# Patient Record
Sex: Male | Born: 1960 | Race: Black or African American | Hispanic: No | Smoking: Current every day smoker
Health system: Southern US, Community
[De-identification: ages and names within clinical notes are randomized; demographics above are authoritative.]

## PROBLEM LIST (undated history)

## (undated) DIAGNOSIS — K219 Gastro-esophageal reflux disease without esophagitis: Secondary | ICD-10-CM

## (undated) DIAGNOSIS — M25559 Pain in unspecified hip: Secondary | ICD-10-CM

## (undated) DIAGNOSIS — M1990 Unspecified osteoarthritis, unspecified site: Secondary | ICD-10-CM

## (undated) DIAGNOSIS — F319 Bipolar disorder, unspecified: Secondary | ICD-10-CM

## (undated) DIAGNOSIS — F209 Schizophrenia, unspecified: Secondary | ICD-10-CM

## (undated) DIAGNOSIS — F329 Major depressive disorder, single episode, unspecified: Secondary | ICD-10-CM

## (undated) DIAGNOSIS — M549 Dorsalgia, unspecified: Secondary | ICD-10-CM

## (undated) DIAGNOSIS — G8929 Other chronic pain: Secondary | ICD-10-CM

## (undated) DIAGNOSIS — J189 Pneumonia, unspecified organism: Secondary | ICD-10-CM

## (undated) DIAGNOSIS — I739 Peripheral vascular disease, unspecified: Secondary | ICD-10-CM

## (undated) DIAGNOSIS — Z720 Tobacco use: Secondary | ICD-10-CM

## (undated) DIAGNOSIS — F32A Depression, unspecified: Secondary | ICD-10-CM

## (undated) DIAGNOSIS — I7090 Unspecified atherosclerosis: Secondary | ICD-10-CM

## (undated) DIAGNOSIS — R51 Headache: Secondary | ICD-10-CM

## (undated) HISTORY — DX: Schizophrenia, unspecified: F20.9

## (undated) HISTORY — PX: EYE SURGERY: SHX253

## (undated) HISTORY — PX: MANDIBLE FRACTURE SURGERY: SHX706

## (undated) HISTORY — PX: JOINT REPLACEMENT: SHX530

## (undated) HISTORY — PX: FRACTURE SURGERY: SHX138

## (undated) HISTORY — PX: ESOPHAGEAL DILATION: SHX303

## (undated) HISTORY — DX: Bipolar disorder, unspecified: F31.9

## (undated) SURGERY — EGD (ESOPHAGOGASTRODUODENOSCOPY)
Anesthesia: Moderate Sedation

---

## 1998-09-25 ENCOUNTER — Encounter: Payer: Self-pay | Admitting: Emergency Medicine

## 1998-09-25 ENCOUNTER — Emergency Department (HOSPITAL_COMMUNITY): Admission: EM | Admit: 1998-09-25 | Discharge: 1998-09-25 | Payer: Self-pay | Admitting: Emergency Medicine

## 2000-09-23 ENCOUNTER — Emergency Department (HOSPITAL_COMMUNITY): Admission: EM | Admit: 2000-09-23 | Discharge: 2000-09-23 | Payer: Self-pay | Admitting: Emergency Medicine

## 2000-12-16 ENCOUNTER — Emergency Department (HOSPITAL_COMMUNITY): Admission: EM | Admit: 2000-12-16 | Discharge: 2000-12-16 | Payer: Self-pay | Admitting: Emergency Medicine

## 2002-02-01 ENCOUNTER — Inpatient Hospital Stay (HOSPITAL_COMMUNITY): Admission: EM | Admit: 2002-02-01 | Discharge: 2002-02-02 | Payer: Self-pay | Admitting: Emergency Medicine

## 2002-02-01 ENCOUNTER — Encounter: Payer: Self-pay | Admitting: Emergency Medicine

## 2002-09-23 ENCOUNTER — Encounter: Payer: Self-pay | Admitting: Emergency Medicine

## 2002-09-23 ENCOUNTER — Emergency Department (HOSPITAL_COMMUNITY): Admission: EM | Admit: 2002-09-23 | Discharge: 2002-09-23 | Payer: Self-pay | Admitting: Emergency Medicine

## 2003-01-01 ENCOUNTER — Emergency Department (HOSPITAL_COMMUNITY): Admission: EM | Admit: 2003-01-01 | Discharge: 2003-01-01 | Payer: Self-pay | Admitting: Emergency Medicine

## 2003-02-25 ENCOUNTER — Emergency Department (HOSPITAL_COMMUNITY): Admission: EM | Admit: 2003-02-25 | Discharge: 2003-02-25 | Payer: Self-pay | Admitting: Emergency Medicine

## 2004-07-27 ENCOUNTER — Emergency Department (HOSPITAL_COMMUNITY): Admission: EM | Admit: 2004-07-27 | Discharge: 2004-07-27 | Payer: Self-pay | Admitting: Emergency Medicine

## 2004-08-10 ENCOUNTER — Encounter: Admission: RE | Admit: 2004-08-10 | Discharge: 2004-11-08 | Payer: Self-pay | Admitting: Orthopedic Surgery

## 2008-01-27 ENCOUNTER — Emergency Department (HOSPITAL_COMMUNITY): Admission: EM | Admit: 2008-01-27 | Discharge: 2008-01-27 | Payer: Self-pay | Admitting: Emergency Medicine

## 2008-02-03 ENCOUNTER — Emergency Department (HOSPITAL_COMMUNITY): Admission: EM | Admit: 2008-02-03 | Discharge: 2008-02-03 | Payer: Self-pay | Admitting: Emergency Medicine

## 2008-04-18 ENCOUNTER — Emergency Department (HOSPITAL_COMMUNITY): Admission: EM | Admit: 2008-04-18 | Discharge: 2008-04-18 | Payer: Self-pay | Admitting: Emergency Medicine

## 2008-05-02 ENCOUNTER — Emergency Department (HOSPITAL_COMMUNITY): Admission: EM | Admit: 2008-05-02 | Discharge: 2008-05-02 | Payer: Self-pay | Admitting: Family Medicine

## 2008-06-03 ENCOUNTER — Ambulatory Visit: Payer: Self-pay | Admitting: Internal Medicine

## 2008-06-05 ENCOUNTER — Ambulatory Visit: Payer: Self-pay | Admitting: *Deleted

## 2008-07-09 ENCOUNTER — Ambulatory Visit: Payer: Self-pay | Admitting: Family Medicine

## 2008-08-12 ENCOUNTER — Ambulatory Visit: Payer: Self-pay | Admitting: Internal Medicine

## 2008-08-26 ENCOUNTER — Inpatient Hospital Stay (HOSPITAL_COMMUNITY): Admission: RE | Admit: 2008-08-26 | Discharge: 2008-08-30 | Payer: Self-pay | Admitting: Orthopaedic Surgery

## 2009-03-18 ENCOUNTER — Ambulatory Visit: Payer: Self-pay | Admitting: Internal Medicine

## 2009-03-20 ENCOUNTER — Ambulatory Visit (HOSPITAL_COMMUNITY): Admission: RE | Admit: 2009-03-20 | Discharge: 2009-03-20 | Payer: Self-pay | Admitting: Internal Medicine

## 2009-04-02 ENCOUNTER — Ambulatory Visit: Payer: Self-pay | Admitting: Internal Medicine

## 2009-04-02 LAB — CONVERTED CEMR LAB
CO2: 27 meq/L (ref 19–32)
Cholesterol: 182 mg/dL (ref 0–200)
Creatinine, Ser: 1.48 mg/dL (ref 0.40–1.50)
Eosinophils Absolute: 0.1 10*3/uL (ref 0.0–0.7)
Eosinophils Relative: 2 % (ref 0–5)
Glucose, Bld: 85 mg/dL (ref 70–99)
HCT: 45.1 % (ref 39.0–52.0)
LDL Cholesterol: 121 mg/dL — ABNORMAL HIGH (ref 0–99)
MCHC: 33.3 g/dL (ref 30.0–36.0)
Monocytes Absolute: 0.6 10*3/uL (ref 0.1–1.0)
Neutro Abs: 4.2 10*3/uL (ref 1.7–7.7)
Platelets: 237 10*3/uL (ref 150–400)
Potassium: 4 meq/L (ref 3.5–5.3)
RBC: 4.72 M/uL (ref 4.22–5.81)
RDW: 14.1 % (ref 11.5–15.5)
Sodium: 142 meq/L (ref 135–145)

## 2009-04-09 ENCOUNTER — Ambulatory Visit: Payer: Self-pay | Admitting: Internal Medicine

## 2009-05-26 ENCOUNTER — Ambulatory Visit: Payer: Self-pay | Admitting: Internal Medicine

## 2010-04-17 LAB — BASIC METABOLIC PANEL
CO2: 26 mEq/L (ref 19–32)
CO2: 27 mEq/L (ref 19–32)
CO2: 31 mEq/L (ref 19–32)
Calcium: 8.7 mg/dL (ref 8.4–10.5)
Chloride: 103 mEq/L (ref 96–112)
Chloride: 104 mEq/L (ref 96–112)
Creatinine, Ser: 1.33 mg/dL (ref 0.4–1.5)
Creatinine, Ser: 1.47 mg/dL (ref 0.4–1.5)
Creatinine, Ser: 1.55 mg/dL — ABNORMAL HIGH (ref 0.4–1.5)
GFR calc Af Amer: >60 mL/min (ref >60)
GFR calc Af Amer: >60 mL/min (ref >60)
GFR calc non Af Amer: 45 mL/min — ABNORMAL LOW (ref >60)
GFR calc non Af Amer: 48 mL/min — ABNORMAL LOW (ref >60)
Glucose, Bld: 140 mg/dL — ABNORMAL HIGH (ref 70–99)
Potassium: 3.8 mEq/L (ref 3.5–5.1)
Potassium: 4.1 mEq/L (ref 3.5–5.1)
Sodium: 142 mEq/L (ref 135–145)

## 2010-04-17 LAB — URINALYSIS, ROUTINE W REFLEX MICROSCOPIC
Glucose, UA: NEGATIVE mg/dL
Nitrite: NEGATIVE
Specific Gravity, Urine: 1.019 (ref 1.005–1.030)
pH: 6 (ref 5.0–8.0)

## 2010-04-17 LAB — CBC
HCT: 33.1 % — ABNORMAL LOW (ref 39.0–52.0)
HCT: 37.7 % — ABNORMAL LOW (ref 39.0–52.0)
MCHC: 34.3 g/dL (ref 30.0–36.0)
MCHC: 34.3 g/dL (ref 30.0–36.0)
MCHC: 34.4 g/dL (ref 30.0–36.0)
MCV: 98.9 fL (ref 78.0–100.0)
Platelets: 264 10*3/uL (ref 150–400)
RBC: 3.81 MIL/uL — ABNORMAL LOW (ref 4.22–5.81)
RBC: 4.12 MIL/uL — ABNORMAL LOW (ref 4.22–5.81)
RDW: 15.1 % (ref 11.5–15.5)
WBC: 11.6 10*3/uL — ABNORMAL HIGH (ref 4.0–10.5)
WBC: 7.2 10*3/uL (ref 4.0–10.5)
WBC: 8.4 10*3/uL (ref 4.0–10.5)

## 2010-04-17 LAB — APTT: aPTT: 29 seconds (ref 24–37)

## 2010-04-17 LAB — PROTIME-INR
INR: 3.4 — ABNORMAL HIGH (ref 0.00–1.49)
Prothrombin Time: 18.8 seconds — ABNORMAL HIGH (ref 11.6–15.2)
Prothrombin Time: 34.3 seconds — ABNORMAL HIGH (ref 11.6–15.2)

## 2010-04-17 LAB — GLUCOSE, CAPILLARY: Glucose-Capillary: 102 mg/dL — ABNORMAL HIGH (ref 70–99)

## 2010-05-25 NOTE — Discharge Summary (Signed)
NAMERAPHEAL, MASSO             ACCOUNT NO.:  [id number]   MEDICAL RECORD NO.:  [id number]          PATIENT TYPE:  INP   LOCATION:  5033                         FACILITY:  [facility]   PHYSICIAN:  Vanita Panda. Magnus Ivan, M.D.DATE OF BIRTH:  1960/01/12   DATE OF ADMISSION:  08/26/2008  DATE OF DISCHARGE:  08/30/2008                               DISCHARGE SUMMARY   ADMITTING DIAGNOSIS:  Avascular necrosis, right hip.   DISCHARGE DIAGNOSIS:  Avascular necrosis, right hip.   PROCEDURE:  Right total hip arthroplasty on August 26, 2008.   HOSPITAL COURSE:  Mr. Vancott is a 50 year old gentleman with severe  avascular necrosis involving his right hip with collapse of the femoral  head.  He had debilitating pain in his hip with difficulty with  ambulating and activities of daily living.  It was recommended that he  undergo a total hip arthroplasty.  The risks and benefits of the surgery  were explained to him in detailed.  He was taken to the operating room  on the day admission where he underwent a right total hip replacement  without complications.   His postoperative course.  He was admitted to orthopedic floor bed and  had a complicated postoperative convalescence in an acute care bed.  By  day of discharge, he was tolerating oral pain medicines as well as oral  diet.  He was ambulating well and then cleared from a standpoint.  It  was felt, he could be discharged safely to home.   DISPOSITION:  To home.   DISCHARGE INSTRUCTIONS:  While he is at home, he will continue to weight  bear as tolerated on both of his hips with anterior to anterior hip  precautions.  Home health will come and work with therapy as well as lab  draws for his Coumadin dosing.   FOLLOWUP:  Will be reestablished in my office in 2 weeks.   DISCHARGE MEDICATIONS:  1. Percocet.  2. Robaxin.  3. Integralin.      Vanita Panda. Magnus Ivan, M.D.  Electronically Signed     CYB/MEDQ  D:  08/30/2008  T:   08/30/2008  Job:  [id number]

## 2010-05-25 NOTE — Op Note (Signed)
James, Stevens             ACCOUNT NO.:  [id number]   MEDICAL RECORD NO.:  [id number]          PATIENT TYPE:  INP   LOCATION:  5033                         FACILITY:  [facility]   PHYSICIAN:  Vanita Panda. Magnus Ivan, M.D.DATE OF BIRTH:  1960/06/17   DATE OF PROCEDURE:  08/26/2008  DATE OF DISCHARGE:                               OPERATIVE REPORT   PREOPERATIVE DIAGNOSIS:  Right hip avascular necrosis.   POSTOPERATIVE DIAGNOSIS:  Right hip avascular necrosis.   PROCEDURE:  Right total hip arthroplasty.   IMPLANTS:  DePuy Pinnacle acetabular component size 52 with 3 screw  holes and 1 screw placed, size 36 +1.5 hip ball, Corail DePuy  hydroxyapatite-coated femoral stem with a high offset size 12.   SURGEON:  Vanita Panda. Magnus Ivan, MD   ASSISTANT:  Wende Neighbors, PA-C   ANESTHESIA:  General.   ANTIBIOTICS:  Ancef 1 g IV.   BLOOD LOSS:  300 mL.   COMPLICATIONS:  None.   INDICATIONS:  Briefly, James Stevens is a 50 year old gentleman with  bipolar disorder and who was likely an alcoholic at one point.  He  developed debilitating hip pain for the last 3 years involving his right  hip.  X-rays were obtained and were consistent with severe avascular  necrosis.  He was seen by another surgeon in town who had wanted to send  him to [facility] for definitive  treatment.  He came to me asking if he could just have this treatment  done in town.  I talked to him in length about a hip replacement surgery  and what this would involve giving his severe avascular necrosis that is  at end stage.  I explained the risks and benefits of surgery as well as  pluses and minuses of the bearing surfaces.  We selected to proceed with  total hip arthroplasty.   PROCEDURE:  After informed consent was obtained and appropriate right  hip was marked, he was brought to the operating room, placed supine on  the operating table.  General anesthesia was then  obtained.  A Foley  catheter was placed, and he was turned into a lateral decubitus position  with the right operative hip up with appropriate padding under the down  nonoperative leg as well as his arms and axillary roll was placed.  Appropriate positioning was placed for his head and neck.  His right  operative extremity was then prepped and draped with DuraPrep and  sterile drapes including sterile leg bag.  A time-out was called to  identify the correct patient and correct right hip.  I then made a small  incision directly over the greater trochanter, carried this proximally  and distally.  I was able to dissect down to the iliotibial band.  I did  divide this in a longitudinal fashion, and a Charnley retractor was  placed.  Thus, when I pursued an anterolateral approach to the hip.  I  took down approximately two-thirds of gluteus medius and minimus tendons  off of the greater trochanter and reflected these anteriorly.  I was  able to easily dissect  down to the hip capsule.  I divided the hip  capsule in a T-type format and placed tacking sutures in the capsule.  I  then was able to easily dislocate the hip and the leg was placed in the  anterior leg bag.  I then made a saw cut approximately a fingerbreadth  proximal to the greater trochanter and removed the head.  The head was  very much diseased with loss of cartilage and flattening and soft bone.  I then cleaned the acetabular debris and placed Hohmann retractors  anterior and posterior.  I removed the labrum as well.  I then started  with a size 45 reamer and reamed medially after reams of 45 and 47 and  as I said we had got into the teardrop area.  I then directed my reamers  sequentially next with the appropriate version placed.  I reamed up to a  size 52 and I trialed a 52 acetabular component.  I then placed the real  Pinnacle Septra cup and placed 1 screw hole.  I did feel this cup bottom  out and was felt to be in a good  position with appropriate version.  I  got good bite with the single screw that was placed in a posterior-  superior direction.  Next, the size 52 metal liner was placed for metal-  on-metal component.  Attention was then turned to the femoral component.  A retractor was placed underneath the greater trochanter and the leg was  flexed and anteriorly rotated off the table into the leg bag.  I used  the cookie cutter to enter the femoral canal as well as the canal  finding guide and applied lateralizing reamer one time.  I then chose  the Corail Broaching System and I was able to broach from size 8 up to a  size 12.  The size 12 was felt to be appropriate and I selected my  version as well.  I then trialed a size 36 +1.5 hip ball and reduced  this into the acetabulum.  It had no shuck and excellent fit and range  of motion.  I could not dislocate them anteriorly as well and this did  not felt too tight.  His leg lengths were felt about to be equal.  I  then trialed once hip ball size up and one below this to get an idea of  leg lengths and I still felt this was appropriate length of the 1.5 and  36 head.  I then removed all trial components until I fully irrigated  the tissues.  I placed the real Corail size 12 HA-coated stem with a  collar.  I then trialed the 1.5 size 36 hip ball again and this was felt  to have a secure fit, so I placed the real hip ball and reduced this  into the acetabulum.  Again, range of motion was felt to be full.  I  could not dislocate them posteriorly or anteriorly, especially  anteriorly without much external rotation.  Again, the leg lengths were  felt to be equal.  We then thoroughly irrigated the wounds.  I closed  the hip capsule with interrupted #1 Ethibond suture.  I reapproximated  the gluteus medius and minimus to the vastus lateralis and oversewed  this with #1 Ethibond suture as well.  The iliotibial band was closed  with interrupted 4-0 nylon with  interrupted #1 Vicryl suture, followed  by this with 0 Vicryl in the deep  tissue, 2-0 Vicryl in the subcutaneous  tissue and running 4-0 Vicryl subcuticular stitch.  Steri-Strips were  placed as well, and a well-padded sterile dressing was applied.  The  patient was then rolled in a supine position, awakened, and extubated.  He was taken to the recovery room in stable condition.      Vanita Panda. Magnus Ivan, M.D.  Electronically Signed     CYB/MEDQ  D:  08/26/2008  T:  08/27/2008  Job:  [id number]

## 2010-05-28 NOTE — Op Note (Signed)
NAME:  James Stevens, James Stevens                       ACCOUNT NO.:  [id number]   MEDICAL RECORD NO.:  [id number]                   PATIENT TYPE:  INP   LOCATION:  1825                                 FACILITY:  [facility]   PHYSICIAN:  Hinton Dyer, D.D.S.            DATE OF BIRTH:  [birth year]   DATE OF PROCEDURE:  DATE OF DISCHARGE:                                 OPERATIVE REPORT   PREOPERATIVE DIAGNOSES:  Fracture of right condylar neck of mandible.   POSTOPERATIVE DIAGNOSES:  Fracture of right condylar neck of mandible.   PROCEDURE:  Closed reduction of fracture of right condylar neck of mandible  with Erich arch bars and intermaxillary fixation.   ANESTHESIA:  General.   SURGEON:  Royston Sinner   ESTIMATED BLOOD LOSS:  Less than 10 mL.   CONDITION IN SURGERY:  Good.   BRIEF HISTORY:  The patient allegedly fell this afternoon and had trouble  opening his mouth after that. He was seen in the [facility] emergency room  and x-rays revealed the fracture with displacement of the right condylar  neck of the mandible. He was worked up and then brought to the operating  room and placed in the supine position, where he remained throughout the  whole procedure. He was then intubated by right nasal endotracheal tube and  then prepped and draped in the usual fashion for an intraoral procedure.  After prepping the face and mouth with Betadine scrub and paint, and the  throat pack was placed around the endotracheal tube, the maxillary arch was  measured from tooth #3 to tooth #14. It was noted that he had marked  periodontal disease, although the teeth were all intact, so care was taken  to use 24 gauge stainless steel wires, around teeth #3, 4, 5, 6, 7, 8, 9,  10, 11, 12, 13 and 14 and around the [place] arch bar. They were turned in a  clockwise fashion and rosetted down into the mucobuccal fold. This  stabilized the maxillary arch bar and attention was then turned to the  mandible, where  tooth #19 through 30 were measured and then [facility] arch bar  cut to fit the arch, 24 gauge stainless steel wire was passed around teeth  #19, 20, 21, 22, 23, 24, 25, 26, 27, 28, 29, and 30 and then around the  [place] arch bar. These wires were then rosetted down in a clockwise direction  and down into the mucobuccal fold. The Erich arch bar was stabilized and  then the throat pack was removed. The mouth was irrigated out and suctioned,  and the patient was placed in what appeared to be his most appropriate  occlusion. It was found that he was in a class III malocclusion but the  teeth were interdigitated appropriately. Four maxillary fixation wires were  used to hold and stabilize the maximal width of the mandible and the  mandible was manipulated so as  to reduce the fracture as best as possible.  The wires were then cut, turned clockwise, and then he was extubated on the  table and returned to recovery room in good condition. He will be seen by me  later tomorrow morning and be discharged home as appropriate.                                                Hinton Dyer, D.D.S.   Lynden Ang  D:  02/01/2002  T:  02/02/2002  Job:  [id number]

## 2010-06-28 ENCOUNTER — Inpatient Hospital Stay (HOSPITAL_COMMUNITY)
Admission: EM | Admit: 2010-06-28 | Discharge: 2010-06-29 | DRG: 153 | Disposition: A | Discharge status: Home / Self Care | Payer: Medicaid Other | Attending: Internal Medicine | Admitting: Internal Medicine

## 2010-06-28 ENCOUNTER — Encounter (HOSPITAL_COMMUNITY): Payer: Self-pay | Admitting: Radiology

## 2010-06-28 ENCOUNTER — Emergency Department (HOSPITAL_COMMUNITY): Payer: Medicaid Other

## 2010-06-28 DIAGNOSIS — M1990 Unspecified osteoarthritis, unspecified site: Secondary | ICD-10-CM | POA: Diagnosis present

## 2010-06-28 DIAGNOSIS — F209 Schizophrenia, unspecified: Secondary | ICD-10-CM | POA: Diagnosis present

## 2010-06-28 DIAGNOSIS — Z96649 Presence of unspecified artificial hip joint: Secondary | ICD-10-CM

## 2010-06-28 DIAGNOSIS — F17200 Nicotine dependence, unspecified, uncomplicated: Secondary | ICD-10-CM | POA: Diagnosis present

## 2010-06-28 DIAGNOSIS — J039 Acute tonsillitis, unspecified: Principal | ICD-10-CM | POA: Diagnosis present

## 2010-06-28 DIAGNOSIS — E860 Dehydration: Secondary | ICD-10-CM | POA: Diagnosis present

## 2010-06-28 DIAGNOSIS — Z23 Encounter for immunization: Secondary | ICD-10-CM

## 2010-06-28 DIAGNOSIS — F1410 Cocaine abuse, uncomplicated: Secondary | ICD-10-CM | POA: Diagnosis present

## 2010-06-28 DIAGNOSIS — J36 Peritonsillar abscess: Secondary | ICD-10-CM | POA: Diagnosis present

## 2010-06-28 LAB — DIFFERENTIAL
Eosinophils Relative: 1 % (ref 0–5)
Lymphocytes Relative: 17 % (ref 12–46)
Lymphs Abs: 2.2 10*3/uL (ref 0.7–4.0)
Monocytes Relative: 10 % (ref 3–12)
Neutrophils Relative %: 72 % (ref 43–77)

## 2010-06-28 LAB — POCT I-STAT, CHEM 8
BUN: 20 mg/dL (ref 6–23)
Calcium, Ion: 1.12 mmol/L (ref 1.12–1.32)
Chloride: 103 mEq/L (ref 96–112)
Glucose, Bld: 85 mg/dL (ref 70–99)
HCT: 53 % — ABNORMAL HIGH (ref 39.0–52.0)
Potassium: 4.3 mEq/L (ref 3.5–5.1)

## 2010-06-28 LAB — CBC
HCT: 47.3 % (ref 39.0–52.0)
Hemoglobin: 17 g/dL (ref 13.0–17.0)
MCV: 95.9 fL (ref 78.0–100.0)
WBC: 13 10*3/uL — ABNORMAL HIGH (ref 4.0–10.5)

## 2010-06-28 MED ORDER — IOHEXOL 300 MG/ML  SOLN
80.0000 mL | Freq: Once | INTRAMUSCULAR | Status: AC | PRN
  Administered 2010-06-28: 80 mL via INTRAVENOUS

## 2010-06-29 LAB — CBC
HCT: 41.1 % (ref 39.0–52.0)
MCH: 33.3 pg (ref 26.0–34.0)
MCV: 94.9 fL (ref 78.0–100.0)
RBC: 4.33 MIL/uL (ref 4.22–5.81)
WBC: 7.6 10*3/uL (ref 4.0–10.5)

## 2010-06-29 LAB — BASIC METABOLIC PANEL
BUN: 16 mg/dL (ref 6–23)
CO2: 25 mEq/L (ref 19–32)
Chloride: 105 mEq/L (ref 96–112)
Creatinine, Ser: 1.04 mg/dL (ref 0.50–1.35)
Glucose, Bld: 96 mg/dL (ref 70–99)

## 2010-06-29 NOTE — H&P (Signed)
[name], James Stevens             ACCOUNT NO.:  [id number]  MEDICAL RECORD NO.:  [id number]  LOCATION:  [facility]                         FACILITY:  [facility]  PHYSICIAN:  Jeoffrey Massed, MD    DATE OF BIRTH:  07/08/1960  DATE OF ADMISSION:  06/28/2010 DATE OF DISCHARGE:                             HISTORY & PHYSICAL   PRIMARY CARE PHYSICIAN:  Fleet Contras, MD  CHIEF COMPLAINT:  Fever and sore throat for 4-5 days.  HISTORY OF PRESENT ILLNESS:  The patient is a very pleasant 50 year old male with a history of schizophrenia, history of tobacco abuse, history of on and off cocaine use as well comes in the above-noted complaints. Per the patient, he started having some sore throat last Friday.  Over the course of the weekend, it gradually worsened to the extent that he was not able to swallow regular food and was drinking mostly liquids. He also noted that he was having subjective fever with chills.  Since his difficulty swallowing progressed, he presented to the ED today for further evaluation.  In the ED, further workup was done which included a CT of the neck, which shows a left-sided tonsillitis with a peritonsillar abscess.  The ED physician and the PA did talk to ENT on call, Dr. Jearld Fenton who requested a medical admission and he suggested trial of antibiotics before considering drainage of the abscess as the abscess was deep.  Dr. Jearld Fenton will evaluate formally in the morning.  Currently during my evaluation, the patient claims that he already feels much better after getting IV antibiotics in the ED.  He claims that he is able to open his mouth much more than what he was able to do with this morning.  He is able to control his secretions.  There is no stridor. He is not short of breath.  He has been afebrile here in the emergency room as well.  The hospitalist service is now admitting this patient for IV antibiotics.  ALLERGIES:  Questionable allergy to IBUPROFEN.  PAST MEDICAL  HISTORY: 1. Avascular necrosis of his right hip status post hip repair, a hip     replacement. 2. Degenerative joint disease. 3. Schizophrenia, maintained on Seroquel. 4. Tobacco abuse. 5. On and off cocaine abuse.  PAST SURGICAL HISTORY:  Status post right hip arthroplasty in 2010, done by Dr. Doneen Poisson.  FAMILY HISTORY:  Noncontributory to current condition.  MEDICATIONS AT HOME:  Seroquel at an unknown dose.  SOCIAL HISTORY:  Lives by himself, smokes around half a pack of cigarettes a day.  Smokes crack cocaine once in a while.  Socially, drinks alcohol.  REVIEW OF SYSTEMS:  A detailed review of 12 systems was done and these are negative except for the ones mentioned in the HPI.  PHYSICAL EXAMINATION:  GENERAL:  The patient lying in bed, does not appear to be in any distress.  Speech clear.  No stridor.  No drooling. He is awake and alert. HEENT: Atraumatic, normocephalic.  Pupils equally reactive to light and accommodation.  Oral cavity, the patient does have some mild-to-moderate to marked trismus but claims that he is able to open his mouth much more.  Evaluation of the oral cavity shows  a numerous dental caries. There is marked erythema in the bilateral tonsillar area.  Unable to evaluate much more than that because of the patient's trismus.  No stridor heard. NECK:  Supple. CHEST:  Bilaterally clear to auscultation. CARDIOVASCULAR:  Heart sounds are regular.  No murmurs heard. ABDOMEN:  Soft, nontender, nondistended. EXTREMITIES:  No edema. NEUROLOGIC:  The patient is awake and alert and has no focal neurological deficits.  LABORATORY DATA: 1. CBC shows a WBC of 13, hemoglobin of 17, hematocrit of 47.3,     platelet count of 202.  Differential shows atypical lymphocytes. 2. Chemistry shows a sodium of 138, potassium of 4.3, creatinine of     1.40, BUN of 20, glucose of 85, chloride of 103. 3. [place] strep screen is negative. 4. Mono screen is negative  as well.  RADIOLOGICAL STUDIES:  CT of the neck shows enlarged left tonsillar pillar containing crescentic fluid collection with surrounding hyperenhancement and left level II lymphadenopathy.  Fevers, infectious tonsillitis with intratonsillar abscess.  Hyper enhancement of the left submandibular gland, fever-associated silo adenitis.  Subtle hypodensity surrounding the dystrophic calcification to the right tonsil may reflect early suppuration as well.  ASSESSMENT: 1. Left-sided tonsillitis with a possible peritonsillar abscess. 2. Ongoing tobacco abuse. 3. History of cocaine abuse. 4. History of schizophrenia, currently stable.  PLAN: 1. The patient be admitted to regular floor. 2. We will start him on IV Unasyn and clindamycin for now and can be     further narrowed as the patient's clinical situation improves. 3. Currently, the patient does not have any drooling.  The patient     does not have any stridor on exam.  He appears easily able to protect      his airway.  He will be admitted to the medical service for IV      antibiotics with ENT consulting in the morning.      Per my conversation with the emergency department     physician assistant who actually talked to Dr. Jearld Fenton, ENT on-call     recommendations are for a trial of IV antibiotics as the     abscess is deep and if it does not respond to IV antibiotics, then     a drainage will be pursued. 4. The patient will be monitored closely and his clinical course will     be followed to see if he responds to IV antibiotics or not. 5. Await ENT evaluation for further recommendations. 6. Substance abuse and tobacco cessation consultation will be     obtained.  I have also counselled him extensively and the patient     claims he will see if he can quit. 7. Code status.  Full code. 8. DVT prophylaxis with Lovenox.  Total time spent equals 60 minutes.     Jeoffrey Massed, MD     SG/MEDQ  D:  06/28/2010  T:  06/28/2010   Job:  [id number]  cc:   Fleet Contras, M.D.  Electronically Signed by Jeoffrey Massed  on 06/29/2010 06:10:51 AM

## 2010-06-30 LAB — DRUGS OF ABUSE SCREEN W/O ALC, ROUTINE URINE
Benzodiazepines.: NEGATIVE
Cocaine Metabolites: POSITIVE — AB
Methadone: NEGATIVE
Opiate Screen, Urine: POSITIVE — AB
Phencyclidine (PCP): NEGATIVE

## 2010-07-02 NOTE — Discharge Summary (Signed)
NAMEMARGARET, STAGGS             ACCOUNT NO.:  [id number]  MEDICAL RECORD NO.:  [id number]  LOCATION:  5506                         FACILITY:  [facility]  PHYSICIAN:  Thad Ranger, MD       DATE OF BIRTH:  1960/04/27  DATE OF ADMISSION:  06/28/2010 DATE OF DISCHARGE:  06/29/2010                        DISCHARGE SUMMARY - REFERRING   PRIMARY CARE PHYSICIAN:  Fleet Contras, MD  DISCHARGE DIAGNOSES: 1. Left-sided tonsillitis with possible peritonsillar abscess. 2. Dehydration. 3. History of schizophrenia. 4. History of substance abuse. 5. History of nicotine abuse. 6. History of degenerative joint disease. 7. History of avascular necrosis of the right hip, status post hip     repair.  CONSULTATIONS:  Suzanna Obey, MD, ENT  DISCHARGE MEDICATIONS: 1. Augmentin 875 mg 1 tablet p.o. b.i.d. for 14 days. 2. Vicodin 5/325 mg 1-2 tablets every 6 hours as needed for pain. 3. Seroquel 300 mg 0.5 tablet at noon and 1.5 tablet at bedtime twice    daily.  BRIEF HISTORY OF PRESENT ILLNESS:  At the time of admission, Mr. PACHOLSKI is a 50 year old African American with history of schizophrenia, tobacco abuse, history of off and on cocaine use, presented with fever and sore throat for 4-5 days.  The patient stated that he was started having some sort throat for 4 days and over the course of weekend, it gradually worsened to the extent that he was not able to swallow regular food and was drinking mostly liquid.  He also noted to have some subjective fevers with chills as his difficulty swallowing progress he presented to the emergency room.  In the ED, further workup was done which included CT of the neck which showed left- sided tonsillitis with peritonsillar abscess.  The patient was admitted by the Hospitalist Service.  RADIOLOGICAL DATA: 1. CT of the neck and one enlarged left tonsillar pillar containing     the crescentic fluid collection with surrounding hyperenhancement     in the left  level 2.  Lymphadenopathy, favor infectious colitis     with left intratonsillar abscess.  No parapharyngeal or     retropharyngeal space involvement.  If clinical findings do not     support infection, necrotic tumor should be considered. 2. Hyperenhancement of the left mandibular gland 3. Subtle hypodensity surrounding and dystrophic calcification in the     right tonsil may reflect only suppuration as well.  PERTINENT LAB DIAGNOSTIC DATA:  Rapid strep screen was negative.  CBC at the time of admission showed white count 13.0, hemoglobin 17, hematocrit 47.3, platelets 202.  CBC at the time of discharge showed white count 7.6, hemoglobin 14.4, hematocrit 41.1, and platelets 216.  BRIEF HOSPITALIZATION COURSE:  Mr. Fini is a 50 year old who presented with left-sided tonsillitis with peritonsillar abscess. 1. Left-sided tonsillitis with possible peritonsillar abscess.  The     patient was admitted to the medical floor.  He was started on IV     Unasyn and clindamycin.  ENT consultation was obtained.  The     patient did not have any stridor on exam or any drooling and was     protecting his airway.  The patient was seen by Dr. Jearld Fenton from ENT  Service today and per recommendation felt that the patient may have     spontaneously ruptured and he was taking p.o. well.  From her ENT     recommendations, the patient was cleared to be discharged home with     antibiotic course.  The patient will follow up with Dr. Jearld Fenton in 7     days in his office.  The patient has been taking liquid diet very     well and will be started on mechanical soft diet.  He was counseled     strongly to avoid raw fruit,vegetables and salads for the next few days.  The     patient will be discharged home today after the current antibiotic     pack is finished.  PHYSICAL EXAMINATION:  VITAL SIGNS:  At the time of dictation, temperature 97.9, pulse 62, respirations 22, and blood pressure 106/70. GENERAL:  He is  alert, awake and oriented x3, not in any acute distress. HEENT:  Anicteric.  Conjunctivae normal.  PERRLA.  Pupils reactive to light and accommodation.  EOMI. NECK:  Supple. CHEST:  Clear to auscultation bilaterally. ABDOMEN:  Soft, nontender, nondistended, normal bowel sounds. EXTREMITIES:  No edema.  DISCHARGE FOLLOWUP:  Dr. Suzanna Obey, ENT within 7 days.  Discharge time 35 minutes.     Thad Ranger, MD     RR/MEDQ  D:  06/29/2010  T:  06/29/2010  Job:  [id number]  cc:   Fleet Contras, M.D. Suzanna Obey, M.D.  Electronically Signed by Andres Labrum Ashawnti Tangen  on 07/02/2010 05:10:02 PM

## 2010-07-05 LAB — COCAINE, URINE, CONFIRMATION: Benzoylecgonine GC/MS Conf: 6718 NG/ML — ABNORMAL HIGH

## 2010-07-05 LAB — OPIATE, QUANTITATIVE, URINE
Codeine Urine: NEGATIVE NG/ML
Oxycodone, ur: NEGATIVE NG/ML
Oxymorphone: NEGATIVE NG/ML

## 2010-11-26 ENCOUNTER — Ambulatory Visit: Payer: Medicaid Other | Admitting: Physical Therapy

## 2011-06-28 ENCOUNTER — Other Ambulatory Visit: Payer: Self-pay | Admitting: Gastroenterology

## 2011-06-28 DIAGNOSIS — R1310 Dysphagia, unspecified: Secondary | ICD-10-CM

## 2011-07-06 ENCOUNTER — Inpatient Hospital Stay: Admission: RE | Admit: 2011-07-06 | Payer: Medicaid Other | Source: Ambulatory Visit

## 2011-07-15 ENCOUNTER — Ambulatory Visit
Admission: RE | Admit: 2011-07-15 | Discharge: 2011-07-15 | Disposition: A | Discharge status: Home / Self Care | Payer: Medicaid Other | Source: Ambulatory Visit | Attending: Gastroenterology | Admitting: Gastroenterology

## 2011-07-15 DIAGNOSIS — R1310 Dysphagia, unspecified: Secondary | ICD-10-CM

## 2011-08-23 ENCOUNTER — Emergency Department (HOSPITAL_COMMUNITY)
Admission: EM | Admit: 2011-08-23 | Discharge: 2011-08-23 | Disposition: A | Discharge status: Home / Self Care | Payer: Medicaid Other | Attending: Emergency Medicine | Admitting: Emergency Medicine

## 2011-08-23 ENCOUNTER — Encounter (HOSPITAL_COMMUNITY): Payer: Self-pay | Admitting: *Deleted

## 2011-08-23 DIAGNOSIS — IMO0002 Reserved for concepts with insufficient information to code with codable children: Secondary | ICD-10-CM

## 2011-08-23 DIAGNOSIS — Z96649 Presence of unspecified artificial hip joint: Secondary | ICD-10-CM | POA: Insufficient documentation

## 2011-08-23 DIAGNOSIS — S61209A Unspecified open wound of unspecified finger without damage to nail, initial encounter: Secondary | ICD-10-CM | POA: Insufficient documentation

## 2011-08-23 DIAGNOSIS — F17200 Nicotine dependence, unspecified, uncomplicated: Secondary | ICD-10-CM | POA: Insufficient documentation

## 2011-08-23 DIAGNOSIS — W260XXA Contact with knife, initial encounter: Secondary | ICD-10-CM | POA: Insufficient documentation

## 2011-08-23 DIAGNOSIS — W261XXA Contact with sword or dagger, initial encounter: Secondary | ICD-10-CM | POA: Insufficient documentation

## 2011-08-23 NOTE — ED Notes (Signed)
Accidentally cut left thumb when sharping a knife.  No bleeding

## 2011-08-23 NOTE — ED Provider Notes (Signed)
History    This chart was scribed for Rolan Bucco, MD, MD by Smitty Pluck. The patient was seen in room [facility] and the patient's care was started at 7:00PM.   CSN: [id number]  Arrival date & time 08/23/11  1633   First MD Initiated Contact with Patient 08/23/11 1859      Chief Complaint  Patient presents with  . Laceration    left thumb    (Consider location/radiation/quality/duration/timing/severity/associated sxs/prior treatment) Patient is a 51 y.o. male presenting with skin laceration. The history is provided by the patient.  Laceration    James Stevens is a 51 y.o. male who presents to the Emergency Department complaining of moderate left thumb laceration today at 5PM. Pt reports using knife and accidentally cutting his thumb. Bleeding is controlled. Denies any radiation. Denies any other pain.   History reviewed. No pertinent past medical history.  Past Surgical History  Procedure Date  . Joint replacement     rt hip replacement    No family history on file.  History  Substance Use Topics  . Smoking status: Current Everyday Smoker  . Smokeless tobacco: Not on file  . Alcohol Use: Yes     occ      Review of Systems  Constitutional: Negative for fever.  HENT: Negative for neck pain.   Respiratory: Negative.   Cardiovascular: Negative.   Gastrointestinal: Negative for nausea and vomiting.  Musculoskeletal: Negative for back pain and joint swelling.  Skin: Negative for wound.  Neurological: Negative for headaches.    Allergies  Ibuprofen and Tramadol  Home Medications   Current Outpatient Rx  Name Route Sig Dispense Refill  . HYDROCODONE-ACETAMINOPHEN 10-325 MG PO TABS Oral Take 1 tablet by mouth every 8 (eight) hours as needed. For pain      BP 109/79  Pulse 79  Temp 97.3 F (36.3 C) (Oral)  Resp 16  Ht 5\' 10"  (1.778 m)  Wt 147 lb (66.679 kg)  BMI 21.09 kg/m2  SpO2 98%  Physical Exam  Nursing note and vitals reviewed. Constitutional:  He is oriented to person, place, and time. He appears well-developed and well-nourished.  HENT:  Head: Normocephalic and atraumatic.  Neck: Normal range of motion. Neck supple.       No pain to neck or back  Musculoskeletal: He exhibits no edema and no tenderness.       1 cm superficial laceration to volar surface of thumb Overlying distal phalanx Able to flex and extend normally Normal sensation and cap refill is normal   Neurological: He is alert and oriented to person, place, and time.    ED Course  LACERATION REPAIR Date/Time: 08/23/2011 7:42 PM Performed by: Atalya Dano Authorized by: Rolan Bucco Consent: Verbal consent obtained. Consent given by: patient Body area: upper extremity Location details: left thumb Foreign bodies: no foreign bodies Tendon involvement: none Nerve involvement: none Vascular damage: no Irrigation solution: saline Amount of cleaning: standard Skin closure: glue Approximation: close Approximation difficulty: simple   (including critical care time) DIAGNOSTIC STUDIES: Oxygen Saturation is 98% on room air, normal by my interpretation.    COORDINATION OF CARE: 7:02PM EDP discusses pt ED treatment with pt     Labs Reviewed - No data to display No results found.   1. Laceration       MDM  Pt with superficial laceration to thumb.  TDAP UTD, given wound care instructions      I personally performed the services described in this documentation, which  was scribed in my presence.  The recorded information has been reviewed and considered.    Rolan Bucco, MD 08/23/11 [phone]

## 2011-08-23 NOTE — ED Notes (Signed)
The patient is AOx4 and comfortable with his discharge instructions. 

## 2011-09-06 ENCOUNTER — Encounter (HOSPITAL_COMMUNITY): Payer: Self-pay | Admitting: *Deleted

## 2011-09-06 ENCOUNTER — Ambulatory Visit (HOSPITAL_COMMUNITY): Payer: Medicaid Other

## 2011-09-06 ENCOUNTER — Encounter (HOSPITAL_COMMUNITY)
Admission: RE | Disposition: A | Discharge status: Home / Self Care | Payer: Self-pay | Source: Ambulatory Visit | Attending: Gastroenterology

## 2011-09-06 ENCOUNTER — Ambulatory Visit (HOSPITAL_COMMUNITY)
Admission: RE | Admit: 2011-09-06 | Discharge: 2011-09-06 | Disposition: A | Discharge status: Home / Self Care | Payer: Medicaid Other | Source: Ambulatory Visit | Attending: Gastroenterology | Admitting: Gastroenterology

## 2011-09-06 DIAGNOSIS — F17200 Nicotine dependence, unspecified, uncomplicated: Secondary | ICD-10-CM | POA: Insufficient documentation

## 2011-09-06 DIAGNOSIS — R1310 Dysphagia, unspecified: Secondary | ICD-10-CM | POA: Insufficient documentation

## 2011-09-06 DIAGNOSIS — Z96649 Presence of unspecified artificial hip joint: Secondary | ICD-10-CM | POA: Insufficient documentation

## 2011-09-06 HISTORY — DX: Other chronic pain: G89.29

## 2011-09-06 HISTORY — PX: SAVORY DILATION: SHX5439

## 2011-09-06 HISTORY — PX: ESOPHAGOGASTRODUODENOSCOPY: SHX5428

## 2011-09-06 HISTORY — DX: Dorsalgia, unspecified: M54.9

## 2011-09-06 SURGERY — EGD (ESOPHAGOGASTRODUODENOSCOPY)
Anesthesia: Moderate Sedation

## 2011-09-06 MED ORDER — BUTAMBEN-TETRACAINE-BENZOCAINE 2-2-14 % EX AERO
INHALATION_SPRAY | CUTANEOUS | Status: DC | PRN
  Administered 2011-09-06: 2 via TOPICAL

## 2011-09-06 MED ORDER — FENTANYL CITRATE 0.05 MG/ML IJ SOLN
INTRAMUSCULAR | Status: DC | PRN
  Administered 2011-09-06 (×2): 25 ug via INTRAVENOUS

## 2011-09-06 MED ORDER — MIDAZOLAM HCL 10 MG/2ML IJ SOLN
INTRAMUSCULAR | Status: DC | PRN
  Administered 2011-09-06: 2 mg via INTRAVENOUS
  Administered 2011-09-06: 1 mg via INTRAVENOUS
  Administered 2011-09-06: 2 mg via INTRAVENOUS

## 2011-09-06 MED ORDER — MIDAZOLAM HCL 10 MG/2ML IJ SOLN
INTRAMUSCULAR | Status: AC
  Filled 2011-09-06: qty 4

## 2011-09-06 MED ORDER — FENTANYL CITRATE 0.05 MG/ML IJ SOLN
INTRAMUSCULAR | Status: AC
  Filled 2011-09-06: qty 4

## 2011-09-06 MED ORDER — DIPHENHYDRAMINE HCL 50 MG/ML IJ SOLN
INTRAMUSCULAR | Status: AC
  Filled 2011-09-06: qty 1

## 2011-09-06 MED ORDER — SODIUM CHLORIDE 0.9 % IV SOLN
INTRAVENOUS | Status: DC
  Administered 2011-09-06: 09:00:00 via INTRAVENOUS

## 2011-09-06 NOTE — H&P (Signed)
  Subjective:   Patient is a 51 y.o. male presents with dysphagia and trouble swallowing for dilatation. Procedure including risks and benefits discussed in office.  There are no active problems to display for this patient.  Past Medical History  Diagnosis Date  . Chronic back pain     and right hip    Past Surgical History  Procedure Date  . Joint replacement     rt hip replacement  . Mandible fracture surgery     Prescriptions prior to admission  Medication Sig Dispense Refill  . HYDROcodone-acetaminophen (NORCO) 10-325 MG per tablet Take 1 tablet by mouth every 8 (eight) hours as needed. For pain       Allergies  Allergen Reactions  . Ibuprofen   . Tramadol     History  Substance Use Topics  . Smoking status: Current Everyday Smoker -- 0.5 packs/day  . Smokeless tobacco: Not on file  . Alcohol Use: 1.8 oz/week    3 Cans of beer per week     3x a week     History reviewed. No pertinent family history.   Objective:   Patient Vitals for the past 8 hrs:  BP Temp Temp src Resp SpO2 Height Weight  09/06/11 0841 124/72 mmHg 97.8 F (36.6 C) Oral 18  98 % 5\' 10"  (1.778 m) 67.132 kg (148 lb)         See MD Preop evaluation      Assesment: Esophageal stricture  Plan:   For dilatation discussed in the office.

## 2011-09-06 NOTE — Op Note (Signed)
[facility] [address] [place], [address]   ENDOSCOPY PROCEDURE REPORT  PATIENT: James Stevens, James Stevens.  MR#: [id number] BIRTHDATE: 1960-11-20 , 51  yrs. old GENDER: Male ENDOSCOPIST:Markese Bloxham Randa Evens, MD REFERRED BY: Fleet Contras PROCEDURE DATE:  09/06/2011 PROCEDURE:   EGD, diagnostic and Savary dilation of esophagus ASA CLASS:    Class II INDICATIONS: dysphagia. MEDICATION: Fentanyl 50 mcg IV, Versed 5 mg IV, and Cetacaine spray x 2 2 TOPICAL ANESTHETIC:   Cetacaine Spray  DESCRIPTION OF PROCEDURE:   After the risks and benefits of the procedure were explained, informed consent was obtained.  The Pentax Gastroscope [id number]  endoscope was introduced through the mouth  and advanced to the second portion of the duodenum .  The instrument was slowly withdrawn as the mucosa was fully examined.      ESOPHAGUS: The mucosa of the esophagus appeared normal.  Minimal stricture.   this was dilated using fluoroscopic guidance with 14 mm dilator. Small amount heme with withdrawal of the styloid or.  STOMACH: The mucosa of the stomach appeared normal.  DUODENUM: The duodenal mucosa showed no abnormalities. Retroflexed views revealed no abnormalities.    The scope was then withdrawn from the patient and the procedure completed.  COMPLICATIONS: There were no complications.   ENDOSCOPIC IMPRESSION: 1.   The mucosa of the esophagus appeared normal 2.   Minimal stricture,dilated to 14 mm using fluoroscopic guidance  RECOMMENDATIONS: routine postdilatation orders with clear liquids for 6 hours and resume soft diet tomorrow.  If the patient has any further problems he will call the office.  He will continue current medications.    _______________________________ Rosalie DoctorCarman Ching, MD 09/06/2011 9:56 AM   standard discharge clear liquids et Karie Soda for 6 hours soft food tomorrow. CC: Dr Tyson Dense

## 2011-09-07 ENCOUNTER — Encounter (HOSPITAL_COMMUNITY): Payer: Self-pay | Admitting: Gastroenterology

## 2011-09-07 ENCOUNTER — Encounter (HOSPITAL_COMMUNITY): Payer: Self-pay

## 2011-09-08 ENCOUNTER — Emergency Department (HOSPITAL_COMMUNITY): Payer: Medicaid Other

## 2011-09-08 ENCOUNTER — Encounter (HOSPITAL_COMMUNITY): Payer: Self-pay

## 2011-09-08 ENCOUNTER — Emergency Department (HOSPITAL_COMMUNITY)
Admission: EM | Admit: 2011-09-08 | Discharge: 2011-09-08 | Disposition: A | Discharge status: Home / Self Care | Payer: Medicaid Other | Attending: Emergency Medicine | Admitting: Emergency Medicine

## 2011-09-08 DIAGNOSIS — S0100XA Unspecified open wound of scalp, initial encounter: Secondary | ICD-10-CM | POA: Insufficient documentation

## 2011-09-08 DIAGNOSIS — T07XXXA Unspecified multiple injuries, initial encounter: Secondary | ICD-10-CM

## 2011-09-08 DIAGNOSIS — G8929 Other chronic pain: Secondary | ICD-10-CM | POA: Insufficient documentation

## 2011-09-08 DIAGNOSIS — F17200 Nicotine dependence, unspecified, uncomplicated: Secondary | ICD-10-CM | POA: Insufficient documentation

## 2011-09-08 MED ORDER — OXYCODONE-ACETAMINOPHEN 5-325 MG PO TABS
1.0000 | ORAL_TABLET | Freq: Once | ORAL | Status: AC
  Administered 2011-09-08: 1 via ORAL
  Filled 2011-09-08: qty 1

## 2011-09-08 MED ORDER — LIDOCAINE HCL 2 % IJ SOLN
5.0000 mL | Freq: Once | INTRAMUSCULAR | Status: AC
  Administered 2011-09-08: 5 mL via INTRADERMAL
  Filled 2011-09-08: qty 1

## 2011-09-08 MED ORDER — PERCOCET 5-325 MG PO TABS: 1.0000 | ORAL_TABLET | Freq: Four times a day (QID) | ORAL | Status: DC | PRN

## 2011-09-08 NOTE — ED Notes (Signed)
[employer] at bedside at this time. 

## 2011-09-08 NOTE — ED Notes (Signed)
Lisette, PA in to suture.
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
## 2011-09-08 NOTE — ED Notes (Signed)
Lisette, PA back in to finish suturing

## 2011-09-08 NOTE — ED Notes (Signed)
Pt taken to xray 

## 2011-09-09 NOTE — ED Provider Notes (Signed)
Medical screening examination/treatment/procedure(s) were performed by non-physician practitioner and as supervising physician I was immediately available for consultation/collaboration.  Jones Skene, M.D.     Jones Skene, MD 09/09/11 1610

## 2011-09-13 ENCOUNTER — Encounter (HOSPITAL_COMMUNITY): Payer: Self-pay | Admitting: *Deleted

## 2011-09-13 ENCOUNTER — Emergency Department (HOSPITAL_COMMUNITY)
Admission: EM | Admit: 2011-09-13 | Discharge: 2011-09-13 | Discharge status: Against Medical Advice | Payer: Medicaid Other | Attending: Emergency Medicine | Admitting: Emergency Medicine

## 2011-09-13 DIAGNOSIS — M79609 Pain in unspecified limb: Secondary | ICD-10-CM | POA: Insufficient documentation

## 2011-09-13 DIAGNOSIS — M7989 Other specified soft tissue disorders: Secondary | ICD-10-CM | POA: Insufficient documentation

## 2011-09-13 NOTE — ED Notes (Signed)
Pt stated he couldn't wait any more and would come back another time, risks explained to patient. Pt ambulated to exit. No distress noted.
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
## 2011-09-19 ENCOUNTER — Emergency Department (HOSPITAL_COMMUNITY)
Admission: EM | Admit: 2011-09-19 | Discharge: 2011-09-19 | Disposition: A | Discharge status: Home / Self Care | Payer: Medicaid Other | Attending: Emergency Medicine | Admitting: Emergency Medicine

## 2011-09-19 ENCOUNTER — Emergency Department (INDEPENDENT_AMBULATORY_CARE_PROVIDER_SITE_OTHER)
Admission: EM | Admit: 2011-09-19 | Discharge: 2011-09-19 | Disposition: A | Discharge status: Home / Self Care | Payer: Medicaid Other | Source: Home / Self Care | Attending: Emergency Medicine | Admitting: Emergency Medicine

## 2011-09-19 ENCOUNTER — Encounter (HOSPITAL_COMMUNITY): Payer: Self-pay | Admitting: *Deleted

## 2011-09-19 DIAGNOSIS — F17200 Nicotine dependence, unspecified, uncomplicated: Secondary | ICD-10-CM | POA: Insufficient documentation

## 2011-09-19 DIAGNOSIS — S61409A Unspecified open wound of unspecified hand, initial encounter: Secondary | ICD-10-CM

## 2011-09-19 DIAGNOSIS — S61412A Laceration without foreign body of left hand, initial encounter: Secondary | ICD-10-CM

## 2011-09-19 DIAGNOSIS — Y849 Medical procedure, unspecified as the cause of abnormal reaction of the patient, or of later complication, without mention of misadventure at the time of the procedure: Secondary | ICD-10-CM | POA: Insufficient documentation

## 2011-09-19 DIAGNOSIS — T8133XA Disruption of traumatic injury wound repair, initial encounter: Secondary | ICD-10-CM | POA: Insufficient documentation

## 2011-09-19 DIAGNOSIS — G8929 Other chronic pain: Secondary | ICD-10-CM | POA: Insufficient documentation

## 2011-09-19 DIAGNOSIS — IMO0002 Reserved for concepts with insufficient information to code with codable children: Secondary | ICD-10-CM

## 2011-09-19 DIAGNOSIS — Z4802 Encounter for removal of sutures: Secondary | ICD-10-CM

## 2011-09-19 MED ORDER — DOXYCYCLINE HYCLATE 100 MG PO CAPS: 100.0000 mg | ORAL_CAPSULE | Freq: Two times a day (BID) | ORAL | Status: AC

## 2011-09-19 MED ORDER — BACITRACIN 500 UNIT/GM EX OINT
1.0000 "application " | TOPICAL_OINTMENT | Freq: Two times a day (BID) | CUTANEOUS | Status: DC
  Administered 2011-09-19: 1 via TOPICAL

## 2011-09-19 NOTE — ED Notes (Signed)
Sutures removed from left fingers, and hand soaked in clorahexadine
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
## 2011-09-19 NOTE — ED Provider Notes (Signed)
History     CSN: [id number]  Arrival date & time 09/19/11  1217   First MD Initiated Contact with Patient 09/19/11 1309      Chief Complaint  Patient presents with  . Suture / Staple Removal  . Wound Check    (Consider location/radiation/quality/duration/timing/severity/associated sxs/prior treatment) HPI  Past Medical History  Diagnosis Date  . Chronic back pain     and right hip    Past Surgical History  Procedure Date  . Joint replacement     rt hip replacement  . Mandible fracture surgery   . Esophagogastroduodenoscopy 09/06/2011    Procedure: ESOPHAGOGASTRODUODENOSCOPY (EGD);  Surgeon: Vertell Novak., MD;  Location: [facility];  Service: Endoscopy;  Laterality: N/A;  . Savory dilation 09/06/2011    Procedure: SAVORY DILATION;  Surgeon: Vertell Novak., MD;  Location: [facility];  Service: Endoscopy;  Laterality: N/A;  . Esophageal dilation     History reviewed. No pertinent family history.  History  Substance Use Topics  . Smoking status: Current Every Day Smoker -- 0.5 packs/day  . Smokeless tobacco: Not on file  . Alcohol Use: 1.8 oz/week    3 Cans of beer per week     3x a week       Review of Systems  Allergies  Ibuprofen and Tramadol  Home Medications   Current Outpatient Rx  Name Route Sig Dispense Refill  . DOXYCYCLINE HYCLATE 100 MG PO CAPS Oral Take 1 capsule (100 mg total) by mouth 2 (two) times daily. 20 capsule 0    BP 112/70  Pulse 89  Temp 98.1 F (36.7 C) (Oral)  Resp 16  SpO2 100%  Physical Exam  ED Course  Procedures (including critical care time)  Labs Reviewed - No data to display No results found.   1. Laceration of left hand with complication   2. Laceration of finger of left hand with complication   3. Encounter for staple removal       MDM          Linna Hoff, MD 09/23/11 1334
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
## 2011-09-19 NOTE — ED Notes (Signed)
Suture removal to left hand/fingers/stapel removal to head

## 2011-10-04 ENCOUNTER — Encounter (HOSPITAL_COMMUNITY): Payer: Self-pay | Admitting: Emergency Medicine

## 2011-10-04 ENCOUNTER — Emergency Department (HOSPITAL_COMMUNITY)
Admission: EM | Admit: 2011-10-04 | Discharge: 2011-10-05 | Disposition: A | Discharge status: Home / Self Care | Payer: Medicaid Other | Attending: Emergency Medicine | Admitting: Emergency Medicine

## 2011-10-04 DIAGNOSIS — X58XXXA Exposure to other specified factors, initial encounter: Secondary | ICD-10-CM | POA: Insufficient documentation

## 2011-10-04 DIAGNOSIS — T148XXA Other injury of unspecified body region, initial encounter: Secondary | ICD-10-CM

## 2011-10-04 DIAGNOSIS — Z96649 Presence of unspecified artificial hip joint: Secondary | ICD-10-CM | POA: Insufficient documentation

## 2011-10-04 DIAGNOSIS — S61412A Laceration without foreign body of left hand, initial encounter: Secondary | ICD-10-CM

## 2011-10-04 DIAGNOSIS — S61409A Unspecified open wound of unspecified hand, initial encounter: Secondary | ICD-10-CM | POA: Insufficient documentation

## 2011-10-04 DIAGNOSIS — F17200 Nicotine dependence, unspecified, uncomplicated: Secondary | ICD-10-CM | POA: Insufficient documentation

## 2011-10-04 NOTE — ED Notes (Signed)
PT. REPORTS NON HEALING LACERATIONS AT LEFT 4TH AND 5TH FINGER SUTURED HERE LAST AUG. 28 , STATES DRAINAGE WITH NUMBNESS .

## 2011-10-05 MED ORDER — SULFAMETHOXAZOLE-TRIMETHOPRIM 800-160 MG PO TABS: 1.0000 | ORAL_TABLET | Freq: Two times a day (BID) | ORAL | Status: DC

## 2011-10-05 MED ORDER — HYDROCODONE-ACETAMINOPHEN 5-325 MG PO TABS
1.0000 | ORAL_TABLET | Freq: Once | ORAL | Status: AC
  Administered 2011-10-05: 1 via ORAL
  Filled 2011-10-05: qty 1

## 2011-10-05 MED ORDER — HYDROCODONE-ACETAMINOPHEN 10-325 MG PO TABS: 1.0000 | ORAL_TABLET | Freq: Four times a day (QID) | ORAL | Status: DC | PRN

## 2011-10-05 MED ORDER — CEPHALEXIN 500 MG PO CAPS: 500.0000 mg | ORAL_CAPSULE | Freq: Four times a day (QID) | ORAL | Status: DC

## 2011-10-05 NOTE — ED Provider Notes (Signed)
Medical screening examination/treatment/procedure(s) were performed by non-physician practitioner and as supervising physician I was immediately available for consultation/collaboration.    Jentry Warnell D Michaeljohn Biss, MD 10/05/11 1603 
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
## 2012-01-20 ENCOUNTER — Emergency Department (HOSPITAL_COMMUNITY)
Admission: EM | Admit: 2012-01-20 | Discharge: 2012-01-20 | Disposition: A | Discharge status: Home / Self Care | Payer: Medicaid Other | Attending: Emergency Medicine | Admitting: Emergency Medicine

## 2012-01-20 ENCOUNTER — Emergency Department (HOSPITAL_COMMUNITY): Payer: Medicaid Other

## 2012-01-20 ENCOUNTER — Encounter (HOSPITAL_COMMUNITY): Payer: Self-pay | Admitting: Emergency Medicine

## 2012-01-20 DIAGNOSIS — F17200 Nicotine dependence, unspecified, uncomplicated: Secondary | ICD-10-CM | POA: Insufficient documentation

## 2012-01-20 DIAGNOSIS — Z9889 Other specified postprocedural states: Secondary | ICD-10-CM | POA: Insufficient documentation

## 2012-01-20 DIAGNOSIS — Z96649 Presence of unspecified artificial hip joint: Secondary | ICD-10-CM | POA: Insufficient documentation

## 2012-01-20 DIAGNOSIS — M7660 Achilles tendinitis, unspecified leg: Secondary | ICD-10-CM

## 2012-01-20 DIAGNOSIS — M25559 Pain in unspecified hip: Secondary | ICD-10-CM | POA: Insufficient documentation

## 2012-01-20 DIAGNOSIS — R011 Cardiac murmur, unspecified: Secondary | ICD-10-CM | POA: Insufficient documentation

## 2012-01-20 DIAGNOSIS — M25579 Pain in unspecified ankle and joints of unspecified foot: Secondary | ICD-10-CM | POA: Insufficient documentation

## 2012-01-20 DIAGNOSIS — G8929 Other chronic pain: Secondary | ICD-10-CM | POA: Insufficient documentation

## 2012-01-20 DIAGNOSIS — M549 Dorsalgia, unspecified: Secondary | ICD-10-CM | POA: Insufficient documentation

## 2012-01-20 MED ORDER — HYDROCODONE-ACETAMINOPHEN 5-325 MG PO TABS: 2.0000 | ORAL_TABLET | ORAL | Status: DC | PRN

## 2012-01-20 MED ORDER — OXYCODONE-ACETAMINOPHEN 5-325 MG PO TABS
1.0000 | ORAL_TABLET | Freq: Once | ORAL | Status: AC
  Administered 2012-01-20: 1 via ORAL
  Filled 2012-01-20: qty 1

## 2012-01-20 MED ORDER — KETOROLAC TROMETHAMINE 60 MG/2ML IM SOLN
60.0000 mg | Freq: Once | INTRAMUSCULAR | Status: AC
Start: 2012-01-20 — End: 2012-01-20
  Administered 2012-01-20: 60 mg via INTRAMUSCULAR
  Filled 2012-01-20: qty 2

## 2012-01-20 MED ORDER — NAPROXEN 500 MG PO TABS: 500.0000 mg | ORAL_TABLET | Freq: Two times a day (BID) | ORAL | Status: DC

## 2012-01-20 NOTE — ED Notes (Signed)
Toe hurting x 4 days when he fell this am it got worse

## 2012-01-20 NOTE — ED Notes (Signed)
Patient transported to X-ray 

## 2012-01-20 NOTE — ED Notes (Signed)
C/o severe right 5th toe pain. No deformity noted.

## 2012-01-20 NOTE — ED Notes (Signed)
Larey Seat this am and hurt  Rt little toe and hip pt can walk w/ cane has of replacement on rt side

## 2012-01-20 NOTE — ED Notes (Signed)
Patient provided sandwich and orange juice.

## 2012-01-20 NOTE — ED Notes (Signed)
Pt transported to xray 

## 2012-01-20 NOTE — ED Notes (Signed)
Pt b

## 2012-01-20 NOTE — ED Provider Notes (Signed)
History   This chart was scribed for non-physician practitioner working with James Roots, MD by Frederik Pear, ED Scribe. This patient was seen in room TR06C/TR06C and the patient's care was started at 1533.   CSN: [id number]  Arrival date & time 01/20/12  1313   First MD Initiated Contact with Patient 01/20/12 1533      No chief complaint on file.   (Consider location/radiation/quality/duration/timing/severity/associated sxs/prior treatment) Patient is a 52 y.o. male presenting with toe pain.  Toe Pain This is a new problem. The current episode started more than 2 days ago. The problem occurs constantly. The problem has been gradually worsening. Exacerbated by: bearing weight. He has tried nothing for the symptoms.    James Stevens is a 52 y.o. male who presents to the Emergency Department complaining of constant, gradually worsening, burning pain to his right little toe that is aggravated by bearing weight and began 4 days ago and worsened after he fell this morning. He reports that he had a right-sided hip replacement by Dr. Magnus Ivan 2 years ago with some complications during the healing process. He states that he was ambulatory with his cane after the fall.   Past Medical History  Diagnosis Date  . Chronic back pain     and right hip    Past Surgical History  Procedure Date  . Joint replacement     rt hip replacement  . Mandible fracture surgery   . Esophagogastroduodenoscopy 09/06/2011    Procedure: ESOPHAGOGASTRODUODENOSCOPY (EGD);  Surgeon: Vertell Novak., MD;  Location: [facility];  Service: Endoscopy;  Laterality: N/A;  . Savory dilation 09/06/2011    Procedure: SAVORY DILATION;  Surgeon: Vertell Novak., MD;  Location: [facility];  Service: Endoscopy;  Laterality: N/A;  . Esophageal dilation     No family history on file.  History  Substance Use Topics  . Smoking status: Current Every Day Smoker -- 0.5 packs/day  . Smokeless tobacco: Not on file   . Alcohol Use: 1.8 oz/week    3 Cans of beer per week     Comment: 3x a week       Review of Systems  Musculoskeletal:       Toe pain  All other systems reviewed and are negative.    Allergies  Ibuprofen and Tramadol  Home Medications  No current outpatient prescriptions on file.  BP 147/87  Pulse 79  Temp 97.3 F (36.3 C) (Oral)  Resp 22  SpO2 95%  Physical Exam  Nursing note and vitals reviewed. Constitutional: He is oriented to person, place, and time. He appears well-developed and well-nourished. No distress.  HENT:  Head: Normocephalic and atraumatic.  Eyes: EOM are normal.  Neck: Neck supple. No tracheal deviation present.  Cardiovascular: Normal rate and regular rhythm.   Murmur heard. Pulmonary/Chest: Effort normal and breath sounds normal. No respiratory distress.  Abdominal: Soft. Bowel sounds are normal. There is no tenderness.  Musculoskeletal: He exhibits tenderness.       He is tender along the achilles tendon.  Neurological: He is alert and oriented to person, place, and time.  Skin: Skin is warm and dry.  Psychiatric: He has a normal mood and affect. His behavior is normal.    ED Course  Procedures (including critical care time)  DIAGNOSTIC STUDIES: Oxygen Saturation is 99% on room air, normal by my interpretation.    COORDINATION OF CARE:  15:39- Discussed planned course of treatment with the patient, including a right  foot and ankle X-ray, who is agreeable at this time.  16:00- Medication Orders- oxycodone-acetaminophen (percocet/roxicet) 5-325 mg per tablet 1 tablet- once.  17:00- Medication Orders- ketorolac (toradol) injection 60 mg- once.  Labs Reviewed - No data to display Dg Ankle Complete Right  01/20/2012  *RADIOLOGY REPORT*  Clinical Data: Larey Seat and injured right ankle.  Medial greater than lateral pain.  RIGHT ANKLE - COMPLETE 3+ VIEW  Comparison: None.  Findings: No evidence of acute fracture or dislocation.  Ankle mortise  intact with well-preserved joint space.  No intrinsic osseous abnormalities.  No evidence of a significant joint effusion.  IMPRESSION: Normal examination.   Original Report Authenticated By: Hulan Saas, M.D.    Dg Foot Complete Right  01/20/2012  *RADIOLOGY REPORT*  Clinical Data: Larey Seat and injured right foot, predominately lateral and mid foot pain.  RIGHT FOOT COMPLETE - 3+ VIEW  Comparison: None.  Findings: No evidence of acute fracture or dislocation.  Well- preserved joint spaces.  Well-preserved bone mineral density.  No intrinsic osseous abnormalities.  IMPRESSION: Normal examination.   Original Report Authenticated By: Hulan Saas, M.D.      No diagnosis found.  Good ROM of hip.  Tenderness to right achilles tendon and right forefoot.  No abnormalities noted on films.  Results shared with patient.  Crutches, rx for NSAID's, limited norco.  Has seen Dr. Magnus Ivan for his prior hip injury--will recommend follow-up with PCP and/or orthopedics.   MDM     I personally performed the services described in this documentation, which was scribed in my presence. The recorded information has been reviewed and is accurate.      James Norman, NP 01/21/12 [phone]

## 2012-01-20 NOTE — Progress Notes (Signed)
Orthopedic Tech Progress Note Patient Details:  James Stevens Sep 18, 1960 [id number]  Ortho Devices Type of Ortho Device: Crutches;Ace wrap Ortho Device/Splint Location: right foot Ortho Device/Splint Interventions: Application Viewed order from doctor's order list  Nikki Dom 01/20/2012, 5:15 PM

## 2012-01-22 NOTE — ED Provider Notes (Signed)
Medical screening examination/treatment/procedure(s) were performed by non-physician practitioner and as supervising physician I was immediately available for consultation/collaboration.   Aayana Reinertsen E Myrka Sylva, MD 01/22/12 1519 

## 2012-04-11 ENCOUNTER — Other Ambulatory Visit (HOSPITAL_COMMUNITY): Payer: Self-pay | Admitting: Orthopaedic Surgery

## 2012-04-12 ENCOUNTER — Encounter (HOSPITAL_COMMUNITY): Payer: Self-pay

## 2012-04-13 ENCOUNTER — Encounter (HOSPITAL_COMMUNITY): Payer: Self-pay

## 2012-04-13 ENCOUNTER — Encounter (HOSPITAL_COMMUNITY)
Admission: RE | Admit: 2012-04-13 | Discharge: 2012-04-13 | Disposition: A | Discharge status: Home / Self Care | Payer: Medicaid Other | Source: Ambulatory Visit | Attending: Orthopaedic Surgery | Admitting: Orthopaedic Surgery

## 2012-04-13 HISTORY — DX: Unspecified osteoarthritis, unspecified site: M19.90

## 2012-04-13 HISTORY — DX: Gastro-esophageal reflux disease without esophagitis: K21.9

## 2012-04-13 LAB — CBC
HCT: 42.3 % (ref 39.0–52.0)
Hemoglobin: 15 g/dL (ref 13.0–17.0)
MCHC: 35.5 g/dL (ref 30.0–36.0)

## 2012-04-13 LAB — BASIC METABOLIC PANEL
BUN: 15 mg/dL (ref 6–23)
Chloride: 106 mEq/L (ref 96–112)
GFR calc Af Amer: 69 mL/min — ABNORMAL LOW (ref >90)
Potassium: 3.9 mEq/L (ref 3.5–5.1)

## 2012-04-13 LAB — URINE MICROSCOPIC-ADD ON

## 2012-04-13 LAB — URINALYSIS, ROUTINE W REFLEX MICROSCOPIC
Glucose, UA: NEGATIVE mg/dL
Hgb urine dipstick: NEGATIVE
Protein, ur: NEGATIVE mg/dL
Specific Gravity, Urine: 1.011 (ref 1.005–1.030)

## 2012-04-13 LAB — TYPE AND SCREEN: Antibody Screen: NEGATIVE

## 2012-04-13 NOTE — Pre-Procedure Instructions (Addendum)
LEEUM SANKEY  04/13/2012   Your procedure is scheduled on:  04/17/12  Report to [facility] at 1100 AM.  Call this number if you have problems the morning of surgery: [phone]   Remember:   Do not eat food or drink liquids after midnight.   Take these medicines the morning of surgery with A SIP OF WATER: pain med   Do not wear jewelry, make-up or nail polish.  Do not wear lotions, powders, or perfumes. You may wear deodorant.  Do not shave 48 hours prior to surgery. Men may shave face and neck.  Do not bring valuables to the hospital.  Contacts, dentures or bridgework may not be worn into surgery.  Leave suitcase in the car. After surgery it may be brought to your room.  For patients admitted to the hospital, checkout time is 11:00 AM the day of  discharge.   Patients discharged the day of surgery will not be allowed to drive  home.  Name and phone number of your driver:   Special Instructions: Shower using CHG 2 nights before surgery and the night before surgery.  If you shower the day of surgery use CHG.  Use special wash - you have one bottle of CHG for all showers.  You should use approximately 1/3 of the bottle for each shower.   Please read over the following fact sheets that you were given: Pain Booklet, Coughing and Deep Breathing, Blood Transfusion Information, Total Joint Packet, MRSA Information and Surgical Site Infection Prevention

## 2012-04-14 LAB — URINE CULTURE

## 2012-04-16 MED ORDER — CEFAZOLIN SODIUM-DEXTROSE 2-3 GM-% IV SOLR
2.0000 g | INTRAVENOUS | Status: AC
  Administered 2012-04-17: 2 g via INTRAVENOUS
  Filled 2012-04-16: qty 50

## 2012-04-17 ENCOUNTER — Inpatient Hospital Stay (HOSPITAL_COMMUNITY): Payer: Medicaid Other

## 2012-04-17 ENCOUNTER — Encounter (HOSPITAL_COMMUNITY): Payer: Self-pay | Admitting: Anesthesiology

## 2012-04-17 ENCOUNTER — Inpatient Hospital Stay (HOSPITAL_COMMUNITY)
Admission: RE | Admit: 2012-04-17 | Discharge: 2012-04-20 | DRG: 470 | Disposition: A | Discharge status: Home Health | Payer: Medicaid Other | Source: Ambulatory Visit | Attending: Orthopaedic Surgery | Admitting: Orthopaedic Surgery

## 2012-04-17 ENCOUNTER — Encounter (HOSPITAL_COMMUNITY)
Admission: RE | Disposition: A | Discharge status: Home Health | Payer: Self-pay | Source: Ambulatory Visit | Attending: Orthopaedic Surgery

## 2012-04-17 ENCOUNTER — Inpatient Hospital Stay (HOSPITAL_COMMUNITY): Payer: Medicaid Other | Admitting: Anesthesiology

## 2012-04-17 DIAGNOSIS — M87052 Idiopathic aseptic necrosis of left femur: Secondary | ICD-10-CM

## 2012-04-17 DIAGNOSIS — Z96649 Presence of unspecified artificial hip joint: Secondary | ICD-10-CM

## 2012-04-17 DIAGNOSIS — M169 Osteoarthritis of hip, unspecified: Secondary | ICD-10-CM | POA: Diagnosis present

## 2012-04-17 DIAGNOSIS — M87059 Idiopathic aseptic necrosis of unspecified femur: Principal | ICD-10-CM

## 2012-04-17 DIAGNOSIS — M1610 Unilateral primary osteoarthritis, unspecified hip: Secondary | ICD-10-CM | POA: Diagnosis present

## 2012-04-17 DIAGNOSIS — Z886 Allergy status to analgesic agent status: Secondary | ICD-10-CM

## 2012-04-17 DIAGNOSIS — K219 Gastro-esophageal reflux disease without esophagitis: Secondary | ICD-10-CM | POA: Diagnosis present

## 2012-04-17 DIAGNOSIS — M129 Arthropathy, unspecified: Secondary | ICD-10-CM | POA: Diagnosis present

## 2012-04-17 DIAGNOSIS — G8929 Other chronic pain: Secondary | ICD-10-CM | POA: Diagnosis present

## 2012-04-17 DIAGNOSIS — F17200 Nicotine dependence, unspecified, uncomplicated: Secondary | ICD-10-CM | POA: Diagnosis present

## 2012-04-17 DIAGNOSIS — M549 Dorsalgia, unspecified: Secondary | ICD-10-CM | POA: Diagnosis present

## 2012-04-17 HISTORY — PX: TOTAL HIP ARTHROPLASTY: SHX124

## 2012-04-17 SURGERY — ARTHROPLASTY, HIP, TOTAL, ANTERIOR APPROACH
Anesthesia: General | Site: Hip | Laterality: Left | Wound class: Clean

## 2012-04-17 MED ORDER — PNEUMOCOCCAL VAC POLYVALENT 25 MCG/0.5ML IJ INJ
0.5000 mL | INJECTION | INTRAMUSCULAR | Status: AC
  Filled 2012-04-17: qty 0.5

## 2012-04-17 MED ORDER — LACTATED RINGERS IV SOLN
INTRAVENOUS | Status: DC | PRN
  Administered 2012-04-17 (×3): via INTRAVENOUS

## 2012-04-17 MED ORDER — HYDROMORPHONE HCL PF 1 MG/ML IJ SOLN
INTRAMUSCULAR | Status: AC
  Filled 2012-04-17: qty 1

## 2012-04-17 MED ORDER — VECURONIUM BROMIDE 10 MG IV SOLR
INTRAVENOUS | Status: DC | PRN
  Administered 2012-04-17: 2 mg via INTRAVENOUS
  Administered 2012-04-17 (×2): 1 mg via INTRAVENOUS

## 2012-04-17 MED ORDER — ONDANSETRON HCL 4 MG PO TABS: 4.0000 mg | ORAL_TABLET | Freq: Four times a day (QID) | ORAL | Status: DC | PRN

## 2012-04-17 MED ORDER — ACETAMINOPHEN 10 MG/ML IV SOLN
1000.0000 mg | Freq: Once | INTRAVENOUS | Status: AC
  Administered 2012-04-17: 1000 mg via INTRAVENOUS

## 2012-04-17 MED ORDER — MIDAZOLAM HCL 5 MG/5ML IJ SOLN
INTRAMUSCULAR | Status: DC | PRN
  Administered 2012-04-17: 2 mg via INTRAVENOUS

## 2012-04-17 MED ORDER — FERROUS SULFATE 325 (65 FE) MG PO TABS
325.0000 mg | ORAL_TABLET | Freq: Three times a day (TID) | ORAL | Status: DC
  Administered 2012-04-17 – 2012-04-19 (×7): 325 mg via ORAL
  Filled 2012-04-17 (×11): qty 1

## 2012-04-17 MED ORDER — MENTHOL 3 MG MT LOZG: 1.0000 | LOZENGE | OROMUCOSAL | Status: DC | PRN

## 2012-04-17 MED ORDER — ONDANSETRON HCL 4 MG/2ML IJ SOLN: 4.0000 mg | Freq: Four times a day (QID) | INTRAMUSCULAR | Status: DC | PRN

## 2012-04-17 MED ORDER — RIVAROXABAN 10 MG PO TABS
10.0000 mg | ORAL_TABLET | Freq: Every day | ORAL | Status: DC
  Administered 2012-04-18 – 2012-04-20 (×3): 10 mg via ORAL
  Filled 2012-04-17 (×4): qty 1

## 2012-04-17 MED ORDER — METHOCARBAMOL 500 MG PO TABS
500.0000 mg | ORAL_TABLET | Freq: Four times a day (QID) | ORAL | Status: DC | PRN
  Administered 2012-04-17 – 2012-04-20 (×6): 500 mg via ORAL
  Filled 2012-04-17 (×6): qty 1

## 2012-04-17 MED ORDER — DIPHENHYDRAMINE HCL 12.5 MG/5ML PO ELIX: 12.5000 mg | ORAL_SOLUTION | ORAL | Status: DC | PRN

## 2012-04-17 MED ORDER — ACETAMINOPHEN 10 MG/ML IV SOLN
INTRAVENOUS | Status: AC
  Filled 2012-04-17: qty 100

## 2012-04-17 MED ORDER — HYDROMORPHONE HCL PF 1 MG/ML IJ SOLN
1.0000 mg | INTRAMUSCULAR | Status: DC | PRN
  Administered 2012-04-17 – 2012-04-19 (×7): 1 mg via INTRAVENOUS
  Filled 2012-04-17 (×7): qty 1

## 2012-04-17 MED ORDER — METOCLOPRAMIDE HCL 5 MG/ML IJ SOLN: 5.0000 mg | Freq: Three times a day (TID) | INTRAMUSCULAR | Status: DC | PRN

## 2012-04-17 MED ORDER — SUFENTANIL CITRATE 50 MCG/ML IV SOLN
INTRAVENOUS | Status: DC | PRN
  Administered 2012-04-17: 5 ug via INTRAVENOUS
  Administered 2012-04-17: 10 ug via INTRAVENOUS
  Administered 2012-04-17 (×2): 5 ug via INTRAVENOUS

## 2012-04-17 MED ORDER — LIDOCAINE HCL (CARDIAC) 20 MG/ML IV SOLN
INTRAVENOUS | Status: DC | PRN
  Administered 2012-04-17: 80 mg via INTRAVENOUS

## 2012-04-17 MED ORDER — SODIUM CHLORIDE 0.9 % IV SOLN: INTRAVENOUS | Status: DC

## 2012-04-17 MED ORDER — ACETAMINOPHEN 650 MG RE SUPP: 650.0000 mg | Freq: Four times a day (QID) | RECTAL | Status: DC | PRN

## 2012-04-17 MED ORDER — ACETAMINOPHEN 325 MG PO TABS: 650.0000 mg | ORAL_TABLET | Freq: Four times a day (QID) | ORAL | Status: DC | PRN

## 2012-04-17 MED ORDER — METOCLOPRAMIDE HCL 10 MG PO TABS
5.0000 mg | ORAL_TABLET | Freq: Three times a day (TID) | ORAL | Status: DC | PRN
Start: 2012-04-17 — End: 2012-04-20

## 2012-04-17 MED ORDER — HYDROMORPHONE HCL PF 1 MG/ML IJ SOLN
0.2500 mg | INTRAMUSCULAR | Status: DC | PRN
  Administered 2012-04-17 (×4): 0.5 mg via INTRAVENOUS

## 2012-04-17 MED ORDER — ZOLPIDEM TARTRATE 5 MG PO TABS: 5.0000 mg | ORAL_TABLET | Freq: Every evening | ORAL | Status: DC | PRN

## 2012-04-17 MED ORDER — OXYCODONE HCL 5 MG/5ML PO SOLN: 5.0000 mg | Freq: Once | ORAL | Status: DC | PRN

## 2012-04-17 MED ORDER — CEFAZOLIN SODIUM 1-5 GM-% IV SOLN
1.0000 g | Freq: Four times a day (QID) | INTRAVENOUS | Status: AC
Start: 2012-04-17 — End: 2012-04-18
  Administered 2012-04-17 – 2012-04-18 (×2): 1 g via INTRAVENOUS
  Filled 2012-04-17 (×2): qty 50

## 2012-04-17 MED ORDER — PHENOL 1.4 % MT LIQD: 1.0000 | OROMUCOSAL | Status: DC | PRN

## 2012-04-17 MED ORDER — FENTANYL CITRATE 0.05 MG/ML IJ SOLN
INTRAMUSCULAR | Status: DC | PRN
  Administered 2012-04-17 (×2): 50 ug via INTRAVENOUS
  Administered 2012-04-17: 150 ug via INTRAVENOUS
  Administered 2012-04-17: 100 ug via INTRAVENOUS

## 2012-04-17 MED ORDER — ROCURONIUM BROMIDE 100 MG/10ML IV SOLN
INTRAVENOUS | Status: DC | PRN
  Administered 2012-04-17: 50 mg via INTRAVENOUS

## 2012-04-17 MED ORDER — OXYCODONE HCL 5 MG PO TABS
5.0000 mg | ORAL_TABLET | ORAL | Status: DC | PRN
  Administered 2012-04-17 (×2): 5 mg via ORAL
  Administered 2012-04-18 – 2012-04-20 (×12): 10 mg via ORAL
  Filled 2012-04-17 (×12): qty 2
  Filled 2012-04-17 (×2): qty 1

## 2012-04-17 MED ORDER — ALUM & MAG HYDROXIDE-SIMETH 200-200-20 MG/5ML PO SUSP: 30.0000 mL | ORAL | Status: DC | PRN

## 2012-04-17 MED ORDER — DOCUSATE SODIUM 100 MG PO CAPS
100.0000 mg | ORAL_CAPSULE | Freq: Two times a day (BID) | ORAL | Status: DC
  Administered 2012-04-17 – 2012-04-20 (×6): 100 mg via ORAL
  Filled 2012-04-17 (×6): qty 1

## 2012-04-17 MED ORDER — PROPOFOL 10 MG/ML IV BOLUS
INTRAVENOUS | Status: DC | PRN
  Administered 2012-04-17: 200 mg via INTRAVENOUS

## 2012-04-17 MED ORDER — OXYCODONE HCL ER 10 MG PO T12A
20.0000 mg | EXTENDED_RELEASE_TABLET | Freq: Two times a day (BID) | ORAL | Status: DC
  Administered 2012-04-17 – 2012-04-20 (×6): 20 mg via ORAL
  Filled 2012-04-17: qty 1
  Filled 2012-04-17: qty 2
  Filled 2012-04-17: qty 1
  Filled 2012-04-17 (×4): qty 2

## 2012-04-17 MED ORDER — METHOCARBAMOL 100 MG/ML IJ SOLN
500.0000 mg | Freq: Four times a day (QID) | INTRAMUSCULAR | Status: DC | PRN
  Filled 2012-04-17: qty 5

## 2012-04-17 MED ORDER — LACTATED RINGERS IV SOLN
INTRAVENOUS | Status: DC
  Administered 2012-04-17: 14:00:00 via INTRAVENOUS

## 2012-04-17 MED ORDER — OXYCODONE HCL 5 MG PO TABS: 5.0000 mg | ORAL_TABLET | Freq: Once | ORAL | Status: DC | PRN

## 2012-04-17 MED ORDER — ONDANSETRON HCL 4 MG/2ML IJ SOLN
INTRAMUSCULAR | Status: DC | PRN
  Administered 2012-04-17: 4 mg via INTRAVENOUS

## 2012-04-17 MED ORDER — 0.9 % SODIUM CHLORIDE (POUR BTL) OPTIME
TOPICAL | Status: DC | PRN
  Administered 2012-04-17: 1000 mL

## 2012-04-17 SURGICAL SUPPLY — 60 items
ADH SKN CLS APL DERMABOND .7 (GAUZE/BANDAGES/DRESSINGS) ×1
BANDAGE GAUZE ELAST BULKY 4 IN (GAUZE/BANDAGES/DRESSINGS) IMPLANT
BLADE SAW SGTL 18X1.27X75 (BLADE) ×2 IMPLANT
BLADE SURG ROTATE 9660 (MISCELLANEOUS) IMPLANT
BNDG COHESIVE 6X5 TAN STRL LF (GAUZE/BANDAGES/DRESSINGS) IMPLANT
CELLS DAT CNTRL 66122 CELL SVR (MISCELLANEOUS) ×1 IMPLANT
CLOTH BEACON ORANGE TIMEOUT ST (SAFETY) ×2 IMPLANT
COVER BACK TABLE 24X17X13 BIG (DRAPES) IMPLANT
COVER SURGICAL LIGHT HANDLE (MISCELLANEOUS) ×2 IMPLANT
DERMABOND ADVANCED (GAUZE/BANDAGES/DRESSINGS) ×1
DERMABOND ADVANCED .7 DNX12 (GAUZE/BANDAGES/DRESSINGS) IMPLANT
DRAPE C-ARM 42X72 X-RAY (DRAPES) ×2 IMPLANT
DRAPE STERI IOBAN 125X83 (DRAPES) ×2 IMPLANT
DRAPE U-SHAPE 47X51 STRL (DRAPES) ×6 IMPLANT
DRSG AQUACEL AG ADV 3.5X10 (GAUZE/BANDAGES/DRESSINGS) ×2 IMPLANT
DURAPREP 26ML APPLICATOR (WOUND CARE) ×2 IMPLANT
ELECT BLADE 4.0 EZ CLEAN MEGAD (MISCELLANEOUS)
ELECT BLADE TIP CTD 4 INCH (ELECTRODE) ×2 IMPLANT
ELECT CAUTERY BLADE 6.4 (BLADE) ×2 IMPLANT
ELECT REM PT RETURN 9FT ADLT (ELECTROSURGICAL) ×2
ELECTRODE BLDE 4.0 EZ CLN MEGD (MISCELLANEOUS) IMPLANT
ELECTRODE REM PT RTRN 9FT ADLT (ELECTROSURGICAL) ×1 IMPLANT
FACESHIELD LNG OPTICON STERILE (SAFETY) ×4 IMPLANT
GAUZE XEROFORM 1X8 LF (GAUZE/BANDAGES/DRESSINGS) ×2 IMPLANT
GLOVE BIO SURGEON STRL SZ7.5 (GLOVE) ×2 IMPLANT
GLOVE BIOGEL PI IND STRL 7.5 (GLOVE) ×1 IMPLANT
GLOVE BIOGEL PI IND STRL 8 (GLOVE) ×1 IMPLANT
GLOVE BIOGEL PI INDICATOR 7.5 (GLOVE) ×1
GLOVE BIOGEL PI INDICATOR 8 (GLOVE) ×1
GLOVE ECLIPSE 7.0 STRL STRAW (GLOVE) ×2 IMPLANT
GLOVE SURG ORTHO 8.0 STRL STRW (GLOVE) ×2 IMPLANT
GOWN PREVENTION PLUS LG XLONG (DISPOSABLE) IMPLANT
GOWN PREVENTION PLUS XLARGE (GOWN DISPOSABLE) ×2 IMPLANT
GOWN STRL NON-REIN LRG LVL3 (GOWN DISPOSABLE) ×4 IMPLANT
GOWN STRL REIN XL XLG (GOWN DISPOSABLE) ×2 IMPLANT
HANDPIECE INTERPULSE COAX TIP (DISPOSABLE) ×2
KIT BASIN OR (CUSTOM PROCEDURE TRAY) ×2 IMPLANT
KIT ROOM TURNOVER OR (KITS) ×2 IMPLANT
MANIFOLD NEPTUNE II (INSTRUMENTS) ×2 IMPLANT
NS IRRIG 1000ML POUR BTL (IV SOLUTION) ×2 IMPLANT
PACK TOTAL JOINT (CUSTOM PROCEDURE TRAY) ×2 IMPLANT
PAD ARMBOARD 7.5X6 YLW CONV (MISCELLANEOUS) ×4 IMPLANT
RETRACTOR WND ALEXIS 18 MED (MISCELLANEOUS) ×1 IMPLANT
RTRCTR WOUND ALEXIS 18CM MED (MISCELLANEOUS) ×2
SET HNDPC FAN SPRY TIP SCT (DISPOSABLE) ×1 IMPLANT
SPONGE LAP 18X18 X RAY DECT (DISPOSABLE) ×2 IMPLANT
SPONGE LAP 4X18 X RAY DECT (DISPOSABLE) IMPLANT
STAPLER SKIN PROX WIDE 3.9 (STAPLE) ×2 IMPLANT
STAPLER VISISTAT 35W (STAPLE) ×2 IMPLANT
SUT ETHIBOND NAB CT1 #1 30IN (SUTURE) ×4 IMPLANT
SUT VIC AB 0 CT1 27 (SUTURE) ×4
SUT VIC AB 0 CT1 27XBRD ANBCTR (SUTURE) ×2 IMPLANT
SUT VIC AB 1 CT1 27 (SUTURE) ×4
SUT VIC AB 1 CT1 27XBRD ANBCTR (SUTURE) ×2 IMPLANT
SUT VIC AB 2-0 CT1 27 (SUTURE) ×4
SUT VIC AB 2-0 CT1 TAPERPNT 27 (SUTURE) ×2 IMPLANT
TOWEL OR 17X24 6PK STRL BLUE (TOWEL DISPOSABLE) ×2 IMPLANT
TOWEL OR 17X26 10 PK STRL BLUE (TOWEL DISPOSABLE) ×2 IMPLANT
TRAY FOLEY CATH 14FR (SET/KITS/TRAYS/PACK) IMPLANT
WATER STERILE IRR 1000ML POUR (IV SOLUTION) ×4 IMPLANT

## 2012-04-17 NOTE — Plan of Care (Signed)
Problem: Consults Goal: Diagnosis- Total Joint Replacement Primary Total Hip Anterior Left

## 2012-04-17 NOTE — Anesthesia Procedure Notes (Signed)
Procedure Name: Intubation Date/Time: 04/17/2012 2:37 PM Performed by: Lovie Chol Pre-anesthesia Checklist: Patient identified, Emergency Drugs available, Suction available, Patient being monitored and Timeout performed Patient Re-evaluated:Patient Re-evaluated prior to inductionOxygen Delivery Method: Circle system utilized Preoxygenation: Pre-oxygenation with 100% oxygen Intubation Type: IV induction Ventilation: Mask ventilation without difficulty Laryngoscope Size: Miller and 2 Grade View: Grade I Tube type: Oral Tube size: 7.5 mm Number of attempts: 1 Airway Equipment and Method: Stylet and LTA kit utilized Placement Confirmation: ETT inserted through vocal cords under direct vision,  positive ETCO2,  CO2 detector and breath sounds checked- equal and bilateral Secured at: 21 cm Tube secured with: Tape Dental Injury: Teeth and Oropharynx as per pre-operative assessment

## 2012-04-17 NOTE — Anesthesia Postprocedure Evaluation (Signed)
  Anesthesia Post-op Note  Patient: James Stevens  Procedure(s) Performed: Procedure(s): LEFT TOTAL HIP ARTHROPLASTY ANTERIOR APPROACH (Left)  Patient Location: PACU  Anesthesia Type:General  Level of Consciousness: awake  Airway and Oxygen Therapy: Patient Spontanous Breathing  Post-op Pain: mild  Post-op Assessment: Post-op Vital signs reviewed  Post-op Vital Signs: Reviewed  Complications: No apparent anesthesia complications

## 2012-04-17 NOTE — Brief Op Note (Signed)
04/17/2012  4:57 PM  PATIENT:  James Stevens  52 y.o. male  PRE-OPERATIVE DIAGNOSIS:  Avascular necrosis left hip  POST-OPERATIVE DIAGNOSIS:  Avascular necrosis left hip  PROCEDURE:  Procedure(s): LEFT TOTAL HIP ARTHROPLASTY ANTERIOR APPROACH (Left)  SURGEON:  Surgeon(s) and Role:    * Kathryne Hitch, MD - Primary  PHYSICIAN ASSISTANT: Rexene Edison, PA-C  ANESTHESIA:   general  EBL:  Total I/O In: 2000 [I.V.:2000] Out: 300 [Blood:300]  BLOOD ADMINISTERED:none  DRAINS: none   LOCAL MEDICATIONS USED:  NONE  SPECIMEN:  No Specimen  DISPOSITION OF SPECIMEN:  N/A  COUNTS:  YES  TOURNIQUET:  * No tourniquets in log *  DICTATION: .Other Dictation: Dictation Number [id number]  PLAN OF CARE: Admit to inpatient   PATIENT DISPOSITION:  PACU - hemodynamically stable.   Delay start of Pharmacological VTE agent (>24hrs) due to surgical blood loss or risk of bleeding: no

## 2012-04-17 NOTE — Progress Notes (Signed)
Patient notified of surgery needing to be postponed until 1430 due to coffee he drank. Patient verbalized understanding and smiled. Fiance at bedside. Call bell in reach.

## 2012-04-17 NOTE — Progress Notes (Signed)
Patient informed Nurse that he had a half a cup of coffee with sugar and cream this morning at 0840. Will inform Anesthesia of this.

## 2012-04-17 NOTE — Anesthesia Preprocedure Evaluation (Addendum)
Anesthesia Evaluation  Patient identified by MRN, date of birth, ID band Patient awake    Reviewed: Allergy & Precautions, H&P , NPO status , Patient's Chart, lab work & pertinent test results  History of Anesthesia Complications Negative for: history of anesthetic complications  Airway Mallampati: II TM Distance: >3 FB Neck ROM: Full    Dental no notable dental hx. (+) Teeth Intact and Dental Advisory Given   Pulmonary Current Smoker,  breath sounds clear to auscultation  Pulmonary exam normal       Cardiovascular negative cardio ROS  Rhythm:Regular Rate:Normal     Neuro/Psych negative neurological ROS  negative psych ROS   GI/Hepatic Neg liver ROS, GERD-  Controlled,  Endo/Other  negative endocrine ROS  Renal/GU negative Renal ROS  negative genitourinary   Musculoskeletal   Abdominal   Peds  Hematology negative hematology ROS (+) Avascular necrosis.   Anesthesia Other Findings   Reproductive/Obstetrics negative OB ROS                          Anesthesia Physical Anesthesia Plan  ASA: II  Anesthesia Plan: General   Post-op Pain Management:    Induction: Intravenous  Airway Management Planned: Oral ETT  Additional Equipment:   Intra-op Plan:   Post-operative Plan: Extubation in OR  Informed Consent: I have reviewed the patients History and Physical, chart, labs and discussed the procedure including the risks, benefits and alternatives for the proposed anesthesia with the patient or authorized representative who has indicated his/her understanding and acceptance.   Dental advisory given  Plan Discussed with: CRNA  Anesthesia Plan Comments:         Anesthesia Quick Evaluation

## 2012-04-17 NOTE — H&P (Signed)
TOTAL HIP ADMISSION H&P  Patient is admitted for left total hip arthroplasty.  Subjective:  Chief Complaint: left hip pain  HPI: James Stevens, 52 y.o. male, has a history of pain and functional disability in the left hip(s) due to avascular necrosis and patient has failed non-surgical conservative treatments for greater than 12 weeks to include NSAID's and/or analgesics, use of assistive devices and activity modification.  Onset of symptoms was gradual starting 3 years ago with rapidlly worsening course since that time.The patient noted no past surgery on the left hip(s).  Patient currently rates pain in the left hip at 10 out of 10 with activity. Patient has worsening of pain with activity and weight bearing, pain that interfers with activities of daily living and pain with passive range of motion. Patient has evidence of subchondral cysts by imaging studies. This condition presents safety issues increasing the risk of falls.  There is no current active infection.  Patient Active Problem List   Diagnosis Date Noted  . Avascular necrosis of hip 04/17/2012   Past Medical History  Diagnosis Date  . Chronic back pain     and right hip  . GERD (gastroesophageal reflux disease)     occ tums  . Arthritis     Past Surgical History  Procedure Laterality Date  . Joint replacement      rt hip replacement  . Mandible fracture surgery    . Esophagogastroduodenoscopy  09/06/2011    Procedure: ESOPHAGOGASTRODUODENOSCOPY (EGD);  Surgeon: Vertell Novak., MD;  Location: [facility];  Service: Endoscopy;  Laterality: N/A;  . Savory dilation  09/06/2011    Procedure: SAVORY DILATION;  Surgeon: Vertell Novak., MD;  Location: [facility];  Service: Endoscopy;  Laterality: N/A;  . Esophageal dilation      No prescriptions prior to admission   Allergies  Allergen Reactions  . Ibuprofen     Gastric irritation  . Tramadol     itching    History  Substance Use Topics  . Smoking  status: Current Every Day Smoker -- 0.50 packs/day for 34 years    Types: Cigarettes  . Smokeless tobacco: Not on file  . Alcohol Use: 1.8 oz/week    3 Cans of beer per week     Comment: 3x a week     No family history on file.   Review of Systems  All other systems reviewed and are negative.    Objective:  Physical Exam  Constitutional: He is oriented to person, place, and time. He appears well-developed and well-nourished.  HENT:  Head: Normocephalic and atraumatic.  Eyes: EOM are normal. Pupils are equal, round, and reactive to light.  Neck: Normal range of motion. Neck supple.  Cardiovascular: Normal rate and regular rhythm.   Respiratory: Effort normal and breath sounds normal.  GI: Soft. Bowel sounds are normal.  Musculoskeletal:       Left hip: He exhibits decreased range of motion, decreased strength and bony tenderness.  Neurological: He is alert and oriented to person, place, and time.  Skin: Skin is warm and dry.  Psychiatric: He has a normal mood and affect.    Vital signs in last 24 hours:    Labs:   Estimated body mass index is 21.09 kg/(m^2) as calculated from the following:   Height as of 09/06/11: 5\' 10"  (1.778 m).   Weight as of 08/23/11: 66.679 kg (147 lb).   Imaging Review Plain radiographs demonstrate mild degenerative joint disease of the  left hip(s). The bone quality appears to be excellent for age and reported activity level.  Assessment/Plan:  End stage arthritis, left hip(s)  The patient history, physical examination, clinical judgement of the provider and imaging studies are consistent with end stage degenerative joint disease of the left hip(s) and total hip arthroplasty is deemed medically necessary. The treatment options including medical management, injection therapy, arthroscopy and arthroplasty were discussed at length. The risks and benefits of total hip arthroplasty were presented and reviewed. The risks due to aseptic loosening,  infection, stiffness, dislocation/subluxation,  thromboembolic complications and other imponderables were discussed.  The patient acknowledged the explanation, agreed to proceed with the plan and consent was signed. Patient is being admitted for inpatient treatment for surgery, pain control, PT, OT, prophylactic antibiotics, VTE prophylaxis, progressive ambulation and ADL's and discharge planning.The patient is planning to be discharged home with home health services

## 2012-04-17 NOTE — Transfer of Care (Signed)
Immediate Anesthesia Transfer of Care Note  Patient: James Stevens  Procedure(s) Performed: Procedure(s): LEFT TOTAL HIP ARTHROPLASTY ANTERIOR APPROACH (Left)  Patient Location: PACU  Anesthesia Type:General  Level of Consciousness: awake, alert , oriented and patient cooperative  Airway & Oxygen Therapy: Patient Spontanous Breathing and Patient connected to face mask oxygen  Post-op Assessment: Report given to PACU RN and Post -op Vital signs reviewed and stable  Post vital signs: Reviewed and stable  Complications: No apparent anesthesia complications

## 2012-04-17 NOTE — Progress Notes (Signed)
Spoke with Dr Sampson Goon and informed him that pt had is coffee with cream and sugar at 0840.  He stated pt will need to be postponed until 1430.  OR desk notified.

## 2012-04-17 NOTE — Preoperative (Signed)
Beta Blockers   Reason not to administer Beta Blockers:Not Applicable 

## 2012-04-18 ENCOUNTER — Encounter (HOSPITAL_COMMUNITY): Payer: Self-pay | Admitting: Orthopaedic Surgery

## 2012-04-18 LAB — CBC
MCV: 94.5 fL (ref 78.0–100.0)
Platelets: 224 10*3/uL (ref 150–400)
RBC: 3.66 MIL/uL — ABNORMAL LOW (ref 4.22–5.81)
RDW: 13.4 % (ref 11.5–15.5)
WBC: 8.2 10*3/uL (ref 4.0–10.5)

## 2012-04-18 LAB — BASIC METABOLIC PANEL
CO2: 27 mEq/L (ref 19–32)
Chloride: 101 mEq/L (ref 96–112)
GFR calc Af Amer: 82 mL/min — ABNORMAL LOW (ref >90)
Potassium: 4.4 mEq/L (ref 3.5–5.1)
Sodium: 136 mEq/L (ref 135–145)

## 2012-04-18 MED ORDER — TAMSULOSIN HCL 0.4 MG PO CAPS
0.4000 mg | ORAL_CAPSULE | Freq: Every day | ORAL | Status: DC
  Administered 2012-04-18 – 2012-04-20 (×3): 0.4 mg via ORAL
  Filled 2012-04-18 (×3): qty 1

## 2012-04-18 NOTE — Evaluation (Signed)
Occupational Therapy Evaluation and Discharge Patient Details Name: BRENN DEZIEL MRN: [id number] DOB: [birth year] Today's Date: 04/18/2012 Time: 9147-8295 OT Time Calculation (min): 29 min  OT Assessment / Plan / Recommendation Clinical Impression  This 52 yo male s/p left direct anterior approach hip replacement presents to acute OT with all education completed.  No further OT need, we will sign off.    OT Assessment  Patient does not need any further OT services    Follow Up Recommendations  No OT follow up       Equipment Recommendations  3 in 1 bedside comode          Precautions / Restrictions Precautions Precautions: Fall Precaution Comments: Direct Anterior- No precautions.   Restrictions Weight Bearing Restrictions: Yes LLE Weight Bearing: Weight bearing as tolerated   Pertinent Vitals/Pain 7/10 left hip with mobility; repositioned, applied cold, RN in to give him a muscle relaxer    ADL  Eating/Feeding: Simulated;Independent Where Assessed - Eating/Feeding: Chair Grooming: Wash/dry hands;Performed;Supervision/safety Where Assessed - Grooming: Unsupported standing Upper Body Bathing: Simulated;Set up Where Assessed - Upper Body Bathing: Unsupported sitting Lower Body Bathing: Simulated;Minimal assistance Where Assessed - Lower Body Bathing: Unsupported sit to stand Upper Body Dressing: Simulated;Set up Where Assessed - Upper Body Dressing: Unsupported sitting Lower Body Dressing: Simulated;Minimal assistance Where Assessed - Lower Body Dressing: Unsupported sit to stand Toilet Transfer: Performed;Supervision/safety Toilet Transfer Method: Sit to Barista: Raised toilet seat with arms (or 3-in-1 over toilet) Toileting - Clothing Manipulation and Hygiene: Performed;Supervision/safety Where Assessed - Glass blower/designer Manipulation and Hygiene: Standing Equipment Used: Gait belt;Rolling walker Transfers/Ambulation Related to ADLs: S for  all ADL Comments: Pt cannot get to his left foot currently, has A at home prn      Visit Information  Last OT Received On: 04/18/12 Assistance Needed: +1    Subjective Data  Subjective:   I have help if I need it   Prior Functioning     Home Living Lives With: Significant other Available Help at Discharge: Family;Available 24 hours/day Type of Home: House Home Access: Stairs to enter [employer] of Steps: 5 Entrance Stairs-Rails: Left Home Layout: One level Bathroom Shower/Tub: Forensic scientist: Standard Bathroom Accessibility: Yes How Accessible: Accessible via walker Home Adaptive Equipment: None Additional Comments: pt did not save all his DME from [facility] in 2010.  Discussed with pt that he may have to pay out of pocket for DME unless he can borrow from someone.   Prior Function Level of Independence: Independent Able to Take Stairs?: Yes Driving: Yes Vocation: On disability Communication Communication: No difficulties Dominant Hand: Right            Cognition  Cognition Overall Cognitive Status: Appears within functional limits for tasks assessed/performed Arousal/Alertness: Awake/alert Orientation Level: Appears intact for tasks assessed Behavior During Session: [facility] for tasks performed    Extremity/Trunk Assessment Right Upper Extremity Assessment RUE ROM/Strength/Tone: Within functional levels Left Upper Extremity Assessment LUE ROM/Strength/Tone: Within functional levels Right Lower Extremity Assessment RLE ROM/Strength/Tone: WFL for tasks assessed RLE Sensation: WFL - Light Touch Left Lower Extremity Assessment LLE ROM/Strength/Tone: Deficits LLE ROM/Strength/Tone Deficits: Generally weak post-op, but able to move LE against gravity.   LLE Sensation: WFL - Light Touch Trunk Assessment Trunk Assessment: Normal     Mobility Bed Mobility Bed Mobility: Not assessed Details for Bed Mobility Assistance: Pt up in  recliner upon arrival Transfers Transfers: Sit to Stand;Stand to Sit Sit to Stand: 5: Supervision;With  armrests;From chair/3-in-1;With upper extremity assist Stand to Sit: 5: Supervision;With upper extremity assist;To chair/3-in-1;With armrests Details for Transfer Assistance: Pt did not need cueing for hand placement     Exercise Total Joint Exercises Ankle Circles/Pumps: AROM;Both;10 reps Quad Sets: AROM;Both;10 reps Long Arc Quad: AROM;Left;5 reps   Balance Balance Balance Assessed: No   End of Session OT - End of Session Equipment Utilized During Treatment: Gait belt Activity Tolerance: Patient tolerated treatment well Patient left: in chair       Evette Georges [phone] 04/18/2012, 11:17 AM

## 2012-04-18 NOTE — Progress Notes (Signed)
UR COMPLETED  

## 2012-04-18 NOTE — Care Management Note (Signed)
04/18/2012  Patient:  LEEUM, SANKEY   Account Number:  [id number]  Date Initiated:  04/18/2012  Documentation initiated by:  Vance Peper  Subjective/Objective Assessment:   52 yr old male s/p left anterior hip arthroplasty.     Action/Plan:   CM spoke with patient and wife concerning home health and DME needs at discharge. Choice offered.   Anticipated DC Date:  04/19/2012   Anticipated DC Plan:  HOME W HOME HEALTH SERVICES      DC Planning Services  CM consult      [facility] Choice  HOME HEALTH  DURABLE MEDICAL EQUIPMENT   Choice offered to / List presented to:  C-1 Patient   DME arranged  3-N-1  Levan Hurst      DME agency  [facility]     HH arranged  HH-2 PT      [facility] agency  [facility]   Status of service:  Completed, signed off Medicare Important Message given?   (If response is "NO", the following Medicare IM given date fields will be blank) Date Medicare IM given:   Date Additional Medicare IM given:    Discharge Disposition:  HOME W HOME HEALTH SERVICES  Per UR Regulation:    If discussed at Long Length of Stay Meetings, dates discussed:    Comments:

## 2012-04-18 NOTE — Progress Notes (Signed)
[facility]  Patient Status: New  [facility] is providing the following services: PT  If patient discharges after hours, please call [phone].   Wynelle Bourgeois 04/18/2012, 2:07 PM

## 2012-04-18 NOTE — Evaluation (Signed)
Physical Therapy Evaluation Patient Details Name: James Stevens MRN: [id number] DOB: [birth year] Today's Date: 04/18/2012 Time: 4696-2952 PT Time Calculation (min): 24 min  PT Assessment / Plan / Recommendation Clinical Impression  pt presents with [facility].  Anticipate good progress as pt seems motivated to improve mobility.  Some cueing for safety and need to call for A.  pt used to have DME from [facility] in 2010, but lost it.  Discussed with CM pt's need for DME.      PT Assessment  Patient needs continued PT services    Follow Up Recommendations  Home health PT;Supervision - Intermittent    Does the patient have the potential to tolerate intense rehabilitation      Barriers to Discharge None      Equipment Recommendations  Rolling walker with 5" wheels (3-in-1)    Recommendations for Other Services OT consult   Frequency 7X/week    Precautions / Restrictions Precautions Precautions: Fall Precaution Comments: Direct Anterior- No precautions.   Restrictions Weight Bearing Restrictions: Yes LLE Weight Bearing: Weight bearing as tolerated   Pertinent Vitals/Pain Indicates pain, but did not rate.  Premedicated.        Mobility  Bed Mobility Bed Mobility: Not assessed Transfers Transfers: Stand to Sit Stand to Sit: 4: Min guard;With upper extremity assist;To chair/3-in-1;With armrests Details for Transfer Assistance: pt up standing in room upon PT entering.  Discussed calling for A.  pt needed cues for UE use and safety.   Ambulation/Gait Ambulation/Gait Assistance: 4: Min guard Ambulation Distance (Feet): 90 Feet Assistive device: Rolling walker Ambulation/Gait Assistance Details: cues for upright posture, gait sequencing, use of RW.   Gait Pattern: Step-to pattern;Decreased step length - right;Decreased stance time - left;Trunk flexed Stairs: No Wheelchair Mobility Wheelchair Mobility: No    Exercises Total Joint Exercises Ankle Circles/Pumps: AROM;Both;10  reps Quad Sets: AROM;Both;10 reps Long Arc Quad: AROM;Left;5 reps   PT Diagnosis: Abnormality of gait;Acute pain  PT Problem List: Decreased strength;Decreased activity tolerance;Decreased balance;Decreased mobility;Decreased knowledge of use of DME;Pain PT Treatment Interventions: DME instruction;Gait training;Stair training;Functional mobility training;Therapeutic activities;Therapeutic exercise;Balance training;Patient/family education   PT Goals Acute Rehab PT Goals PT Goal Formulation: With patient Time For Goal Achievement: 04/25/12 Potential to Achieve Goals: Good Pt will go Supine/Side to Sit: with modified independence PT Goal: Supine/Side to Sit - Progress: Goal set today Pt will go Sit to Supine/Side: with modified independence PT Goal: Sit to Supine/Side - Progress: Goal set today Pt will go Sit to Stand: with modified independence PT Goal: Sit to Stand - Progress: Goal set today Pt will go Stand to Sit: with modified independence PT Goal: Stand to Sit - Progress: Goal set today Pt will Ambulate: >150 feet;with modified independence;with rolling walker PT Goal: Ambulate - Progress: Goal set today Pt will Go Up / Down Stairs: 3-5 stairs;with supervision;with least restrictive assistive device PT Goal: Up/Down Stairs - Progress: Goal set today Pt will Perform Home Exercise Program: Independently PT Goal: Perform Home Exercise Program - Progress: Goal set today  Visit Information  Last PT Received On: 04/18/12 Assistance Needed: +1    Subjective Data  Subjective: I felt like getting up.-  pt up in room upon PT entering room.   Patient Stated Goal: Home   Prior Functioning  Home Living Lives With: Significant other Available Help at Discharge: Family;Available 24 hours/day Type of Home: House Home Access: Stairs to enter [employer] of Steps: 5 Entrance Stairs-Rails: Left Home Layout: One level Home Adaptive  Equipment: None Additional Comments: pt did  not save all his DME from [facility] in 2010.  Discussed with pt that he may have to pay out of pocket for DME unless he can borrow from someone.   Prior Function Level of Independence: Independent Able to Take Stairs?: Yes Driving: Yes Communication Communication: No difficulties    Cognition  Cognition Overall Cognitive Status: Appears within functional limits for tasks assessed/performed Arousal/Alertness: Awake/alert Orientation Level: Appears intact for tasks assessed Behavior During Session: [facility] for tasks performed    Extremity/Trunk Assessment Right Lower Extremity Assessment RLE ROM/Strength/Tone: [facility] for tasks assessed RLE Sensation: WFL - Light Touch Left Lower Extremity Assessment LLE ROM/Strength/Tone: Deficits LLE ROM/Strength/Tone Deficits: Generally weak post-op, but able to move LE against gravity.   LLE Sensation: WFL - Light Touch Trunk Assessment Trunk Assessment: Normal   Balance Balance Balance Assessed: No  End of Session PT - End of Session Equipment Utilized During Treatment: Gait belt Activity Tolerance: Patient tolerated treatment well Patient left: in chair;with call bell/phone within reach Nurse Communication: Mobility status  GP     Sunny Schlein, [place] [phone] 04/18/2012, 10:19 AM

## 2012-04-18 NOTE — Progress Notes (Signed)
Pt was unable to void after many attempts. I&O cath performed. Output of 900 cc obtained.

## 2012-04-18 NOTE — Progress Notes (Signed)
Pt continues to have difficulty urinating. Voids in very small amounts.

## 2012-04-18 NOTE — Progress Notes (Signed)
Subjective: 1 Day Post-Op Procedure(s) (LRB): LEFT TOTAL HIP ARTHROPLASTY ANTERIOR APPROACH (Left) Patient reports pain as mild.  Difficulty voiding.  Objective: Vital signs in last 24 hours: Temp:  [97.3 F (36.3 C)-98.4 F (36.9 C)] 98.4 F (36.9 C) (04/09 0540) Pulse Rate:  [61-110] 110 (04/09 0540) Resp:  [9-20] 20 (04/09 0540) BP: (120-191)/(68-97) 133/86 mmHg (04/09 0540) SpO2:  [100 %] 100 % (04/09 0540) Weight:  [66.6 kg (146 lb 13.2 oz)] 66.6 kg (146 lb 13.2 oz) (04/08 1900)  Intake/Output from previous day: 04/08 0701 - 04/09 0700 In: 3553.8 [P.O.:560; I.V.:2993.8] Out: 1200 [Urine:900; Blood:300] Intake/Output this shift:     Recent Labs  04/18/12 0445  HGB 12.1*    Recent Labs  04/18/12 0445  WBC 8.2  RBC 3.66*  HCT 34.6*  PLT 224    Recent Labs  04/18/12 0445  NA 136  K 4.4  CL 101  CO2 27  BUN 9  CREATININE 1.17  GLUCOSE 121*  CALCIUM 8.9   No results found for this basename: LABPT, INR,  in the last 72 hours  Sensation intact distally Intact pulses distally Dorsiflexion/Plantar flexion intact Incision: dressing C/D/I  Assessment/Plan: 1 Day Post-Op Procedure(s) (LRB): LEFT TOTAL HIP ARTHROPLASTY ANTERIOR APPROACH (Left) Up with therapy  James Stevens Y 04/18/2012, 7:06 AM

## 2012-04-18 NOTE — Op Note (Signed)
James Stevens, James Stevens             ACCOUNT NO.:  [id number]  MEDICAL RECORD NO.:  [id number]  LOCATION:  [id number]                        FACILITY:  [facility]  PHYSICIAN:  James Stevens, M.D.DATE OF BIRTH:  08/09/1960  DATE OF PROCEDURE:  04/17/2012 DATE OF DISCHARGE:                              OPERATIVE REPORT   PREOPERATIVE DIAGNOSIS:  Left hip avascular necrosis.  POSTOPERATIVE DIAGNOSIS:  Left hip avascular necrosis.  PROCEDURE:  Left total hip arthroplasty through direct anterior approach.  IMPLANTS:  DePuy Sector Gription acetabular component size 52, apex hole eliminator guide, 1 single screw, 36+ 4 neutral polyethylene liner, size 12 Corail femoral component with standard offset, size 36+ 1.5 ceramic hip ball.  SURGEON:  James Stevens, M.D.  ASSISTANT:  James Stevens, P.A.  ANESTHESIA:  General.  ANTIBIOTICS:  2 g of IV Ancef.  BLOOD LOSS:  350 mL.  COMPLICATIONS:  None.  INDICATIONS:  James Stevens is a 52 year old gentleman with avascular necrosis involving both his hips.  He has had a right total hip arthroplasty in the past and now presents for left total hip arthroplasty due to the worsening of avascular necrosis as well as severe pain.  He has failed conservative treatment.  He does wish to proceed with surgery at this point.  PROCEDURE DESCRIPTION:  After informed consent was obtained, appropriate left hip was marked.  He was brought to the operating room, and general anesthesia was obtained while he was on the stretcher.  Traction boots were then placed on both his feet.  He was placed next supine on the HANA fracture table with the perineal post in place and both legs in inline skeletal traction, but no traction applied.  His left hip was then prepped and draped with DuraPrep and sterile drapes.  We assessed the hip under direct fluoroscopy.  It was draped out sterilely as well. Time-out was called and he was identified as correct  patient, correct left hip.  We then made an incision inferior and posterior to the anterior-superior iliac spine and carried this obliquely down the leg. I dissected down to the tensor fascia lata and the tensor fascia was divided longitudinally.  I then proceeded with a direct anterior approach to the hip.  Cobra retractor was placed around the lateral neck and up underneath to the rectus femoris, a medial retractor was placed. I cauterized the lateral femoral circumflex vessels and then opened up the hip capsule in a L taped format and found a large effusion.  I placed the Cobra retractors within the hip capsule.  I then made my femoral neck cut just proximal to the lesser trochanter with an oscillating saw and completed this with an osteotome.  I placed a corkscrew guide in the femoral head and removed the femoral head in its entirety and found collapse of the cartilage in the femoral head being very soft and fibrinous.  I then cleaned the acetabulum debris including remnants of the labrum.  I placed a bent Hohmann medially and a Cobra retractor laterally and then began reaming from size 42 reamer in 2 mm increments all the way up to size 52 with all reamers placed under direct visualization and the last reamer  also placed under direct fluoroscopy.  We could obtain our depth of reaming, our inclination, and anteversion.  I then placed the real DePuy Sector Gription acetabular component under direct visualization and fluoroscopy.  I placed the apex hole eliminator guide and then a single screw.  I then placed the real 36+ 4 neutral polyethylene liner.  Attention was then turned to the femur.  With the leg externally rotated to 90 degrees, extended and adducted, I placed a Mueller retractor medially and a bent Hohmann retractor behind the greater trochanter.  I released the lateral hip capsule and some of the piriformis, I was able to bring the leg up further.  I then used a box cutting  guide and a rongeur to lateralize the femoral Stevens.  I then began broaching from a size 8 broached up to a size 12.  This was similar to his other side.  I then trialed a standard neck and a 36+ 1.5 hip ball.  We brought the leg back over and up with traction and internal rotation reduced into the pelvis.  He was stable throughout his internal and external rotation with minimal shuck and his leg lengths were measured near equal under direct fluoroscopy. We then dislocated the hip and removed the trial components.  I then placed the real size 12 femoral component with standard offset and then real 36+ 1.5 ceramic hip ball.  We reduced this back in the acetabulum and again it was stable and had excellent rotation with stability as well.  We then copiously irrigated the soft tissues with normal saline solution and pulsatile lavage, closed the joint capsule with interrupted #1 Ethibond suture followed by #1 Vicryl running in the tensor fascia, 0- Vicryl in the deep tissue, 2-0 Vicryl in subcutaneous tissue, 4-0 Monocryl subcuticular stitch, Dermabond and Aquacel dressing.  He was then taken off the HANA table.  His leg lengths were felt to be equal. He was awakened, extubated, and taken to the recovery room in stable condition.  All final counts were correct and there were no complications noted.  Of note, James Basque, PA-C was present during the entire case.  His presence was crucial for patient positioning, retraction, and exposure of the hip joint and placement of the implants as well as closure of the wound.     James Stevens, M.D.     CYB/MEDQ  D:  04/17/2012  T:  04/18/2012  Job:  [id number]

## 2012-04-19 LAB — CBC
HCT: 30.6 % — ABNORMAL LOW (ref 39.0–52.0)
Hemoglobin: 10.8 g/dL — ABNORMAL LOW (ref 13.0–17.0)
MCH: 33.1 pg (ref 26.0–34.0)
MCHC: 35.3 g/dL (ref 30.0–36.0)

## 2012-04-19 MED ORDER — ASPIRIN EC 325 MG PO TBEC: 325.0000 mg | DELAYED_RELEASE_TABLET | Freq: Two times a day (BID) | ORAL | Status: DC

## 2012-04-19 MED ORDER — FERROUS SULFATE 325 (65 FE) MG PO TABS: 325.0000 mg | ORAL_TABLET | Freq: Three times a day (TID) | ORAL | Status: DC

## 2012-04-19 MED ORDER — OXYCODONE-ACETAMINOPHEN 5-325 MG PO TABS: 1.0000 | ORAL_TABLET | ORAL | Status: DC | PRN

## 2012-04-19 MED ORDER — METHOCARBAMOL 500 MG PO TABS: 500.0000 mg | ORAL_TABLET | Freq: Four times a day (QID) | ORAL | Status: DC | PRN

## 2012-04-19 NOTE — Progress Notes (Signed)
Seen and agreed 04/19/2012 [place] Filice Elizabeth PTA [phone] pager [phone] office    

## 2012-04-19 NOTE — Progress Notes (Signed)
I have seen and examined the patient and agree with the above note.

## 2012-04-19 NOTE — Progress Notes (Signed)
Seen and agreed 04/19/2012 Duel Conrad Elizabeth PTA [phone] pager [phone] office    

## 2012-04-19 NOTE — Progress Notes (Signed)
Subjective: 2 Days Post-Op Procedure(s) (LRB): LEFT TOTAL HIP ARTHROPLASTY ANTERIOR APPROACH (Left) Patient reports pain as moderate.  Patient wanting a scooter. Spoke with him about need to work PT and to ambulate. No dizziness or light headedness . Tolerating diet well.   Objective: Vital signs in last 24 hours: Temp:  [98.3 F (36.8 C)-99.1 F (37.3 C)] 99.1 F (37.3 C) (04/10 0525) Pulse Rate:  [68-96] 68 (04/10 0525) Resp:  [16-20] 16 (04/10 0525) BP: (101-119)/(58-78) 101/58 mmHg (04/10 0525) SpO2:  [97 %-99 %] 98 % (04/10 0525)  Intake/Output from previous day: 04/09 0701 - 04/10 0700 In: 1900 [P.O.:980; I.V.:920] Out: 1950 [Urine:1950] Intake/Output this shift:     Recent Labs  04/18/12 0445 04/19/12 0540  HGB 12.1* 10.8*    Recent Labs  04/18/12 0445 04/19/12 0540  WBC 8.2 8.6  RBC 3.66* 3.26*  HCT 34.6* 30.6*  PLT 224 192    Recent Labs  04/18/12 0445  NA 136  K 4.4  CL 101  CO2 27  BUN 9  CREATININE 1.17  GLUCOSE 121*  CALCIUM 8.9   No results found for this basename: LABPT, INR,  in the last 72 hours  Neurovascular intact Sensation intact distally Intact pulses distally Dorsiflexion/Plantar flexion intact Incision: dressing C/D/I Compartment soft  Assessment/Plan: 2 Days Post-Op Procedure(s) (LRB): LEFT TOTAL HIP ARTHROPLASTY ANTERIOR APPROACH (Left) Up with therapy Possible d/c home with home health PT tomorrow ABLA secondary to surgery no symptoms  Ilisha Blust 04/19/2012, 10:27 AM

## 2012-04-19 NOTE — Progress Notes (Signed)
Physical Therapy Treatment Patient Details Name: James Stevens MRN: [id number] DOB: Dec 24, 1960 Today's Date: 04/19/2012 Time: 6213-0865 PT Time Calculation (min): 25 min  PT Assessment / Plan / Recommendation Comments on Treatment Session  Patient had difficulty walking as LLE longer than right.  Patient ambulated with more ease than this morning.  Patient participated in Ther Ex.  Patient wanted to get up without supervision.  Patient educated to call nurse should he need to get up.  Call button in reach. Stairs for tomorrow.      Follow Up Recommendations  Home health PT;Supervision - Intermittent     Does the patient have the potential to tolerate intense rehabilitation     Barriers to Discharge        Equipment Recommendations  Rolling walker with 5" wheels    Recommendations for Other Services    Frequency 7X/week   Plan Discharge plan remains appropriate;Frequency remains appropriate    Precautions / Restrictions Precautions Precautions: Fall Precaution Booklet Issued: No Precaution Comments: Direct Anterior- No precautions.   Restrictions Weight Bearing Restrictions: Yes LLE Weight Bearing: Weight bearing as tolerated   Pertinent Vitals/Pain 10/10 Pain L Hip.    Mobility  Transfers Transfers: Sit to Stand;Stand to Sit Sit to Stand: 5: Supervision;With armrests;With upper extremity assist;From chair/3-in-1 Stand to Sit: 5: Supervision;With upper extremity assist;With armrests;To chair/3-in-1 Details for Transfer Assistance: Pt needs supervision for safety.  Patient with correct hand placement. Ambulation/Gait Ambulation/Gait Assistance: 4: Min guard Ambulation Distance (Feet): 90 Feet Assistive device: Rolling walker Ambulation/Gait Assistance Details: Patient vaulting.  Needed to stop to rest multiple times.  Patient stated LLE 5 inches longer than right. Gait Pattern: Step-to pattern;Decreased step length - right;Decreased stance time - left;Trunk flexed Gait  velocity: Decreased. Stairs: No    Exercises Total Joint Exercises Hip ABduction/ADduction: AAROM;Left;10 reps Long Arc Quad: AROM;Left;10 reps   PT Diagnosis:    PT Problem List:   PT Treatment Interventions:     PT Goals Acute Rehab PT Goals PT Goal: Sit to Stand - Progress: Progressing toward goal PT Goal: Stand to Sit - Progress: Progressing toward goal PT Goal: Ambulate - Progress: Progressing toward goal PT Goal: Perform Home Exercise Program - Progress: Progressing toward goal  Visit Information  Last PT Received On: 04/19/12 Assistance Needed: +1    Subjective Data      Cognition  Cognition Overall Cognitive Status: Appears within functional limits for tasks assessed/performed Arousal/Alertness: Awake/alert Orientation Level: Appears intact for tasks assessed Behavior During Session: [facility] for tasks performed    Balance     End of Session PT - End of Session Equipment Utilized During Treatment: Gait belt Activity Tolerance: Patient tolerated treatment well Patient left: in chair;with call bell/phone within reach   GP     [facility], Michale Emmerich JEAN SPTA 04/19/2012, 3:09 PM

## 2012-04-19 NOTE — Progress Notes (Signed)
Physical Therapy Treatment Patient Details Name: James Stevens MRN: [id number] DOB: February 18, 1960 Today's Date: 04/19/2012 Time: 4098-1191 PT Time Calculation (min): 31 min  PT Assessment / Plan / Recommendation Comments on Treatment Session  Patient had difficulty walking as LLE longer than right.  Patient in significant pain.  Stairs for this afternoon or tomorrow.  Ther Ex for later this afternoon.    Follow Up Recommendations  Home health PT;Supervision - Intermittent     Does the patient have the potential to tolerate intense rehabilitation     Barriers to Discharge        Equipment Recommendations  Rolling walker with 5" wheels    Recommendations for Other Services    Frequency 7X/week   Plan Discharge plan remains appropriate;Frequency remains appropriate    Precautions / Restrictions Precautions Precautions: Fall;Knee Precaution Booklet Issued: No Restrictions Weight Bearing Restrictions: Yes LLE Weight Bearing: Weight bearing as tolerated   Pertinent Vitals/Pain 10/10 Left hip.    Mobility  Bed Mobility Bed Mobility: Supine to Sit Supine to Sit: 4: Min assist Details for Bed Mobility Assistance: PT required assistance for LLE.   Transfers Transfers: Sit to Stand;Stand to Sit Sit to Stand: 5: Supervision;With armrests;With upper extremity assist;From bed Stand to Sit: 5: Supervision;With upper extremity assist;With armrests;To chair/3-in-1 Details for Transfer Assistance: Pt needed cuing for hand placement from bed.  Not chair. Ambulation/Gait Ambulation/Gait Assistance: 4: Min guard Ambulation Distance (Feet): 90 Feet Assistive device: Rolling walker Ambulation/Gait Assistance Details: Patient vaulting.  Patient stated LLE 5 inches longer than right.   Gait Pattern: Step-to pattern;Decreased step length - right;Decreased stance time - left;Trunk flexed Gait velocity: Decreased. Stairs: No    Exercises     PT Diagnosis:    PT Problem List:   PT  Treatment Interventions:     PT Goals Acute Rehab PT Goals PT Goal: Supine/Side to Sit - Progress: Progressing toward goal PT Goal: Sit to Stand - Progress: Progressing toward goal PT Goal: Stand to Sit - Progress: Progressing toward goal PT Goal: Ambulate - Progress: Progressing toward goal  Visit Information  Last PT Received On: 04/19/12 Assistance Needed: +1    Subjective Data      Cognition  Cognition Overall Cognitive Status: Appears within functional limits for tasks assessed/performed Arousal/Alertness: Awake/alert Orientation Level: Appears intact for tasks assessed Behavior During Session: [facility] for tasks performed    Balance     End of Session     GP     James Stevens JEAN SPTA 04/19/2012, 11:03 AM

## 2012-04-20 LAB — CBC
MCH: 33.1 pg (ref 26.0–34.0)
MCV: 93.9 fL (ref 78.0–100.0)
Platelets: 209 10*3/uL (ref 150–400)
RDW: 13 % (ref 11.5–15.5)

## 2012-04-20 NOTE — Progress Notes (Signed)
Subjective: 3 Days Post-Op Procedure(s) (LRB): LEFT TOTAL HIP ARTHROPLASTY ANTERIOR APPROACH (Left) Patient reports pain as mild.    Objective: Vital signs in last 24 hours: Temp:  [98.2 F (36.8 C)-99.3 F (37.4 C)] 98.2 F (36.8 C) (04/11 0600) Pulse Rate:  [87-105] 87 (04/11 0600) Resp:  [16-18] 18 (04/11 0600) BP: (91-98)/(47-59) 98/59 mmHg (04/11 0600) SpO2:  [94 %-97 %] 95 % (04/11 0600)  Intake/Output from previous day: 04/10 0701 - 04/11 0700 In: 360 [P.O.:360] Out: 400 [Urine:400] Intake/Output this shift: Total I/O In: 360 [P.O.:360] Out: 400 [Urine:400]   Recent Labs  04/18/12 0445 04/19/12 0540  HGB 12.1* 10.8*    Recent Labs  04/18/12 0445 04/19/12 0540  WBC 8.2 8.6  RBC 3.66* 3.26*  HCT 34.6* 30.6*  PLT 224 192    Recent Labs  04/18/12 0445  NA 136  K 4.4  CL 101  CO2 27  BUN 9  CREATININE 1.17  GLUCOSE 121*  CALCIUM 8.9   No results found for this basename: LABPT, INR,  in the last 72 hours  Sensation intact distally Intact pulses distally Dorsiflexion/Plantar flexion intact Incision: dressing C/D/I  Assessment/Plan: 3 Days Post-Op Procedure(s) (LRB): LEFT TOTAL HIP ARTHROPLASTY ANTERIOR APPROACH (Left) Discharge home with home health  Kathryne Hitch 04/20/2012, 6:58 AM

## 2012-04-20 NOTE — Progress Notes (Signed)
Seen and agreed with above note 04/20/2012 Casin Federici Elizabeth PTA [phone] pager [phone] office    

## 2012-04-20 NOTE — Progress Notes (Signed)
Physical Therapy Treatment Patient Details Name: James Stevens MRN: [id number] DOB: 1960/02/26 Today's Date: 04/20/2012 Time: 4098-1191 PT Time Calculation (min): 44 min  PT Assessment / Plan / Recommendation Comments on Treatment Session  Patient had difficulty walking as LLE longer than right.  Patient increased Ther Ex.  Patient unresponsive to stair cuing.  Increase ambulation this afternoon.     Follow Up Recommendations  Home health PT;Supervision - Intermittent     Does the patient have the potential to tolerate intense rehabilitation     Barriers to Discharge        Equipment Recommendations  Rolling walker with 5" wheels    Recommendations for Other Services    Frequency 7X/week   Plan Discharge plan remains appropriate;Frequency remains appropriate    Precautions / Restrictions Precautions Precautions: Fall Precaution Booklet Issued: No Precaution Comments: Direct Anterior- No precautions.   Restrictions Weight Bearing Restrictions: Yes LLE Weight Bearing: Weight bearing as tolerated   Pertinent Vitals/Pain 10/10 Pain.      Mobility  Bed Mobility Bed Mobility: Supine to Sit Supine to Sit: 4: Min assist Details for Bed Mobility Assistance: PT required assistance for LLE.   Transfers Transfers: Stand to Sit;Sit to Stand Sit to Stand: 5: Supervision;With armrests;With upper extremity assist;From bed;From chair/3-in-1 Stand to Sit: 5: Supervision;With upper extremity assist;With armrests;To chair/3-in-1 Details for Transfer Assistance: Pt needs supervision for safety.  Patient with correct hand placement. Ambulation/Gait Ambulation/Gait Assistance: 4: Min guard Ambulation Distance (Feet): 40 Feet Assistive device: Rolling walker Ambulation/Gait Assistance Details: Patient self limiting with ambulation.  Patient vaulting. Gait Pattern: Step-to pattern;Decreased step length - right;Decreased stance time - left;Trunk flexed Gait velocity: Decreased. Stairs:  Yes Stairs Assistance: 4: Min assist Stairs Assistance Details (indicate cue type and reason): Cueing for technique.  Patient's trunk flexed over rail cue to correct.  Patient continued with trunk flexed. Stair Management Technique: One rail Left;Sideways Number of Stairs: 6    Exercises Total Joint Exercises Gluteal Sets: AROM;Both;15 reps Hip ABduction/ADduction: AAROM;Left;15 reps Long Arc Quad: AROM;Left;10 reps   PT Diagnosis:    PT Problem List:   PT Treatment Interventions:     PT Goals Acute Rehab PT Goals PT Goal: Supine/Side to Sit - Progress: Progressing toward goal PT Goal: Sit to Stand - Progress: Progressing toward goal PT Goal: Stand to Sit - Progress: Progressing toward goal PT Goal: Ambulate - Progress: Progressing toward goal PT Goal: Up/Down Stairs - Progress: Progressing toward goal PT Goal: Perform Home Exercise Program - Progress: Progressing toward goal  Visit Information  Last PT Received On: 04/20/12 Assistance Needed: +1    Subjective Data      Cognition  Cognition Overall Cognitive Status: Appears within functional limits for tasks assessed/performed Arousal/Alertness: Awake/alert Orientation Level: Appears intact for tasks assessed Behavior During Session: [facility] for tasks performed    Balance     End of Session PT - End of Session Equipment Utilized During Treatment: Gait belt Activity Tolerance: Patient tolerated treatment well Patient left: in chair;with call bell/phone within reach   GP     [facility], Mykeisha Dysert JEAN  SPTA 04/20/2012, 10:14 AM

## 2012-04-20 NOTE — Discharge Summary (Signed)
Patient ID: James Stevens MRN: [id number] DOB/AGE: April 25, 1960 52 y.o.  Admit date: 04/17/2012 Discharge date: 04/20/2012  Admission Diagnoses:  Principal Problem:   Avascular necrosis of hip   Discharge Diagnoses:  Same  Past Medical History  Diagnosis Date  . Chronic back pain     and right hip  . GERD (gastroesophageal reflux disease)     occ tums  . Arthritis     Surgeries: Procedure(s): LEFT TOTAL HIP ARTHROPLASTY ANTERIOR APPROACH on 04/17/2012   Consultants:    Discharged Condition: Improved  Hospital Course: James Stevens is an 52 y.o. male who was admitted 04/17/2012 for operative treatment ofAvascular necrosis of hip. Patient has severe unremitting pain that affects sleep, daily activities, and work/hobbies. After pre-op clearance the patient was taken to the operating room on 04/17/2012 and underwent  Procedure(s): LEFT TOTAL HIP ARTHROPLASTY ANTERIOR APPROACH.    Patient was given perioperative antibiotics: Anti-infectives   Start     Dose/Rate Route Frequency Ordered Stop   04/17/12 2030  ceFAZolin (ANCEF) IVPB 1 g/50 mL premix     1 g 100 mL/hr over 30 Minutes Intravenous Every 6 hours 04/17/12 1839 04/18/12 0300   04/17/12 0600  ceFAZolin (ANCEF) IVPB 2 g/50 mL premix     2 g 100 mL/hr over 30 Minutes Intravenous On call to O.R. 04/16/12 1504 04/17/12 1430       Patient was given sequential compression devices, early ambulation, and chemoprophylaxis to prevent DVT.  Patient benefited maximally from hospital stay and there were no complications.    Recent vital signs: Patient Vitals for the past 24 hrs:  BP Temp Pulse Resp SpO2  04/20/12 0600 98/59 mmHg 98.2 F (36.8 C) 87 18 95 %  04/20/12 0400 - - - 16 94 %  04/20/12 0000 - - - 18 97 %  04/19/12 2109 91/50 mmHg 98.7 F (37.1 C) 102 18 94 %  04/19/12 2000 - - - 18 96 %  04/19/12 1300 98/47 mmHg 99.3 F (37.4 C) 105 16 95 %     Recent laboratory studies:  Recent Labs  04/18/12 0445  04/19/12 0540  WBC 8.2 8.6  HGB 12.1* 10.8*  HCT 34.6* 30.6*  PLT 224 192  NA 136  --   K 4.4  --   CL 101  --   CO2 27  --   BUN 9  --   CREATININE 1.17  --   GLUCOSE 121*  --   CALCIUM 8.9  --      Discharge Medications:     Medication List    STOP taking these medications       HYDROcodone-acetaminophen 5-325 MG per tablet  Commonly known as:  NORCO/VICODIN      TAKE these medications       aspirin EC 325 MG tablet  Take 1 tablet (325 mg total) by mouth 2 (two) times daily.     ferrous sulfate 325 (65 FE) MG tablet  Take 1 tablet (325 mg total) by mouth 3 (three) times daily after meals.     methocarbamol 500 MG tablet  Commonly known as:  ROBAXIN  Take 1 tablet (500 mg total) by mouth every 6 (six) hours as needed.     oxyCODONE-acetaminophen 5-325 MG per tablet  Commonly known as:  ROXICET  Take 1-2 tablets by mouth every 4 (four) hours as needed for pain.        Diagnostic Studies: Dg Hip Operative Left  04/17/2012  *RADIOLOGY REPORT*  Clinical Data: Hip replacement  OPERATIVE LEFT HIP  Comparison: None.  Findings: The left total hip arthroplasty is in place.  Anatomic alignment of the osseous and prosthetic structures.  No breakage of the hardware.  IMPRESSION: Left total hip arthroplasty anatomically aligned.   Original Report Authenticated By: Jolaine Click, M.D.    Dg Pelvis Portable  04/17/2012  *RADIOLOGY REPORT*  Clinical Data: Postop left hip replacement  PORTABLE PELVIS  Comparison: 04/17/2012  Findings: Bilateral hip arthroplasties, most recently on the left. Components aligned.  No hardware or definite osseous abnormality. Pelvis intact.  IMPRESSION: Normal alignment.  No complicating feature   Original Report Authenticated By: Judie Petit. Shick, M.D.    Dg Hip Portable 1 View Left  04/17/2012  *RADIOLOGY REPORT*  Clinical Data: Left hip replacement, postop exam  PORTABLE LEFT HIP - 1 VIEW  Comparison: 04/17/2012  Findings: Limited cross-table lateral view  demonstrates the components aligned in the lateral plane.  No definite hardware or osseous abnormality.  Expected postop changes of the soft tissues.  IMPRESSION: Left hip arthroplasty aligned in the lateral projection.  No complicating feature.   Original Report Authenticated By: Judie Petit. Miles Costain, M.D.     Disposition: 01-Home or Self Care      Discharge Orders   Future Orders Complete By Expires     Call MD / Call 911  As directed     Comments:      If you experience chest pain or shortness of breath, CALL 911 and be transported to the hospital emergency room.  If you develope a fever above 101 F, pus (white drainage) or increased drainage or redness at the wound, or calf pain, call your surgeon's office.    Constipation Prevention  As directed     Comments:      Drink plenty of fluids.  Prune juice may be helpful.  You may use a stool softener, such as Colace (over the counter) 100 mg twice a day.  Use MiraLax (over the counter) for constipation as needed.    Diet - low sodium heart healthy  As directed     Discharge wound care:  As directed     Comments:      Keep dressing clean dry and intact until Sunday then may remove dressing and shower    Increase activity slowly as tolerated  As directed     Weight bearing as tolerated  As directed        Follow-up Information   Follow up with Kathryne Hitch, MD In 2 weeks.   Contact information:   [address] [place] [address] [place] [address] [phone]        Signed: Kathryne Hitch 04/20/2012, 7:00 AM

## 2012-12-19 ENCOUNTER — Other Ambulatory Visit: Payer: Self-pay | Admitting: Internal Medicine

## 2012-12-19 DIAGNOSIS — Q619 Cystic kidney disease, unspecified: Secondary | ICD-10-CM

## 2013-01-17 ENCOUNTER — Other Ambulatory Visit: Payer: Medicaid Other

## 2013-01-22 ENCOUNTER — Ambulatory Visit
Admission: RE | Admit: 2013-01-22 | Discharge: 2013-01-22 | Disposition: A | Discharge status: Home / Self Care | Payer: Medicaid Other | Source: Ambulatory Visit | Attending: Internal Medicine | Admitting: Internal Medicine

## 2013-01-22 DIAGNOSIS — Q619 Cystic kidney disease, unspecified: Secondary | ICD-10-CM

## 2013-02-25 ENCOUNTER — Other Ambulatory Visit: Payer: Self-pay | Admitting: Orthopaedic Surgery

## 2013-02-25 DIAGNOSIS — M25551 Pain in right hip: Secondary | ICD-10-CM

## 2013-03-01 ENCOUNTER — Ambulatory Visit
Admission: RE | Admit: 2013-03-01 | Discharge: 2013-03-01 | Disposition: A | Discharge status: Home / Self Care | Payer: Medicaid Other | Source: Ambulatory Visit | Attending: Orthopaedic Surgery | Admitting: Orthopaedic Surgery

## 2013-03-01 DIAGNOSIS — M25551 Pain in right hip: Secondary | ICD-10-CM

## 2013-06-11 ENCOUNTER — Other Ambulatory Visit (HOSPITAL_COMMUNITY): Payer: Self-pay | Admitting: Internal Medicine

## 2013-06-11 DIAGNOSIS — I739 Peripheral vascular disease, unspecified: Secondary | ICD-10-CM

## 2013-06-12 ENCOUNTER — Ambulatory Visit (HOSPITAL_COMMUNITY)
Admission: RE | Admit: 2013-06-12 | Discharge: 2013-06-12 | Disposition: A | Discharge status: Home / Self Care | Payer: Medicaid Other | Source: Ambulatory Visit | Attending: Internal Medicine | Admitting: Internal Medicine

## 2013-06-12 DIAGNOSIS — I739 Peripheral vascular disease, unspecified: Secondary | ICD-10-CM | POA: Diagnosis not present

## 2013-06-12 NOTE — Progress Notes (Signed)
VASCULAR LAB PRELIMINARY  ARTERIAL  ABI completed:  Duplex imaging reveals no evidence of significant stenosis bilaterally.    RIGHT    LEFT    PRESSURE WAVEFORM  PRESSURE WAVEFORM  BRACHIAL 141 triphasic BRACHIAL 140 biphasic  DP 73 Dampened monophasic DP 53 Dampened monophasic  AT   AT    PT 65 Dampened monophasic PT 60 Dampened monophasic  PER   PER    GREAT TOE  NA GREAT TOE  NA    RIGHT LEFT  ABI 0.52 0.43     Smiley Houseman, RVT 06/12/2013, 11:14 AM

## 2013-07-09 ENCOUNTER — Emergency Department (HOSPITAL_COMMUNITY)
Admission: EM | Admit: 2013-07-09 | Discharge: 2013-07-09 | Disposition: A | Discharge status: Home / Self Care | Payer: Medicaid Other | Attending: Emergency Medicine | Admitting: Emergency Medicine

## 2013-07-09 ENCOUNTER — Encounter (HOSPITAL_COMMUNITY): Payer: Self-pay | Admitting: Emergency Medicine

## 2013-07-09 ENCOUNTER — Emergency Department (HOSPITAL_COMMUNITY): Payer: Medicaid Other

## 2013-07-09 DIAGNOSIS — Y9289 Other specified places as the place of occurrence of the external cause: Secondary | ICD-10-CM | POA: Diagnosis not present

## 2013-07-09 DIAGNOSIS — S79929A Unspecified injury of unspecified thigh, initial encounter: Secondary | ICD-10-CM | POA: Diagnosis present

## 2013-07-09 DIAGNOSIS — Z79899 Other long term (current) drug therapy: Secondary | ICD-10-CM | POA: Diagnosis not present

## 2013-07-09 DIAGNOSIS — Y9389 Activity, other specified: Secondary | ICD-10-CM | POA: Diagnosis not present

## 2013-07-09 DIAGNOSIS — S79919A Unspecified injury of unspecified hip, initial encounter: Secondary | ICD-10-CM | POA: Insufficient documentation

## 2013-07-09 DIAGNOSIS — M129 Arthropathy, unspecified: Secondary | ICD-10-CM | POA: Diagnosis not present

## 2013-07-09 DIAGNOSIS — G8929 Other chronic pain: Secondary | ICD-10-CM | POA: Insufficient documentation

## 2013-07-09 DIAGNOSIS — K219 Gastro-esophageal reflux disease without esophagitis: Secondary | ICD-10-CM | POA: Diagnosis not present

## 2013-07-09 DIAGNOSIS — F17200 Nicotine dependence, unspecified, uncomplicated: Secondary | ICD-10-CM | POA: Diagnosis not present

## 2013-07-09 DIAGNOSIS — M25551 Pain in right hip: Secondary | ICD-10-CM

## 2013-07-09 DIAGNOSIS — Z7982 Long term (current) use of aspirin: Secondary | ICD-10-CM | POA: Insufficient documentation

## 2013-07-09 DIAGNOSIS — IMO0002 Reserved for concepts with insufficient information to code with codable children: Secondary | ICD-10-CM | POA: Diagnosis not present

## 2013-07-09 HISTORY — DX: Pain in unspecified hip: M25.559

## 2013-07-09 MED ORDER — MORPHINE SULFATE 4 MG/ML IJ SOLN
6.0000 mg | Freq: Once | INTRAMUSCULAR | Status: AC
  Administered 2013-07-09: 6 mg via INTRAMUSCULAR
  Filled 2013-07-09: qty 2

## 2013-07-09 MED ORDER — HYDROCODONE-ACETAMINOPHEN 5-325 MG PO TABS: ORAL_TABLET | ORAL | Status: DC

## 2013-07-09 NOTE — ED Notes (Signed)
Presents with chronic hip pain, this episode aggravated by hitting right hip on door nob while trying to move out of the way of a tree that was falling. Reports hip pain all time, no defromity noted. CMS intact

## 2013-07-09 NOTE — Discharge Instructions (Signed)
Take vicodin for breakthrough pain, do not drink alcohol, drive, care for children or do other critical tasks while taking vicodin. ° °Please be very careful not to fall! °The pain medication and crutches puts you at risk for falls. Please rest as much as possible and try to not stay alone.  ° °Please follow with your primary care doctor in the next 2 days for a check-up. They must obtain records for further management.  ° °Do not hesitate to return to the Emergency Department for any new, worsening or concerning symptoms.  ° ° ° °

## 2013-07-09 NOTE — ED Provider Notes (Signed)
CSN: [id number]     Arrival date & time 07/09/13  1847 History  This chart was scribed for Wynetta EmeryNicole Cassadee Vanzandt, PA-C working with Rolland PorterMark James, MD by Evon Slackerrance Branch, ED Scribe. This patient was seen in room TR10C/TR10C and the patient's care was started at 6:53 PM.    Chief Complaint  Patient presents with  . Hip Pain   Patient is a 53 y.o. male presenting with hip pain. The history is provided by the patient. No language interpreter was used.  Hip Pain   HPI Comments: James Stevens J Stevens is a 53 y.o. male with a history of chronic hip pain who presents to the Emergency Department complaining of hip pain onset prior to arrival. He states he aggravated his hip today by hitting it on a door knob while trying to move out of the way of a tree falling. He states that initially he was able to walk before the pain became unbearable.  He states that he usually takes hydrocodone but has recently ran out. He states that he has had both hips replaced. He states he has a pain clinic appointment on Monday.   Past Medical History  Diagnosis Date  . Chronic back pain     and right hip  . GERD (gastroesophageal reflux disease)     occ tums  . Arthritis   . Hip pain    Past Surgical History  Procedure Laterality Date  . Joint replacement      rt hip replacement  . Mandible fracture surgery    . Esophagogastroduodenoscopy  09/06/2011    Procedure: ESOPHAGOGASTRODUODENOSCOPY (EGD);  Surgeon: Vertell NovakJames L Edwards Jr., MD;  [facility];  Service: Endoscopy;  Laterality: N/A;  . Savory dilation  09/06/2011    Procedure: SAVORY DILATION;  Surgeon: Vertell NovakJames L Edwards Jr., MD;  [facility];  Service: Endoscopy;  Laterality: N/A;  . Esophageal dilation    . Total hip arthroplasty Left 04/17/2012    Procedure: LEFT TOTAL HIP ARTHROPLASTY ANTERIOR APPROACH;  Surgeon: Kathryne Hitchhristopher Y Blackman, MD;  [facility];  Service: Orthopedics;  Laterality: Left;   History reviewed. No pertinent family  history. History  Substance Use Topics  . Smoking status: Current Every Day Smoker -- 0.50 packs/day for 34 years    Types: Cigarettes  . Smokeless tobacco: Not on file  . Alcohol Use: 1.8 oz/week    3 Cans of beer per week     Comment: 3x a week     Review of Systems  Musculoskeletal: Positive for arthralgias and gait problem.   A complete 10 system review of systems was obtained and all systems are negative except as noted in the HPI and PMH.   Allergies  Ibuprofen and Tramadol  Home Medications   Prior to Admission medications   Medication Sig Start Date End Date Taking? Authorizing Provider  aspirin EC 325 MG tablet Take 1 tablet (325 mg total) by mouth 2 (two) times daily. 04/19/12   Richardean CanalGilbert Clark, PA-C  ferrous sulfate 325 (65 FE) MG tablet Take 1 tablet (325 mg total) by mouth 3 (three) times daily after meals. 04/19/12   Richardean CanalGilbert Clark, PA-C  HYDROcodone-acetaminophen (NORCO/VICODIN) 5-325 MG per tablet Take 1-2 tablets by mouth every 6 hours as needed for pain. 07/09/13   Renan Danese, PA-C  methocarbamol (ROBAXIN) 500 MG tablet Take 1 tablet (500 mg total) by mouth every 6 (six) hours as needed. 04/19/12   Richardean CanalGilbert Clark, PA-C  oxyCODONE-acetaminophen (ROXICET) 5-325 MG per tablet Take 1-2  tablets by mouth every 4 (four) hours as needed for pain. 04/19/12   Richardean CanalGilbert Clark, PA-C   Triage Vitals: BP 106/88  Pulse 109  Temp(Src) 98.3 F (36.8 C) (Oral)  Resp 18  SpO2 99%  Physical Exam  Nursing note and vitals reviewed. Constitutional: He is oriented to person, place, and time. He appears well-developed and well-nourished. No distress.  HENT:  Head: Normocephalic and atraumatic.  Mouth/Throat: Oropharynx is clear and moist.  Eyes: Conjunctivae and EOM are normal. Pupils are equal, round, and reactive to light.  Neck: Normal range of motion. Neck supple.  Cardiovascular: Normal rate.   Pulmonary/Chest: Effort normal and breath sounds normal. No stridor.  Abdominal:  Soft. Bowel sounds are normal.  Musculoskeletal: Normal range of motion.  No shortening or deformity to left lower extremity. Neurovascularly intact. Patient is diffusely exquisitely tender palpation along the right hip. Is able to abduct the hip.  Neurological: He is alert and oriented to person, place, and time.  Psychiatric: He has a normal mood and affect.    ED Course  Procedures (including critical care time) DIAGNOSTIC STUDIES: Oxygen Saturation is 99% on RA, normal by my interpretation.    COORDINATION OF CARE:    Labs Review Labs Reviewed - No data to display  Imaging Review Dg Hip Complete Right  07/09/2013   CLINICAL DATA:  Bilateral hip pain for years  EXAM: RIGHT HIP - COMPLETE 2+ VIEW  COMPARISON:  02/2013  FINDINGS: Pelvic bones are intact. Bilateral hip replacements in anticipated position. No evidence of fracture of the device or of underlying bone. Heterotopic calcification about the right greater trochanter, mild.  IMPRESSION: No acute findings   Electronically Signed   By: Esperanza Heiraymond  Rubner M.D.   On: 07/09/2013 19:43     EKG Interpretation None      MDM   Final diagnoses:  Chronic hip pain, right   Filed Vitals:   07/09/13 1849 07/09/13 2007  BP: 106/88 126/83  Pulse: 109 96  Temp: 98.3 F (36.8 C) 98 F (36.7 C)  TempSrc: Oral Oral  Resp: 18 16  SpO2: 99% 99%    Medications  morphine 4 MG/ML injection 6 mg (6 mg Intramuscular Given 07/09/13 1858)    James Stevens is a 53 y.o. male presenting with right hip pain. Patient is in bilateral. X-rays negative. Patient given crutches and asked to follow with orthopedist and pain management.  Evaluation does not show pathology that would require ongoing emergent intervention or inpatient treatment. Pt is hemodynamically stable and mentating appropriately. Discussed findings and plan with patient/guardian, who agrees with care plan. All questions answered. Return precautions discussed and outpatient  follow up given.   Discharge Medication List as of 07/09/2013  7:40 PM    START taking these medications   Details  HYDROcodone-acetaminophen (NORCO/VICODIN) 5-325 MG per tablet Take 1-2 tablets by mouth every 6 hours as needed for pain., Print             I personally performed the services described in this documentation, which was scribed in my presence. The recorded information has been reviewed and is accurate.    Wynetta Emeryicole Prestin Munch, PA-C 07/10/13 [phone]

## 2013-07-16 NOTE — ED Provider Notes (Signed)
Medical screening examination/treatment/procedure(s) were performed by non-physician practitioner and as supervising physician I was immediately available for consultation/collaboration.   EKG Interpretation None        Rolland PorterMark James, MD 07/16/13 [phone]

## 2013-08-28 ENCOUNTER — Encounter: Payer: Self-pay | Admitting: Vascular Surgery

## 2013-08-29 ENCOUNTER — Telehealth: Payer: Self-pay | Admitting: Vascular Surgery

## 2013-08-29 ENCOUNTER — Ambulatory Visit (INDEPENDENT_AMBULATORY_CARE_PROVIDER_SITE_OTHER): Payer: Medicaid Other | Admitting: Vascular Surgery

## 2013-08-29 ENCOUNTER — Encounter: Payer: Self-pay | Admitting: Vascular Surgery

## 2013-08-29 ENCOUNTER — Other Ambulatory Visit: Payer: Self-pay

## 2013-08-29 VITALS — BP 141/91 | HR 65 | Ht 70.8 in | Wt 143.1 lb

## 2013-08-29 DIAGNOSIS — I739 Peripheral vascular disease, unspecified: Secondary | ICD-10-CM | POA: Insufficient documentation

## 2013-08-29 DIAGNOSIS — I7409 Other arterial embolism and thrombosis of abdominal aorta: Secondary | ICD-10-CM

## 2013-08-29 DIAGNOSIS — I70219 Atherosclerosis of native arteries of extremities with intermittent claudication, unspecified extremity: Secondary | ICD-10-CM

## 2013-08-29 NOTE — Progress Notes (Signed)
[facility] [place] HISTORY AND PHYSICAL   History of Present Illness:  James Stevens is a 53 y.o. year old male who presents for evaluation of bilateral lower extremity claudication calf and hip with left foot rest pain. The James Stevens states he has had off and on problems with his hips and thighs for 4 years on the right after hip replacement at least 2 years on the left after hip replacement. He has pain in his hips and thighs after walking less than half a block. He also develops claudication symptoms in the calf bilaterally. He is also develops rest pain in the left foot that has been going on for approximate 6 months. He is a smoker. He was counseled today greater than 3 minutes and smoking cessation. He denies any nonhealing wounds on the feet. Other medical problems include arthritis, reflux, schizophrenia/bipolar all of which are currently stable. He was recently started on Pletal by Dr. Cathlean Marseilles.  Past Medical History  Diagnosis Date  . Chronic back pain     and right hip  . GERD (gastroesophageal reflux disease)     occ tums  . Arthritis   . Hip pain   . Schizophrenia   . Bipolar affective     Past Surgical History  Procedure Laterality Date  . Joint replacement      rt hip replacement  . Mandible fracture surgery    . Esophagogastroduodenoscopy  09/06/2011    Procedure: ESOPHAGOGASTRODUODENOSCOPY (EGD);  Surgeon: Vertell Novak., MD;  [facility];  Service: Endoscopy;  Laterality: N/A;  . Savory dilation  09/06/2011    Procedure: SAVORY DILATION;  Surgeon: Vertell Novak., MD;  [facility];  Service: Endoscopy;  Laterality: N/A;  . Esophageal dilation    . Total hip arthroplasty Left 04/17/2012    Procedure: LEFT TOTAL HIP ARTHROPLASTY ANTERIOR APPROACH;  Surgeon: Kathryne Hitch, MD;  Location: [facility];  Service: Orthopedics;  Laterality: Left;    Social History History  Substance Use Topics  . Smoking status: Current Every Day  Smoker -- 0.50 packs/day for 34 years    Types: Cigarettes  . Smokeless tobacco: Not on file  . Alcohol Use: 1.8 oz/week    3 Cans of beer per week     Comment: 3x a week     Family History No family history on file.  Allergies  Allergies  Allergen Reactions  . Ibuprofen     Gastric irritation  . Tramadol     itching     Current Outpatient Prescriptions  Medication Sig Dispense Refill  . cilostazol (PLETAL) 100 MG tablet Take 100 mg by mouth 2 (two) times daily.      . methocarbamol (ROBAXIN) 500 MG tablet Take 1 tablet (500 mg total) by mouth every 6 (six) hours as needed.  40 tablet  1  . tiZANidine (ZANAFLEX) 4 MG capsule Take 4 mg by mouth as needed for muscle spasms.      Marland Kitchen aspirin EC 325 MG tablet Take 1 tablet (325 mg total) by mouth 2 (two) times daily.  45 tablet  0  . ferrous sulfate 325 (65 FE) MG tablet Take 1 tablet (325 mg total) by mouth 3 (three) times daily after meals.  90 tablet  0  . HYDROcodone-acetaminophen (NORCO/VICODIN) 5-325 MG per tablet Take 1-2 tablets by mouth every 6 hours as needed for pain.  15 tablet  0  . oxyCODONE-acetaminophen (ROXICET) 5-325 MG per tablet Take 1-2 tablets by  mouth every 4 (four) hours as needed for pain.  60 tablet  0   No current facility-administered medications for this visit.    ROS:   General:  No weight loss, Fever, chills  HEENT: No recent headaches, no nasal bleeding, no visual changes, no sore throat  Neurologic: No dizziness, blackouts, seizures. No recent symptoms of stroke or mini- stroke. No recent episodes of slurred speech, or temporary blindness.  Cardiac: No recent episodes of chest pain/pressure, no shortness of breath at rest.  No shortness of breath with exertion.  Denies history of atrial fibrillation or irregular heartbeat  Vascular: + history of rest pain in feet.  + history of claudication.  No history of non-healing ulcer, No history of DVT   Pulmonary: No home oxygen, no productive cough,  no hemoptysis,  No asthma or wheezing  Musculoskeletal:  [x ] Arthritis, [x ] Low back pain,  [x ] Joint pain  Hematologic:No history of hypercoagulable state.  No history of easy bleeding.  No history of anemia  Gastrointestinal: No hematochezia or melena,  + gastroesophageal reflux, no trouble swallowing  Urinary: [ ]  chronic Kidney disease, [ ]  on HD - [ ]  MWF or [ ]  TTHS, [ ]  Burning with urination, [ ]  Frequent urination, [ ]  Difficulty urinating;   Skin: No rashes  Psychological: No history of anxiety,  No history of depression   Physical Examination  Filed Vitals:   08/29/13 1037  BP: 141/91  Pulse: 65  Height: 5' 10.8" (1.798 m)  Weight: 143 lb 1.6 oz (64.91 kg)  SpO2: 100%    Body mass index is 20.08 kg/(m^2).  General:  Alert and oriented, no acute distress HEENT: Normal Neck: No bruit or JVD Pulmonary: Clear to auscultation bilaterally Cardiac: Regular Rate and Rhythm without murmur Abdomen: Soft, non-tender, non-distended, no mass, no scars, thin Skin: No rash Extremity Pulses:  2+ radial, brachial, absent femoral, dorsalis pedis, posterior tibial pulses bilaterally Musculoskeletal: No deformity or edema  Neurologic: Upper and lower extremity motor 5/5 and symmetric  DATA:  Noninvasive arterial study from [facility] dated 06/12/2013 is reviewed today. ABI on the right 0.5 to left 0.43 monophasic waveforms from the common femoral distally   ASSESSMENT:  James Stevens with bilateral lower extremity claudication symptoms in early rest pain left foot. 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   PLAN:  CT Angio with bilateral lower extremity runoff. The James Stevens will start taking one aspirin daily. The James Stevens will followup with me after his CT scan for further evaluation. The James Stevens was counseled to stop smoking today. He was informed that he is at risk of stroke heart attack and limb loss if he continues to smoke long-term.  Fabienne Brunsharles Taiesha Bovard,  MD [facility] [place] Office: [phone] Pager: [phone]

## 2013-08-29 NOTE — Telephone Encounter (Signed)
Spoke with pt  Gave him information regarding upcoming appt: [facility] 2:10 pm on 09/05/13, followed by appt with Dr. Darrick PennaFields. Pt verbalized understanding.

## 2013-08-29 NOTE — Addendum Note (Signed)
Addended by: Sharee PimpleMCCHESNEY, MARILYN K on: 08/29/2013 03:03 PM   Modules accepted: Orders

## 2013-09-04 ENCOUNTER — Encounter: Payer: Self-pay | Admitting: Vascular Surgery

## 2013-09-05 ENCOUNTER — Encounter: Payer: Self-pay | Admitting: Vascular Surgery

## 2013-09-05 ENCOUNTER — Other Ambulatory Visit: Payer: Medicaid Other

## 2013-09-05 ENCOUNTER — Ambulatory Visit (INDEPENDENT_AMBULATORY_CARE_PROVIDER_SITE_OTHER): Payer: Medicaid Other | Admitting: Vascular Surgery

## 2013-09-05 ENCOUNTER — Ambulatory Visit: Payer: Medicaid Other | Admitting: Vascular Surgery

## 2013-09-05 ENCOUNTER — Ambulatory Visit
Admission: RE | Admit: 2013-09-05 | Discharge: 2013-09-05 | Disposition: A | Discharge status: Home / Self Care | Payer: Medicaid Other | Source: Ambulatory Visit | Attending: Vascular Surgery | Admitting: Vascular Surgery

## 2013-09-05 VITALS — BP 114/89 | HR 83 | Ht 70.8 in | Wt 139.5 lb

## 2013-09-05 DIAGNOSIS — I70219 Atherosclerosis of native arteries of extremities with intermittent claudication, unspecified extremity: Secondary | ICD-10-CM

## 2013-09-05 DIAGNOSIS — Z01810 Encounter for preprocedural cardiovascular examination: Secondary | ICD-10-CM

## 2013-09-05 DIAGNOSIS — I739 Peripheral vascular disease, unspecified: Secondary | ICD-10-CM

## 2013-09-05 MED ORDER — IOHEXOL 350 MG/ML SOLN
140.0000 mL | Freq: Once | INTRAVENOUS | Status: AC | PRN
  Administered 2013-09-05: 140 mL via INTRAVENOUS

## 2013-09-05 NOTE — Addendum Note (Signed)
Addended by: Sharee Pimple on: 09/05/2013 05:11 PM   Modules accepted: Orders

## 2013-09-05 NOTE — Progress Notes (Signed)
[facility] [place] HISTORY AND PHYSICAL    History of Present Illness:  Patient is a 53 y.o. year old male who presents for evaluation of bilateral lower extremity claudication calf and hip with left foot rest pain. The patient states he has had off and on problems with his hips and thighs for 4 years on the right after hip replacement at least 2 years on the left after hip replacement. He has pain in his hips and thighs after walking less than half a block. He also develops claudication symptoms in the calf bilaterally. He is also develops rest pain in the left foot that has been going on for approximate 6 months. He is a smoker. He was counseled today greater than 3 minutes and smoking cessation. He denies any nonhealing wounds on the feet. Other medical problems include arthritis, reflux, schizophrenia/bipolar all of which are currently stable. He was recently started on Pletal by Dr. Cathlean Marseilles.    Past Medical History   Diagnosis  Date   .  Chronic back pain         and right hip   .  GERD (gastroesophageal reflux disease)         occ tums   .  Arthritis     .  Hip pain     .  Schizophrenia     .  Bipolar affective         Past Surgical History   Procedure  Laterality  Date   .  Joint replacement           rt hip replacement   .  Mandible fracture surgery       .  Esophagogastroduodenoscopy    09/06/2011       Procedure: ESOPHAGOGASTRODUODENOSCOPY (EGD);  Surgeon: Vertell Novak., MD;  Location: [facility];  Service: Endoscopy;  Laterality: N/A;   .  Savory dilation    09/06/2011       Procedure: SAVORY DILATION;  Surgeon: Vertell Novak., MD;  [facility];  Service: Endoscopy;  Laterality: N/A;   .  Esophageal dilation       .  Total hip arthroplasty  Left  04/17/2012       Procedure: LEFT TOTAL HIP ARTHROPLASTY ANTERIOR APPROACH;  Surgeon: Kathryne Hitch, MD;  [facility]: [facility] OR;  Service: Orthopedics;  Laterality: Left;     Social  History History   Substance Use Topics   .  Smoking status:  Current Every Day Smoker -- 0.50 packs/day for 34 years       Types:  Cigarettes   .  Smokeless tobacco:  Not on file   .  Alcohol Use:  1.8 oz/week       3 Cans of beer per week         Comment: 3x a week      Family History No family history on file.  Allergies    Allergies   Allergen  Reactions   .  Ibuprofen         Gastric irritation   .  Tramadol         itching        Current Outpatient Prescriptions   Medication  Sig  Dispense  Refill   .  cilostazol (PLETAL) 100 MG tablet  Take 100 mg by mouth 2 (two) times daily.         .  methocarbamol (ROBAXIN) 500 MG tablet  Take 1 tablet (500  mg total) by mouth every 6 (six) hours as needed.   40 tablet   1   .  tiZANidine (ZANAFLEX) 4 MG capsule  Take 4 mg by mouth as needed for muscle spasms.         Marland Kitchen  aspirin EC 325 MG tablet  Take 1 tablet (325 mg total) by mouth 2 (two) times daily.   45 tablet   0   .  ferrous sulfate 325 (65 FE) MG tablet  Take 1 tablet (325 mg total) by mouth 3 (three) times daily after meals.   90 tablet   0   .  HYDROcodone-acetaminophen (NORCO/VICODIN) 5-325 MG per tablet  Take 1-2 tablets by mouth every 6 hours as needed for pain.   15 tablet   0   .  oxyCODONE-acetaminophen (ROXICET) 5-325 MG per tablet  Take 1-2 tablets by mouth every 4 (four) hours as needed for pain.   60 tablet   0      No current facility-administered medications for this visit.     ROS:    General:  No weight loss, Fever, chills  HEENT: No recent headaches, no nasal bleeding, no visual changes, no sore throat  Neurologic: No dizziness, blackouts, seizures. No recent symptoms of stroke or mini- stroke. No recent episodes of slurred speech, or temporary blindness.  Cardiac: No recent episodes of chest pain/pressure, no shortness of breath at rest.  No shortness of breath with exertion.  Denies history of atrial fibrillation or irregular heartbeat  Vascular: +  history of rest pain in feet.  + history of claudication.  No history of non-healing ulcer, No history of DVT    Pulmonary: No home oxygen, no productive cough, no hemoptysis,  No asthma or wheezing  Musculoskeletal:  [x ] Arthritis, [x ] Low back pain,  [x ] Joint pain  Hematologic:No history of hypercoagulable state.  No history of easy bleeding.  No history of anemia  Gastrointestinal: No hematochezia or melena,  + gastroesophageal reflux, no trouble swallowing  Urinary:  chronic Kidney disease,  on HD -  MWF or  TTHS,  Burning with urination,  Frequent urination,  Difficulty urinating;    Skin: No rashes  Psychological: No history of anxiety,  No history of depression   Physical Examination  Filed Vitals:   09/05/13 1504  BP: 114/89  Pulse: 83  Height: 5' 10.8" (1.798 m)  Weight: 139 lb 8 oz (63.277 kg)  SpO2: 100%   General:  Alert and oriented, no acute distress HEENT: Normal Neck: No bruit or JVD Pulmonary: Clear to auscultation bilaterally Cardiac: Regular Rate and Rhythm without murmur Abdomen: Soft, non-tender, non-distended, no mass, no scars, thin Skin: No rash Extremity Pulses:  2+ radial, brachial, absent femoral, dorsalis pedis, posterior tibial pulses bilaterally Musculoskeletal: No deformity or edema     Neurologic: Upper and lower extremity motor 5/5 and symmetric  DATA:  Noninvasive arterial study from [facility] dated 06/12/2013 is reviewed today. ABI on the right 0.5 to left 0.43 monophasic waveforms from the common femoral distally.  A CT angiogram is reviewed today this shows an infrarenal aortic occlusion with occlusion of the common iliacs bilaterally, the right external and internal iliac artery reconstitutes. The left external iliac artery is occluded. 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 He  needs cardiac risk stratification prior to this. We will schedule his aortobifem after cardiac evaluation.  Fabienne Bruns, MD [facility] [place] Office: [phone] Pager: [phone]

## 2013-09-09 ENCOUNTER — Other Ambulatory Visit: Payer: Self-pay

## 2013-09-18 ENCOUNTER — Encounter (HOSPITAL_COMMUNITY): Payer: Self-pay | Admitting: Pharmacy Technician

## 2013-09-19 ENCOUNTER — Ambulatory Visit: Payer: Medicaid Other | Admitting: Vascular Surgery

## 2013-09-20 ENCOUNTER — Ambulatory Visit (HOSPITAL_COMMUNITY)
Admission: RE | Admit: 2013-09-20 | Discharge: 2013-09-20 | Disposition: A | Discharge status: Home / Self Care | Payer: Medicaid Other | Source: Ambulatory Visit | Attending: Anesthesiology | Admitting: Anesthesiology

## 2013-09-20 ENCOUNTER — Other Ambulatory Visit: Payer: Self-pay

## 2013-09-20 ENCOUNTER — Encounter (HOSPITAL_COMMUNITY)
Admission: RE | Admit: 2013-09-20 | Discharge: 2013-09-20 | Disposition: A | Discharge status: Home / Self Care | Payer: Medicaid Other | Source: Ambulatory Visit | Attending: Vascular Surgery | Admitting: Vascular Surgery

## 2013-09-20 ENCOUNTER — Encounter (HOSPITAL_COMMUNITY): Payer: Medicaid Other | Admitting: Vascular Surgery

## 2013-09-20 ENCOUNTER — Encounter (HOSPITAL_COMMUNITY): Payer: Self-pay

## 2013-09-20 ENCOUNTER — Inpatient Hospital Stay (HOSPITAL_COMMUNITY): Payer: Medicaid Other | Admitting: Certified Registered Nurse Anesthetist

## 2013-09-20 DIAGNOSIS — Z01818 Encounter for other preprocedural examination: Secondary | ICD-10-CM | POA: Diagnosis not present

## 2013-09-20 DIAGNOSIS — Z87891 Personal history of nicotine dependence: Secondary | ICD-10-CM | POA: Diagnosis not present

## 2013-09-20 HISTORY — DX: Major depressive disorder, single episode, unspecified: F32.9

## 2013-09-20 HISTORY — DX: Headache: R51

## 2013-09-20 HISTORY — DX: Unspecified atherosclerosis: I70.90

## 2013-09-20 HISTORY — DX: Depression, unspecified: F32.A

## 2013-09-20 LAB — CBC
HCT: 44.1 % (ref 39.0–52.0)
HEMOGLOBIN: 15.3 g/dL (ref 13.0–17.0)
MCH: 32.1 pg (ref 26.0–34.0)
MCHC: 34.7 g/dL (ref 30.0–36.0)
MCV: 92.6 fL (ref 78.0–100.0)
Platelets: 199 10*3/uL (ref 150–400)
RBC: 4.76 MIL/uL (ref 4.22–5.81)
RDW: 14 % (ref 11.5–15.5)
WBC: 7.7 10*3/uL (ref 4.0–10.5)

## 2013-09-20 LAB — URINALYSIS, ROUTINE W REFLEX MICROSCOPIC
Bilirubin Urine: NEGATIVE
GLUCOSE, UA: NEGATIVE mg/dL
Hgb urine dipstick: NEGATIVE
KETONES UR: NEGATIVE mg/dL
NITRITE: NEGATIVE
PROTEIN: NEGATIVE mg/dL
Specific Gravity, Urine: 1.018 (ref 1.005–1.030)
Urobilinogen, UA: 0.2 mg/dL (ref 0.0–1.0)
pH: 5.5 (ref 5.0–8.0)

## 2013-09-20 LAB — BLOOD GAS, ARTERIAL
ACID-BASE EXCESS: 0.1 mmol/L (ref 0.0–2.0)
Bicarbonate: 24.6 mEq/L — ABNORMAL HIGH (ref 20.0–24.0)
Drawn by: 20634
FIO2: 0.21 %
O2 Saturation: 96.7 %
PCO2 ART: 42.3 mmHg (ref 35.0–45.0)
Patient temperature: 98.6
TCO2: 25.9 mmol/L (ref 0–100)
pH, Arterial: 7.382 (ref 7.350–7.450)
pO2, Arterial: 87.2 mmHg (ref 80.0–100.0)

## 2013-09-20 LAB — URINE MICROSCOPIC-ADD ON

## 2013-09-20 LAB — COMPREHENSIVE METABOLIC PANEL
ALK PHOS: 57 U/L (ref 39–117)
ALT: 67 U/L — AB (ref 0–53)
AST: 53 U/L — ABNORMAL HIGH (ref 0–37)
Albumin: 3.7 g/dL (ref 3.5–5.2)
Anion gap: 14 (ref 5–15)
BUN: 10 mg/dL (ref 6–23)
CO2: 20 mEq/L (ref 19–32)
Calcium: 9.3 mg/dL (ref 8.4–10.5)
Chloride: 107 mEq/L (ref 96–112)
Creatinine, Ser: 1.17 mg/dL (ref 0.50–1.35)
GFR calc Af Amer: 81 mL/min — ABNORMAL LOW (ref >90)
GFR calc non Af Amer: 70 mL/min — ABNORMAL LOW (ref >90)
Glucose, Bld: 95 mg/dL (ref 70–99)
POTASSIUM: 4.7 meq/L (ref 3.7–5.3)
SODIUM: 141 meq/L (ref 137–147)
TOTAL PROTEIN: 7 g/dL (ref 6.0–8.3)
Total Bilirubin: 0.4 mg/dL (ref 0.3–1.2)

## 2013-09-20 LAB — SURGICAL PCR SCREEN
MRSA, PCR: NEGATIVE
STAPHYLOCOCCUS AUREUS: NEGATIVE

## 2013-09-20 LAB — APTT: APTT: 128 s — AB (ref 24–37)

## 2013-09-20 LAB — PROTIME-INR
INR: 1.08 (ref 0.00–1.49)
Prothrombin Time: 14 seconds (ref 11.6–15.2)

## 2013-09-20 NOTE — Pre-Procedure Instructions (Signed)
[facility]equav[facility]  09/20/2013   Your procedure is scheduled on: Wednesday, Sept. 16, 2015  Report to [facility] Admitting at 6:30 AM.  Call this number if you have problems the morning of surgery: [phone]   Remember:   Do not eat food or drink liquids after midnight Tuesday, Sept. 15, 2015   Take these medicines the morning of surgery with A SIP OF WATER: cilostazol (PLETAL), if needed:  HYDROcodone for pain Stop taking Aspirin, vitamins, and herbal medications. Do not take any NSAIDs ie: Ibuprofen, Advil, Naproxen or any medication containing Aspirin ( meloxicam (MOBIC); stop now.    Do not wear jewelry, make-up or nail polish.  Do not wear lotions, powders, or perfumes. You may not wear deodorant.  Do not shave 48 hours prior to surgery. Men may shave face and neck.  Do not bring valuables to the hospital.  [facility] is not responsible for any belongings or valuables.               Contacts, dentures or bridgework may not be worn into surgery.  Leave suitcase in the car. After surgery it may be brought to your room.  For patients admitted to the hospital, discharge time is determined by your treatment team.               Patients discharged the day of surgery will not be allowed to drive home.  Name and phone number of your driver:   Special Instructions:  Special Instructions:Special Instructions: [facility] - Preparing for Surgery  Before surgery, you can play an important role.  Because skin is not sterile, your skin needs to be as free of germs as possible.  You can reduce the number of germs on you skin by washing with CHG (chlorahexidine gluconate) soap before surgery.  CHG is an antiseptic cleaner which kills germs and bonds with the skin to continue killing germs even after washing.  Please DO NOT use if you have an allergy to CHG or antibacterial soaps.  If your skin becomes reddened/irritated stop using the CHG and inform your nurse when you arrive at  Short Stay.  Do not shave (including legs and underarms) for at least 48 hours prior to the first CHG shower.  You may shave your face.  Please follow these instructions carefully:   1.  Shower with CHG Soap the night before surgery and the morning of Surgery.  2.  If you choose to wash your hair, wash your hair first as usual with your normal shampoo.  3.  After you shampoo, rinse your hair and body thoroughly to remove the Shampoo.  4.  Use CHG as you would any other liquid soap.  You can apply chg directly  to the skin and wash gently with scrungie or a clean washcloth.  5.  Apply the CHG Soap to your body ONLY FROM THE NECK DOWN.  Do not use on open wounds or open sores.  Avoid contact with your eyes, ears, mouth and genitals (private parts).  Wash genitals (private parts) with your normal soap.  6.  Wash thoroughly, paying special attention to the area where your surgery will be performed.  7.  Thoroughly rinse your body with warm water from the neck down.  8.  DO NOT shower/wash with your normal soap after using and rinsing off the CHG Soap.  9.  Pat yourself dry with a clean towel.  10.  Wear clean pajamas.            11.  Place clean sheets on your bed the night of your first shower and do not sleep with pets.  Day of Surgery  Do not apply any lotions/deodorants the morning of surgery.  Please wear clean clothes to the hospital/surgery center.   Please read over the following fact sheets that you were given: Pain Booklet, Coughing and Deep Breathing, Blood Transfusion Information, MRSA Information and Surgical Site Infection Prevention

## 2013-09-20 NOTE — Progress Notes (Addendum)
Pt denies SOB, chest pain, and being under the care of a cardiologist. Pt denies having a chest x ray and EKG within the last year. Pt denies having a stress test, echo, and cardiac cath. Spoke with Judeth Cornfield at Dr. 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 Pt made aware to stop using illicit drugs ( cocaine) and the consequences of continued use. Pt gave his word that he'll stop. Judeth Cornfield also asked to make MD aware that pt UA was abnormal (Trichomonas present and small leukocytes). Pt chart forwarded to [place], [place]  ( anesthesia) for review.

## 2013-09-23 ENCOUNTER — Other Ambulatory Visit: Payer: Self-pay

## 2013-09-23 ENCOUNTER — Ambulatory Visit: Payer: Medicaid Other | Admitting: Cardiovascular Disease

## 2013-09-23 DIAGNOSIS — A599 Trichomoniasis, unspecified: Secondary | ICD-10-CM

## 2013-09-23 MED ORDER — METRONIDAZOLE 500 MG PO TABS: ORAL_TABLET | ORAL | Status: DC

## 2013-09-23 NOTE — Progress Notes (Addendum)
Anesthesia chart review: Patient is a 53 year old male scheduled for AFBG on 09/25/13 by Dr. Darrick Penna.  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 PCP is Dr. Concepcion Elk. Dr. Darrick Penna recommended cardiology evaluation preoperatively for risk stratification. He was scheduled to see Dr. Elease Hashimoto this morning but was a no show due to lack of transportation.  Dr. Evelina Dun office is working on arranging him transportation and is in communication with [facility] to see if his appointment can be rescheduled asap.  EKG on 09/20/13 showed NSR. RSR prime pattern in V1, V2.  CXR on 09/20/13 showed: No radiographic evidence of acute cardiopulmonary disease. Changes of atherosclerosis. Hyperinflation of the lungs compatible with COPD, with emphysematous changes better seen on prior abdominal CT 09/05/2013.  Preoperative labs noted.  Cr 1.17.  AST/ALT mildly elevated at 53/67. CBC, PT/INR WNL. PTT elevated at 128 (called to [employer], RN).  UA showed trichomonas. Notes from this morning indicate that he is being treated with 2 gm Flagyl (see note from Dehart, RN).  He also admitted to using cocaine three times a week.  He was told to refrain from use, particularly with upcoming surgery.  Dr. Darrick Penna' office notified and UDS has been ordered for the day of surgery. I'll also order a repeat PTT to re-evaluate for persistent elevation (no clear etiology of elevated PTT noted by his medication list).  Plans to proceed will depend on cardiology recommendations.  Any acute drug intoxication would also be reason to delay surgery.  Hopefully he will be compliant.    Velna Ochs [facility] Stay [facility]/Anesthesiology Phone [phone] 09/23/2013 11:54 AM  Addendum:  Patient was seen by cardiologist Dr. Jacinto Halim for preoperative evaluation this afternoon.  He was felt to be "at least moderate risk if not high risk for perioperative cardiovascular complications including death."  He was started on ASA 81 mg, carvedilol, and atorvastatin with plans to follow-up in 2-3 weeks.    Velna Ochs [facility] Stay [facility]/Anesthesiology Phone [phone] 09/24/2013 4:45 PM

## 2013-09-23 NOTE — Progress Notes (Signed)
Notified by pre-op nurse of trichimoniasis in patient's urinalysis. Discussed with S. Nickel, NP; received verbal order for, 2 grams Flagyl by mouth once. Per S. Nickel, NP, patient should repeat urinalysis one week after taking Flagyl by primary care provider or Health Department. Advised patient to abstain from sexual intercourse for one week after he and sexual partners are treated. Also, advised patient that he should be screened for other sexually transmitted infections. Patient verbalized understanding.

## 2013-09-24 ENCOUNTER — Ambulatory Visit: Payer: Medicaid Other | Admitting: Cardiology

## 2013-09-24 MED ORDER — CEFUROXIME SODIUM 1.5 G IJ SOLR
1.5000 g | INTRAMUSCULAR | Status: DC
  Filled 2013-09-24: qty 1.5

## 2013-09-24 NOTE — Progress Notes (Signed)
Call to Dr. Verl Dicker office, spoke with [place].  Pt. Is in their office now, after he is seen by Dr. Jacinto Halim this afternoon, Dr. Jacinto Halim will fax the report to [facility] at no. [phone].

## 2013-09-25 ENCOUNTER — Encounter (HOSPITAL_COMMUNITY)
Admission: RE | Disposition: A | Discharge status: Home / Self Care | Payer: Self-pay | Source: Ambulatory Visit | Attending: Vascular Surgery

## 2013-09-25 ENCOUNTER — Encounter (HOSPITAL_COMMUNITY): Payer: Self-pay | Admitting: Certified Registered Nurse Anesthetist

## 2013-09-25 ENCOUNTER — Ambulatory Visit (HOSPITAL_COMMUNITY)
Admission: RE | Admit: 2013-09-25 | Discharge: 2013-09-25 | Disposition: A | Discharge status: Home / Self Care | Payer: Medicaid Other | Source: Ambulatory Visit | Attending: Vascular Surgery | Admitting: Vascular Surgery

## 2013-09-25 DIAGNOSIS — I745 Embolism and thrombosis of iliac artery: Secondary | ICD-10-CM | POA: Diagnosis not present

## 2013-09-25 DIAGNOSIS — Z5309 Procedure and treatment not carried out because of other contraindication: Secondary | ICD-10-CM | POA: Diagnosis not present

## 2013-09-25 DIAGNOSIS — F1410 Cocaine abuse, uncomplicated: Secondary | ICD-10-CM | POA: Diagnosis not present

## 2013-09-25 DIAGNOSIS — I7409 Other arterial embolism and thrombosis of abdominal aorta: Secondary | ICD-10-CM | POA: Insufficient documentation

## 2013-09-25 LAB — PREPARE RBC (CROSSMATCH)

## 2013-09-25 LAB — RAPID URINE DRUG SCREEN, HOSP PERFORMED
Amphetamines: NOT DETECTED
BARBITURATES: NOT DETECTED
Benzodiazepines: NOT DETECTED
COCAINE: POSITIVE — AB
OPIATES: POSITIVE — AB
Tetrahydrocannabinol: NOT DETECTED

## 2013-09-25 LAB — APTT: APTT: 35 s (ref 24–37)

## 2013-09-25 SURGERY — CREATION, BYPASS, ARTERIAL, AORTA TO FEMORAL, BILATERAL, USING GRAFT
Anesthesia: General

## 2013-09-25 MED ORDER — SODIUM CHLORIDE 0.9 % IV SOLN: INTRAVENOUS | Status: DC

## 2013-09-25 MED ORDER — EPHEDRINE SULFATE 50 MG/ML IJ SOLN
INTRAMUSCULAR | Status: AC
  Filled 2013-09-25: qty 1

## 2013-09-25 MED ORDER — LIDOCAINE HCL (CARDIAC) 20 MG/ML IV SOLN
INTRAVENOUS | Status: AC
  Filled 2013-09-25: qty 5

## 2013-09-25 MED ORDER — ONDANSETRON HCL 4 MG/2ML IJ SOLN
INTRAMUSCULAR | Status: AC
  Filled 2013-09-25: qty 2

## 2013-09-25 MED ORDER — FENTANYL CITRATE 0.05 MG/ML IJ SOLN
INTRAMUSCULAR | Status: AC
  Filled 2013-09-25: qty 5

## 2013-09-25 MED ORDER — ROCURONIUM BROMIDE 50 MG/5ML IV SOLN
INTRAVENOUS | Status: AC
  Filled 2013-09-25: qty 1

## 2013-09-25 MED ORDER — CHLORHEXIDINE GLUCONATE CLOTH 2 % EX PADS: 6.0000 | MEDICATED_PAD | Freq: Once | CUTANEOUS | Status: DC

## 2013-09-25 MED ORDER — STERILE WATER FOR INJECTION IJ SOLN
INTRAMUSCULAR | Status: AC
  Filled 2013-09-25: qty 10

## 2013-09-25 MED ORDER — LACTATED RINGERS IV SOLN
INTRAVENOUS | Status: AC | PRN
Start: 2013-09-25 — End: ?
  Administered 2013-09-25: 08:00:00 via INTRAVENOUS

## 2013-09-25 MED ORDER — LACTATED RINGERS IV SOLN
INTRAVENOUS | Status: AC | PRN
  Administered 2013-09-25 – 2014-05-05 (×3): via INTRAVENOUS

## 2013-09-25 MED ORDER — MIDAZOLAM HCL 5 MG/5ML IJ SOLN
INTRAMUSCULAR | Status: DC | PRN
Start: 2013-09-25 — End: 2013-10-30
  Administered 2013-09-25: 2 mg via INTRAVENOUS

## 2013-09-25 MED ORDER — PROPOFOL 10 MG/ML IV BOLUS
INTRAVENOUS | Status: AC
  Filled 2013-09-25: qty 20

## 2013-09-25 MED ORDER — MIDAZOLAM HCL 2 MG/2ML IJ SOLN
INTRAMUSCULAR | Status: AC
  Filled 2013-09-25: qty 2

## 2013-09-25 MED ORDER — FENTANYL CITRATE 0.05 MG/ML IJ SOLN
INTRAMUSCULAR | Status: DC | PRN
Start: 2013-09-25 — End: 2013-10-30
  Administered 2013-09-25: 50 ug via INTRAVENOUS

## 2013-09-25 MED ORDER — SODIUM CHLORIDE 0.9 % IV SOLN: Freq: Once | INTRAVENOUS | Status: DC

## 2013-09-25 SURGICAL SUPPLY — 46 items
ATTRACTOMAT 16X20 MAGNETIC DRP (DRAPES) ×3 IMPLANT
BAG ISL DRAPE 18X18 STRL (DRAPES) ×2
BAG ISOLATION DRAPE 18X18 (DRAPES) ×4 IMPLANT
CANISTER SUCTION 2500CC (MISCELLANEOUS) ×3 IMPLANT
CANNULA VESSEL 3MM 2 BLNT TIP (CANNULA) ×3 IMPLANT
CLIP TI MEDIUM 24 (CLIP) ×3 IMPLANT
CLIP TI WIDE RED SMALL 24 (CLIP) ×3 IMPLANT
COVER SURGICAL LIGHT HANDLE (MISCELLANEOUS) ×3 IMPLANT
DRAPE ISOLATION BAG 18X18 (DRAPES) ×2
DRAPE WARM FLUID 44X44 (DRAPE) ×3 IMPLANT
ELECT BLADE 6.5 EXT (BLADE) IMPLANT
ELECT REM PT RETURN 9FT ADLT (ELECTROSURGICAL) ×2
ELECTRODE REM PT RTRN 9FT ADLT (ELECTROSURGICAL) ×2 IMPLANT
GLOVE BIO SURGEON STRL SZ7.5 (GLOVE) ×3 IMPLANT
GOWN STRL REUS W/ TWL LRG LVL3 (GOWN DISPOSABLE) ×6 IMPLANT
GOWN STRL REUS W/TWL LRG LVL3 (GOWN DISPOSABLE) ×6
INSERT FOGARTY 61MM (MISCELLANEOUS) ×3 IMPLANT
INSERT FOGARTY SM (MISCELLANEOUS) ×6 IMPLANT
KIT BASIN OR (CUSTOM PROCEDURE TRAY) ×3 IMPLANT
KIT ROOM TURNOVER OR (KITS) ×3 IMPLANT
LOOP VESSEL MINI RED (MISCELLANEOUS) IMPLANT
NS IRRIG 1000ML POUR BTL (IV SOLUTION) ×6 IMPLANT
PACK AORTA (CUSTOM PROCEDURE TRAY) ×3 IMPLANT
PAD ARMBOARD 7.5X6 YLW CONV (MISCELLANEOUS) ×6 IMPLANT
RETAINER VISCERA MED (MISCELLANEOUS) ×3 IMPLANT
SPONGE LAP 18X18 X RAY DECT (DISPOSABLE) IMPLANT
SPONGE SURGIFOAM ABS GEL 100 (HEMOSTASIS) IMPLANT
STAPLER VISISTAT 35W (STAPLE) ×6 IMPLANT
SUT PDS AB 1 TP1 54 (SUTURE) ×6 IMPLANT
SUT PROLENE 3 0 SH DA (SUTURE) ×6 IMPLANT
SUT PROLENE 3 0 SH1 36 (SUTURE) ×6 IMPLANT
SUT PROLENE 5 0 C 1 24 (SUTURE) ×6 IMPLANT
SUT PROLENE 6 0 C 1 30 (SUTURE) ×3 IMPLANT
SUT PROLENE 6 0 CC (SUTURE) ×3 IMPLANT
SUT SILK 2 0 TIES 17X18 (SUTURE) ×2
SUT SILK 2 0SH CR/8 30 (SUTURE) ×3 IMPLANT
SUT SILK 2-0 18XBRD TIE BLK (SUTURE) ×2 IMPLANT
SUT SILK 3 0 TIES 17X18 (SUTURE) ×2
SUT SILK 3-0 18XBRD TIE BLK (SUTURE) ×2 IMPLANT
SUT VIC AB 2-0 CT1 36 (SUTURE) ×6 IMPLANT
SUT VIC AB 3-0 SH 27 (SUTURE) ×4
SUT VIC AB 3-0 SH 27X BRD (SUTURE) ×4 IMPLANT
TOWEL OR 17X24 6PK STRL BLUE (TOWEL DISPOSABLE) ×6 IMPLANT
TOWEL OR 17X26 10 PK STRL BLUE (TOWEL DISPOSABLE) ×6 IMPLANT
TRAY FOLEY CATH 16FRSI W/METER (SET/KITS/TRAYS/PACK) ×3 IMPLANT
WATER STERILE IRR 1000ML POUR (IV SOLUTION) ×6 IMPLANT

## 2013-09-25 NOTE — H&P (View-Only) (Signed)
[facility] [place] HISTORY AND PHYSICAL    History of Present Illness:  Patient is a 53 y.o. year old male who presents for evaluation of bilateral lower extremity claudication calf and hip with left foot rest pain. The patient states he has had off and on problems with his hips and thighs for 4 years on the right after hip replacement at least 2 years on the left after hip replacement. He has pain in his hips and thighs after walking less than half a block. He also develops claudication symptoms in the calf bilaterally. He is also develops rest pain in the left foot that has been going on for approximate 6 months. He is a smoker. He was counseled today greater than 3 minutes and smoking cessation. He denies any nonhealing wounds on the feet. Other medical problems include arthritis, reflux, schizophrenia/bipolar all of which are currently stable. He was recently started on Pletal by Dr. Cathlean Marseilles.    Past Medical History   Diagnosis  Date   .  Chronic back pain         and right hip   .  GERD (gastroesophageal reflux disease)         occ tums   .  Arthritis     .  Hip pain     .  Schizophrenia     .  Bipolar affective         Past Surgical History   Procedure  Laterality  Date   .  Joint replacement           rt hip replacement   .  Mandible fracture surgery       .  Esophagogastroduodenoscopy    09/06/2011       Procedure: ESOPHAGOGASTRODUODENOSCOPY (EGD);  Surgeon: Vertell Novak., MD;  Location: [facility];  Service: Endoscopy;  Laterality: N/A;   .  Savory dilation    09/06/2011       Procedure: SAVORY DILATION;  Surgeon: Vertell Novak., MD;  Location: [facility];  Service: Endoscopy;  Laterality: N/A;   .  Esophageal dilation       .  Total hip arthroplasty  Left  04/17/2012       Procedure: LEFT TOTAL HIP ARTHROPLASTY ANTERIOR APPROACH;  Surgeon: Kathryne Hitch, MD;  Location: [facility] OR;  Service: Orthopedics;  Laterality: Left;     Social  History History   Substance Use Topics   .  Smoking status:  Current Every Day Smoker -- 0.50 packs/day for 34 years       Types:  Cigarettes   .  Smokeless tobacco:  Not on file   .  Alcohol Use:  1.8 oz/week       3 Cans of beer per week         Comment: 3x a week      Family History No family history on file.  Allergies    Allergies   Allergen  Reactions   .  Ibuprofen         Gastric irritation   .  Tramadol         itching        Current Outpatient Prescriptions   Medication  Sig  Dispense  Refill   .  cilostazol (PLETAL) 100 MG tablet  Take 100 mg by mouth 2 (two) times daily.         .  methocarbamol (ROBAXIN) 500 MG tablet  Take 1 tablet (500  mg total) by mouth every 6 (six) hours as needed.   40 tablet   1   .  tiZANidine (ZANAFLEX) 4 MG capsule  Take 4 mg by mouth as needed for muscle spasms.         Marland Kitchen  aspirin EC 325 MG tablet  Take 1 tablet (325 mg total) by mouth 2 (two) times daily.   45 tablet   0   .  ferrous sulfate 325 (65 FE) MG tablet  Take 1 tablet (325 mg total) by mouth 3 (three) times daily after meals.   90 tablet   0   .  HYDROcodone-acetaminophen (NORCO/VICODIN) 5-325 MG per tablet  Take 1-2 tablets by mouth every 6 hours as needed for pain.   15 tablet   0   .  oxyCODONE-acetaminophen (ROXICET) 5-325 MG per tablet  Take 1-2 tablets by mouth every 4 (four) hours as needed for pain.   60 tablet   0      No current facility-administered medications for this visit.     ROS:    General:  No weight loss, Fever, chills  HEENT: No recent headaches, no nasal bleeding, no visual changes, no sore throat  Neurologic: No dizziness, blackouts, seizures. No recent symptoms of stroke or mini- stroke. No recent episodes of slurred speech, or temporary blindness.  Cardiac: No recent episodes of chest pain/pressure, no shortness of breath at rest.  No shortness of breath with exertion.  Denies history of atrial fibrillation or irregular heartbeat  Vascular: +  history of rest pain in feet.  + history of claudication.  No history of non-healing ulcer, No history of DVT    Pulmonary: No home oxygen, no productive cough, no hemoptysis,  No asthma or wheezing  Musculoskeletal:  [x ] Arthritis, [x ] Low back pain,  [x ] Joint pain  Hematologic:No history of hypercoagulable state.  No history of easy bleeding.  No history of anemia  Gastrointestinal: No hematochezia or melena,  + gastroesophageal reflux, no trouble swallowing  Urinary:  chronic Kidney disease,  on HD -  MWF or  TTHS,  Burning with urination,  Frequent urination,  Difficulty urinating;    Skin: No rashes  Psychological: No history of anxiety,  No history of depression   Physical Examination  Filed Vitals:   09/05/13 1504  BP: 114/89  Pulse: 83  Height: 5' 10.8" (1.798 m)  Weight: 139 lb 8 oz (63.277 kg)  SpO2: 100%   General:  Alert and oriented, no acute distress HEENT: Normal Neck: No bruit or JVD Pulmonary: Clear to auscultation bilaterally Cardiac: Regular Rate and Rhythm without murmur Abdomen: Soft, non-tender, non-distended, no mass, no scars, thin Skin: No rash Extremity Pulses:  2+ radial, brachial, absent femoral, dorsalis pedis, posterior tibial pulses bilaterally Musculoskeletal: No deformity or edema     Neurologic: Upper and lower extremity motor 5/5 and symmetric  DATA:  Noninvasive arterial study from [facility] hospital dated 06/12/2013 is reviewed today. ABI on the right 0.5 to left 0.43 monophasic waveforms from the common femoral distally.  A CT angiogram is reviewed today this shows an infrarenal aortic occlusion with occlusion of the common iliacs bilaterally, the right external and internal iliac artery reconstitutes. The left external iliac artery is occluded. 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 He  needs cardiac risk stratification prior to this. We will schedule his aortobifem after cardiac evaluation.  Fabienne Bruns, MD [facility] [place] Office: [phone] Pager: [phone]

## 2013-09-25 NOTE — Interval H&P Note (Signed)
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 After consideration of risks, benefits and other options for treatment, the patient has consented to  Procedure(s): AORTA BIFEMORAL BYPASS GRAFT (N/A) as a surgical intervention .  The patient's history has been reviewed, patient examined, no change in status, stable for surgery.  I have reviewed the patient's chart and labs.  Questions were answered to the patient's satisfaction.     [name] E

## 2013-09-25 NOTE — Anesthesia Preprocedure Evaluation (Addendum)
Anesthesia Evaluation  Patient identified by MRN, date of birth, ID band Patient awake    Reviewed: Allergy & Precautions, H&P , NPO status , Patient's Chart, lab work & pertinent test results, reviewed documented beta blocker date and time   Airway Mallampati: III TM Distance: >3 FB Neck ROM: Full    Dental  (+) Missing, Chipped, Dental Advisory Given, Poor Dentition,    Pulmonary Current Smoker,          Cardiovascular hypertension, Pt. on home beta blockers     Neuro/Psych  Headaches, PSYCHIATRIC DISORDERS Anxiety Depression Bipolar Disorder Schizophrenia    GI/Hepatic GERD-  ,  Endo/Other    Renal/GU      Musculoskeletal  (+) Arthritis -,   Abdominal   Peds  Hematology   Anesthesia Other Findings   Reproductive/Obstetrics                          Anesthesia Physical Anesthesia Plan  ASA: III  Anesthesia Plan: General   Post-op Pain Management:    Induction: Intravenous  Airway Management Planned: Oral ETT  Additional Equipment: Arterial line, CVP, PA Cath and Ultrasound Guidance Line Placement  Intra-op Plan:   Post-operative Plan: Possible Post-op intubation/ventilation  Informed Consent: I have reviewed the patients History and Physical, chart, labs and discussed the procedure including the risks, benefits and alternatives for the proposed anesthesia with the patient or authorized representative who has indicated his/her understanding and acceptance.   Dental advisory given  Plan Discussed with: CRNA, Anesthesiologist and Surgeon  Anesthesia Plan Comments:         Anesthesia Quick Evaluation

## 2013-09-25 NOTE — Progress Notes (Signed)
Pt operation cancelled today due to positive drug test for cocaine.  Have discussed with pt risk of anesthesia and operation with cocaine present.  This is an elective operation.  Pt states he self medicates with cocaine due to his claudication symptoms. We have rescheduled his operation for Sept 28 and he will need to have a drug test again that morning.  I discussed with pt that claudication is not usually treated with narcotic pain meds and that walking is the best option to improve his walking distance.  He states he is in drug treatment program and I have encouraged him to continue this.  Fabienne Bruns, MD [facility] [place] Office: [phone] Pager: [phone]

## 2013-09-26 ENCOUNTER — Telehealth: Payer: Self-pay | Admitting: *Deleted

## 2013-09-26 ENCOUNTER — Other Ambulatory Visit: Payer: Self-pay

## 2013-09-26 LAB — TYPE AND SCREEN
ABO/RH(D): O POS
Antibody Screen: NEGATIVE
UNIT DIVISION: 0
Unit division: 0

## 2013-09-26 NOTE — Telephone Encounter (Signed)
Dr. Darrick Penna said to schedule the patient for surgery next week for his aortobifemoral BPG. We have tried to reach the patient at his phone number in [employer] numerous times but have been unsuccessful. I received a call from patient's PO officer, Mr. Allene Dillon [phone]) and he does not have any other phone information. He said patient's next probation appt is on 10-11-13 but he will try to reach him and have him call us for surgery info. Mr. Allene Dillon checked and the patient has not been revoked and is not currently in the jail system.

## 2013-09-27 ENCOUNTER — Other Ambulatory Visit: Payer: Self-pay

## 2013-10-04 ENCOUNTER — Encounter (HOSPITAL_COMMUNITY): Payer: Self-pay | Admitting: *Deleted

## 2013-10-04 MED ORDER — HYDROMORPHONE HCL 1 MG/ML IJ SOLN: 0.2500 mg | INTRAMUSCULAR | Status: DC | PRN

## 2013-10-04 MED ORDER — PROMETHAZINE HCL 25 MG/ML IJ SOLN: 6.2500 mg | INTRAMUSCULAR | Status: DC | PRN

## 2013-10-04 MED ORDER — OXYCODONE HCL 5 MG/5ML PO SOLN: 5.0000 mg | Freq: Once | ORAL | Status: DC | PRN

## 2013-10-04 MED ORDER — OXYCODONE HCL 5 MG PO TABS: 5.0000 mg | ORAL_TABLET | Freq: Once | ORAL | Status: DC | PRN

## 2013-10-04 NOTE — Progress Notes (Signed)
I spoke with patient, instructed him to take Coreg and pain medication if need the day of surgery.  James Stevens states that he has not uses any cocaine in a week. Instructed of arrival time of 0530.

## 2013-10-07 ENCOUNTER — Inpatient Hospital Stay (HOSPITAL_COMMUNITY): Payer: Medicaid Other | Admitting: Certified Registered Nurse Anesthetist

## 2013-10-07 ENCOUNTER — Encounter (HOSPITAL_COMMUNITY): Payer: Medicaid Other | Admitting: Certified Registered Nurse Anesthetist

## 2013-10-07 ENCOUNTER — Encounter (HOSPITAL_COMMUNITY): Payer: Self-pay | Admitting: *Deleted

## 2013-10-07 ENCOUNTER — Inpatient Hospital Stay (HOSPITAL_COMMUNITY): Payer: Medicaid Other

## 2013-10-07 ENCOUNTER — Encounter (HOSPITAL_COMMUNITY)
Admission: RE | Disposition: A | Discharge status: Home / Self Care | Payer: Medicaid Other | Source: Ambulatory Visit | Attending: Vascular Surgery

## 2013-10-07 ENCOUNTER — Inpatient Hospital Stay (HOSPITAL_COMMUNITY)
Admission: RE | Admit: 2013-10-07 | Discharge: 2013-10-12 | DRG: 269 | Disposition: A | Discharge status: Home / Self Care | Payer: Medicaid Other | Source: Ambulatory Visit | Attending: Vascular Surgery | Admitting: Vascular Surgery

## 2013-10-07 DIAGNOSIS — F209 Schizophrenia, unspecified: Secondary | ICD-10-CM | POA: Diagnosis present

## 2013-10-07 DIAGNOSIS — K913 Postprocedural intestinal obstruction: Secondary | ICD-10-CM | POA: Diagnosis not present

## 2013-10-07 DIAGNOSIS — I745 Embolism and thrombosis of iliac artery: Secondary | ICD-10-CM | POA: Diagnosis present

## 2013-10-07 DIAGNOSIS — M1990 Unspecified osteoarthritis, unspecified site: Secondary | ICD-10-CM | POA: Diagnosis present

## 2013-10-07 DIAGNOSIS — Z96643 Presence of artificial hip joint, bilateral: Secondary | ICD-10-CM | POA: Diagnosis present

## 2013-10-07 DIAGNOSIS — F17210 Nicotine dependence, cigarettes, uncomplicated: Secondary | ICD-10-CM | POA: Diagnosis present

## 2013-10-07 DIAGNOSIS — F3189 Other bipolar disorder: Secondary | ICD-10-CM | POA: Diagnosis present

## 2013-10-07 DIAGNOSIS — K219 Gastro-esophageal reflux disease without esophagitis: Secondary | ICD-10-CM | POA: Diagnosis present

## 2013-10-07 DIAGNOSIS — M25552 Pain in left hip: Secondary | ICD-10-CM | POA: Diagnosis not present

## 2013-10-07 DIAGNOSIS — M25551 Pain in right hip: Secondary | ICD-10-CM | POA: Diagnosis not present

## 2013-10-07 DIAGNOSIS — I70213 Atherosclerosis of native arteries of extremities with intermittent claudication, bilateral legs: Secondary | ICD-10-CM | POA: Diagnosis present

## 2013-10-07 DIAGNOSIS — I7409 Other arterial embolism and thrombosis of abdominal aorta: Secondary | ICD-10-CM | POA: Diagnosis not present

## 2013-10-07 DIAGNOSIS — Y832 Surgical operation with anastomosis, bypass or graft as the cause of abnormal reaction of the patient, or of later complication, without mention of misadventure at the time of the procedure: Secondary | ICD-10-CM | POA: Diagnosis not present

## 2013-10-07 DIAGNOSIS — I70219 Atherosclerosis of native arteries of extremities with intermittent claudication, unspecified extremity: Secondary | ICD-10-CM

## 2013-10-07 DIAGNOSIS — I7092 Chronic total occlusion of artery of the extremities: Secondary | ICD-10-CM | POA: Diagnosis not present

## 2013-10-07 HISTORY — PX: AORTA - BILATERAL FEMORAL ARTERY BYPASS GRAFT: SHX1175

## 2013-10-07 LAB — BLOOD GAS, ARTERIAL
ACID-BASE EXCESS: 2.1 mmol/L — AB (ref 0.0–2.0)
Acid-base deficit: 0.5 mmol/L (ref 0.0–2.0)
BICARBONATE: 25 meq/L — AB (ref 20.0–24.0)
Bicarbonate: 27 mEq/L — ABNORMAL HIGH (ref 20.0–24.0)
Drawn by: 28091
O2 Content: 3 L/min
O2 Saturation: 99.3 %
O2 Saturation: 87.3 %
PATIENT TEMPERATURE: 98.6
PCO2 ART: 49.1 mmHg — AB (ref 35.0–45.0)
PH ART: 7.36 (ref 7.350–7.450)
PH ART: 7.313 — AB (ref 7.350–7.450)
PO2 ART: 161 mmHg — AB (ref 80.0–100.0)
Patient temperature: 98.6
TCO2: 28.5 mmol/L (ref 0–100)
TCO2: 26.5 mmol/L (ref 0–100)
pCO2 arterial: 50.7 mmHg — ABNORMAL HIGH (ref 35.0–45.0)
pO2, Arterial: 60.4 mmHg — ABNORMAL LOW (ref 80.0–100.0)

## 2013-10-07 LAB — PROTIME-INR
INR: 1 (ref 0.00–1.49)
INR: 1.13 (ref 0.00–1.49)
Prothrombin Time: 13.2 seconds (ref 11.6–15.2)
Prothrombin Time: 14.5 seconds (ref 11.6–15.2)

## 2013-10-07 LAB — URINALYSIS, ROUTINE W REFLEX MICROSCOPIC
BILIRUBIN URINE: NEGATIVE
Glucose, UA: NEGATIVE mg/dL
HGB URINE DIPSTICK: NEGATIVE
Ketones, ur: NEGATIVE mg/dL
Leukocytes, UA: NEGATIVE
Nitrite: NEGATIVE
PROTEIN: NEGATIVE mg/dL
Specific Gravity, Urine: 1.015 (ref 1.005–1.030)
UROBILINOGEN UA: 0.2 mg/dL (ref 0.0–1.0)
pH: 6 (ref 5.0–8.0)

## 2013-10-07 LAB — CBC
HCT: 40.6 % (ref 39.0–52.0)
HCT: 38.9 % — ABNORMAL LOW (ref 39.0–52.0)
Hemoglobin: 14 g/dL (ref 13.0–17.0)
Hemoglobin: 13.3 g/dL (ref 13.0–17.0)
MCH: 32.1 pg (ref 26.0–34.0)
MCH: 31.9 pg (ref 26.0–34.0)
MCHC: 34.5 g/dL (ref 30.0–36.0)
MCHC: 34.2 g/dL (ref 30.0–36.0)
MCV: 93.1 fL (ref 78.0–100.0)
MCV: 93.3 fL (ref 78.0–100.0)
PLATELETS: 204 10*3/uL (ref 150–400)
Platelets: 164 10*3/uL (ref 150–400)
RBC: 4.36 MIL/uL (ref 4.22–5.81)
RBC: 4.17 MIL/uL — AB (ref 4.22–5.81)
RDW: 13.7 % (ref 11.5–15.5)
RDW: 13.8 % (ref 11.5–15.5)
WBC: 7.4 10*3/uL (ref 4.0–10.5)
WBC: 18.3 10*3/uL — AB (ref 4.0–10.5)

## 2013-10-07 LAB — COMPREHENSIVE METABOLIC PANEL
ALBUMIN: 3.3 g/dL — AB (ref 3.5–5.2)
ALT: 72 U/L — ABNORMAL HIGH (ref 0–53)
AST: 33 U/L (ref 0–37)
Alkaline Phosphatase: 66 U/L (ref 39–117)
Anion gap: 10 (ref 5–15)
BUN: 7 mg/dL (ref 6–23)
CHLORIDE: 105 meq/L (ref 96–112)
CO2: 26 mEq/L (ref 19–32)
Calcium: 9.1 mg/dL (ref 8.4–10.5)
Creatinine, Ser: 1.12 mg/dL (ref 0.50–1.35)
GFR calc Af Amer: 85 mL/min — ABNORMAL LOW (ref >90)
GFR calc non Af Amer: 73 mL/min — ABNORMAL LOW (ref >90)
Glucose, Bld: 93 mg/dL (ref 70–99)
Potassium: 4.4 mEq/L (ref 3.7–5.3)
Sodium: 141 mEq/L (ref 137–147)
Total Bilirubin: 0.3 mg/dL (ref 0.3–1.2)
Total Protein: 6.4 g/dL (ref 6.0–8.3)

## 2013-10-07 LAB — BASIC METABOLIC PANEL
Anion gap: 9 (ref 5–15)
BUN: 6 mg/dL (ref 6–23)
CHLORIDE: 105 meq/L (ref 96–112)
CO2: 26 meq/L (ref 19–32)
CREATININE: 0.93 mg/dL (ref 0.50–1.35)
Calcium: 8.6 mg/dL (ref 8.4–10.5)
GFR calc Af Amer: >90 mL/min (ref >90)
GFR calc non Af Amer: >90 mL/min (ref >90)
GLUCOSE: 189 mg/dL — AB (ref 70–99)
Potassium: 4.2 mEq/L (ref 3.7–5.3)
Sodium: 140 mEq/L (ref 137–147)

## 2013-10-07 LAB — RAPID URINE DRUG SCREEN, HOSP PERFORMED
Amphetamines: NOT DETECTED
BARBITURATES: NOT DETECTED
Benzodiazepines: NOT DETECTED
COCAINE: NOT DETECTED
Opiates: POSITIVE — AB
Tetrahydrocannabinol: NOT DETECTED

## 2013-10-07 LAB — APTT
APTT: 88 s — AB (ref 24–37)
aPTT: 31 seconds (ref 24–37)

## 2013-10-07 LAB — MAGNESIUM: Magnesium: 1.5 mg/dL (ref 1.5–2.5)

## 2013-10-07 LAB — PREPARE RBC (CROSSMATCH)

## 2013-10-07 SURGERY — CREATION, BYPASS, ARTERIAL, AORTA TO FEMORAL, BILATERAL, USING GRAFT
Anesthesia: General | Site: Abdomen

## 2013-10-07 MED ORDER — HYDRALAZINE HCL 20 MG/ML IJ SOLN
10.0000 mg | INTRAMUSCULAR | Status: DC | PRN
Start: 2013-10-07 — End: 2013-10-12
  Administered 2013-10-07: 10 mg via INTRAVENOUS

## 2013-10-07 MED ORDER — ALBUMIN HUMAN 5 % IV SOLN
INTRAVENOUS | Status: DC | PRN
  Administered 2013-10-07 (×2): via INTRAVENOUS

## 2013-10-07 MED ORDER — ROCURONIUM BROMIDE 100 MG/10ML IV SOLN
INTRAVENOUS | Status: DC | PRN
  Administered 2013-10-07: 50 mg via INTRAVENOUS

## 2013-10-07 MED ORDER — SUCCINYLCHOLINE CHLORIDE 20 MG/ML IJ SOLN
INTRAMUSCULAR | Status: AC
  Filled 2013-10-07: qty 1

## 2013-10-07 MED ORDER — GLYCOPYRROLATE 0.2 MG/ML IJ SOLN
INTRAMUSCULAR | Status: AC
  Filled 2013-10-07: qty 3

## 2013-10-07 MED ORDER — PROPOFOL 10 MG/ML IV BOLUS
INTRAVENOUS | Status: AC
  Filled 2013-10-07: qty 20

## 2013-10-07 MED ORDER — SODIUM CHLORIDE 0.9 % IJ SOLN
INTRAMUSCULAR | Status: AC
  Filled 2013-10-07: qty 10

## 2013-10-07 MED ORDER — FENTANYL CITRATE 0.05 MG/ML IJ SOLN
INTRAMUSCULAR | Status: AC
  Filled 2013-10-07: qty 5

## 2013-10-07 MED ORDER — PROTAMINE SULFATE 10 MG/ML IV SOLN
INTRAVENOUS | Status: DC | PRN
  Administered 2013-10-07: 70 mg via INTRAVENOUS

## 2013-10-07 MED ORDER — PROPOFOL 10 MG/ML IV BOLUS
INTRAVENOUS | Status: DC | PRN
  Administered 2013-10-07: 150 mg via INTRAVENOUS

## 2013-10-07 MED ORDER — GLYCOPYRROLATE 0.2 MG/ML IJ SOLN
INTRAMUSCULAR | Status: DC | PRN
  Administered 2013-10-07: 0.6 mg via INTRAVENOUS

## 2013-10-07 MED ORDER — PROTAMINE SULFATE 10 MG/ML IV SOLN
INTRAVENOUS | Status: AC
  Filled 2013-10-07: qty 10

## 2013-10-07 MED ORDER — METOPROLOL TARTRATE 1 MG/ML IV SOLN
INTRAVENOUS | Status: AC
  Filled 2013-10-07: qty 5

## 2013-10-07 MED ORDER — FENTANYL CITRATE 0.05 MG/ML IJ SOLN
INTRAMUSCULAR | Status: DC | PRN
  Administered 2013-10-07 (×2): 50 ug via INTRAVENOUS
  Administered 2013-10-07: 100 ug via INTRAVENOUS
  Administered 2013-10-07: 200 ug via INTRAVENOUS
  Administered 2013-10-07 (×4): 50 ug via INTRAVENOUS
  Administered 2013-10-07: 100 ug via INTRAVENOUS
  Administered 2013-10-07 (×4): 50 ug via INTRAVENOUS

## 2013-10-07 MED ORDER — ALUM & MAG HYDROXIDE-SIMETH 200-200-20 MG/5ML PO SUSP: 15.0000 mL | ORAL | Status: DC | PRN

## 2013-10-07 MED ORDER — LIDOCAINE HCL (CARDIAC) 20 MG/ML IV SOLN
INTRAVENOUS | Status: AC
  Filled 2013-10-07: qty 5

## 2013-10-07 MED ORDER — LACTATED RINGERS IV SOLN
INTRAVENOUS | Status: DC | PRN
  Administered 2013-10-07 (×5): via INTRAVENOUS

## 2013-10-07 MED ORDER — SODIUM CHLORIDE 0.9 % IV SOLN: 500.0000 mL | Freq: Once | INTRAVENOUS | Status: AC | PRN

## 2013-10-07 MED ORDER — GUAIFENESIN-DM 100-10 MG/5ML PO SYRP
15.0000 mL | ORAL_SOLUTION | ORAL | Status: DC | PRN
Start: 2013-10-07 — End: 2013-10-12

## 2013-10-07 MED ORDER — FENTANYL CITRATE 0.05 MG/ML IJ SOLN
25.0000 ug | INTRAMUSCULAR | Status: DC | PRN
  Administered 2013-10-07 (×3): 50 ug via INTRAVENOUS

## 2013-10-07 MED ORDER — PANTOPRAZOLE SODIUM 40 MG PO TBEC
40.0000 mg | DELAYED_RELEASE_TABLET | Freq: Every day | ORAL | Status: DC
  Administered 2013-10-09 – 2013-10-12 (×4): 40 mg via ORAL
  Filled 2013-10-07 (×4): qty 1

## 2013-10-07 MED ORDER — NITROGLYCERIN IN D5W 200-5 MCG/ML-% IV SOLN
INTRAVENOUS | Status: DC | PRN
  Administered 2013-10-07: 5 ug/min via INTRAVENOUS

## 2013-10-07 MED ORDER — METOPROLOL TARTRATE 1 MG/ML IV SOLN
2.0000 mg | INTRAVENOUS | Status: DC | PRN
  Administered 2013-10-07: 3 mg via INTRAVENOUS

## 2013-10-07 MED ORDER — VECURONIUM BROMIDE 10 MG IV SOLR
INTRAVENOUS | Status: AC
  Filled 2013-10-07: qty 10

## 2013-10-07 MED ORDER — NEOSTIGMINE METHYLSULFATE 10 MG/10ML IV SOLN
INTRAVENOUS | Status: AC
  Filled 2013-10-07: qty 1

## 2013-10-07 MED ORDER — ARTIFICIAL TEARS OP OINT
TOPICAL_OINTMENT | OPHTHALMIC | Status: AC
  Filled 2013-10-07: qty 3.5

## 2013-10-07 MED ORDER — LIDOCAINE HCL (CARDIAC) 20 MG/ML IV SOLN
INTRAVENOUS | Status: DC | PRN
  Administered 2013-10-07: 80 mg via INTRAVENOUS

## 2013-10-07 MED ORDER — PHENYLEPHRINE HCL 10 MG/ML IJ SOLN
10.0000 mg | INTRAVENOUS | Status: DC | PRN
  Administered 2013-10-07: 10 ug/min via INTRAVENOUS

## 2013-10-07 MED ORDER — HEPARIN SODIUM (PORCINE) 1000 UNIT/ML IJ SOLN
INTRAMUSCULAR | Status: AC
  Filled 2013-10-07: qty 1

## 2013-10-07 MED ORDER — THROMBIN 20000 UNITS EX SOLR
CUTANEOUS | Status: AC
  Filled 2013-10-07: qty 20000

## 2013-10-07 MED ORDER — DEXTROSE 5 % IV SOLN
1.5000 g | Freq: Two times a day (BID) | INTRAVENOUS | Status: AC
  Administered 2013-10-08 (×2): 1.5 g via INTRAVENOUS
  Filled 2013-10-07 (×2): qty 1.5

## 2013-10-07 MED ORDER — FENTANYL CITRATE 0.05 MG/ML IJ SOLN
INTRAMUSCULAR | Status: AC
  Filled 2013-10-07: qty 2

## 2013-10-07 MED ORDER — EPHEDRINE SULFATE 50 MG/ML IJ SOLN
INTRAMUSCULAR | Status: AC
  Filled 2013-10-07: qty 1

## 2013-10-07 MED ORDER — ACETAMINOPHEN 325 MG PO TABS: 325.0000 mg | ORAL_TABLET | ORAL | Status: DC | PRN

## 2013-10-07 MED ORDER — HEPARIN SODIUM (PORCINE) 1000 UNIT/ML IJ SOLN
INTRAMUSCULAR | Status: DC | PRN
  Administered 2013-10-07: 7000 [IU] via INTRAVENOUS

## 2013-10-07 MED ORDER — PHENOL 1.4 % MT LIQD: 1.0000 | OROMUCOSAL | Status: DC | PRN

## 2013-10-07 MED ORDER — PANTOPRAZOLE SODIUM 40 MG IV SOLR
40.0000 mg | Freq: Every day | INTRAVENOUS | Status: DC
  Administered 2013-10-07 – 2013-10-10 (×4): 40 mg via INTRAVENOUS
  Filled 2013-10-07 (×5): qty 40

## 2013-10-07 MED ORDER — HYDRALAZINE HCL 20 MG/ML IJ SOLN
INTRAMUSCULAR | Status: AC
  Filled 2013-10-07: qty 1

## 2013-10-07 MED ORDER — MIDAZOLAM HCL 5 MG/5ML IJ SOLN
INTRAMUSCULAR | Status: DC | PRN
  Administered 2013-10-07: 2 mg via INTRAVENOUS

## 2013-10-07 MED ORDER — VECURONIUM BROMIDE 10 MG IV SOLR
INTRAVENOUS | Status: DC | PRN
  Administered 2013-10-07: 2 mg via INTRAVENOUS
  Administered 2013-10-07: 1 mg via INTRAVENOUS
  Administered 2013-10-07 (×2): 2 mg via INTRAVENOUS

## 2013-10-07 MED ORDER — MORPHINE SULFATE 2 MG/ML IJ SOLN
2.0000 mg | INTRAMUSCULAR | Status: DC | PRN
  Administered 2013-10-07 – 2013-10-09 (×7): 4 mg via INTRAVENOUS
  Filled 2013-10-07 (×8): qty 2

## 2013-10-07 MED ORDER — KCL IN DEXTROSE-NACL 20-5-0.45 MEQ/L-%-% IV SOLN
INTRAVENOUS | Status: AC
  Filled 2013-10-07: qty 1000

## 2013-10-07 MED ORDER — POTASSIUM CHLORIDE CRYS ER 20 MEQ PO TBCR: 20.0000 meq | EXTENDED_RELEASE_TABLET | Freq: Once | ORAL | Status: AC | PRN

## 2013-10-07 MED ORDER — MAGNESIUM SULFATE 40 MG/ML IJ SOLN: 2.0000 g | Freq: Once | INTRAMUSCULAR | Status: AC | PRN

## 2013-10-07 MED ORDER — ACETAMINOPHEN 650 MG RE SUPP: 325.0000 mg | RECTAL | Status: DC | PRN

## 2013-10-07 MED ORDER — NEOSTIGMINE METHYLSULFATE 10 MG/10ML IV SOLN
INTRAVENOUS | Status: DC | PRN
  Administered 2013-10-07: 4 mg via INTRAVENOUS

## 2013-10-07 MED ORDER — DOCUSATE SODIUM 100 MG PO CAPS
100.0000 mg | ORAL_CAPSULE | Freq: Every day | ORAL | Status: DC
  Administered 2013-10-09 – 2013-10-12 (×4): 100 mg via ORAL
  Filled 2013-10-07 (×5): qty 1

## 2013-10-07 MED ORDER — ARTIFICIAL TEARS OP OINT
TOPICAL_OINTMENT | OPHTHALMIC | Status: DC | PRN
  Administered 2013-10-07: 1 via OPHTHALMIC

## 2013-10-07 MED ORDER — SODIUM CHLORIDE 0.9 % IV SOLN
INTRAVENOUS | Status: DC
  Administered 2013-10-07: 13:00:00 via INTRAVENOUS

## 2013-10-07 MED ORDER — HYDRALAZINE HCL 20 MG/ML IJ SOLN
INTRAMUSCULAR | Status: DC | PRN
  Administered 2013-10-07 (×4): 5 mg via INTRAVENOUS

## 2013-10-07 MED ORDER — DEXTROSE 5 % IV SOLN
1.5000 g | INTRAVENOUS | Status: DC | PRN
  Administered 2013-10-07 (×2): 1.5 g via INTRAVENOUS

## 2013-10-07 MED ORDER — FENTANYL CITRATE 0.05 MG/ML IJ SOLN
INTRAMUSCULAR | Status: AC
  Administered 2013-10-07: 50 ug via INTRAVENOUS
  Filled 2013-10-07: qty 2

## 2013-10-07 MED ORDER — 0.9 % SODIUM CHLORIDE (POUR BTL) OPTIME
TOPICAL | Status: DC | PRN
  Administered 2013-10-07: 1000 mL

## 2013-10-07 MED ORDER — MIDAZOLAM HCL 2 MG/2ML IJ SOLN
INTRAMUSCULAR | Status: AC
  Filled 2013-10-07: qty 2

## 2013-10-07 MED ORDER — METOPROLOL TARTRATE 1 MG/ML IV SOLN
2.5000 mg | Freq: Four times a day (QID) | INTRAVENOUS | Status: DC
  Administered 2013-10-07 – 2013-10-12 (×14): 2.5 mg via INTRAVENOUS
  Filled 2013-10-07 (×22): qty 5

## 2013-10-07 MED ORDER — TRAZODONE HCL 100 MG PO TABS
100.0000 mg | ORAL_TABLET | Freq: Every day | ORAL | Status: DC
  Administered 2013-10-07 – 2013-10-11 (×3): 100 mg via ORAL
  Filled 2013-10-07 (×6): qty 1

## 2013-10-07 MED ORDER — ROCURONIUM BROMIDE 50 MG/5ML IV SOLN
INTRAVENOUS | Status: AC
  Filled 2013-10-07: qty 1

## 2013-10-07 MED ORDER — KCL IN DEXTROSE-NACL 20-5-0.45 MEQ/L-%-% IV SOLN
INTRAVENOUS | Status: DC
  Administered 2013-10-07: 75 mL/h via INTRAVENOUS
  Administered 2013-10-08: 100 mL/h via INTRAVENOUS
  Administered 2013-10-08: 07:00:00 via INTRAVENOUS
  Administered 2013-10-09: 100 mL/h via INTRAVENOUS
  Filled 2013-10-07 (×9): qty 1000

## 2013-10-07 MED ORDER — ONDANSETRON HCL 4 MG/2ML IJ SOLN
INTRAMUSCULAR | Status: AC
  Filled 2013-10-07: qty 2

## 2013-10-07 MED ORDER — DEXTROSE 5 % IV SOLN
INTRAVENOUS | Status: AC
  Filled 2013-10-07: qty 1.5

## 2013-10-07 MED ORDER — CEFAZOLIN SODIUM-DEXTROSE 2-3 GM-% IV SOLR
INTRAVENOUS | Status: AC
  Filled 2013-10-07: qty 50

## 2013-10-07 MED ORDER — ONDANSETRON HCL 4 MG/2ML IJ SOLN
4.0000 mg | Freq: Four times a day (QID) | INTRAMUSCULAR | Status: DC | PRN
  Administered 2013-10-08: 4 mg via INTRAVENOUS
  Filled 2013-10-07: qty 2

## 2013-10-07 MED ORDER — SODIUM CHLORIDE 0.9 % IR SOLN
Status: DC | PRN
  Administered 2013-10-07: 10:00:00

## 2013-10-07 MED ORDER — DEXTROSE 5 % IV SOLN
1.5000 g | INTRAVENOUS | Status: DC
  Filled 2013-10-07: qty 1.5

## 2013-10-07 MED ORDER — ONDANSETRON HCL 4 MG/2ML IJ SOLN
INTRAMUSCULAR | Status: DC | PRN
  Administered 2013-10-07: 4 mg via INTRAVENOUS

## 2013-10-07 SURGICAL SUPPLY — 68 items
ADH SKN CLS APL DERMABOND .7 (GAUZE/BANDAGES/DRESSINGS) ×2
ATTRACTOMAT 16X20 MAGNETIC DRP (DRAPES) ×2 IMPLANT
BAG ISL DRAPE 18X18 STRL (DRAPES) ×2
BAG ISOLATION DRAPE 18X18 (DRAPES) ×2 IMPLANT
BLADE SURG 10 STRL SS (BLADE) ×1 IMPLANT
CANISTER SUCTION 2500CC (MISCELLANEOUS) ×2 IMPLANT
CANNULA VESSEL 3MM 2 BLNT TIP (CANNULA) ×4 IMPLANT
CLIP TI MEDIUM 24 (CLIP) ×2 IMPLANT
CLIP TI WIDE RED SMALL 24 (CLIP) ×2 IMPLANT
COVER SURGICAL LIGHT HANDLE (MISCELLANEOUS) ×2 IMPLANT
DERMABOND ADVANCED (GAUZE/BANDAGES/DRESSINGS) ×2
DERMABOND ADVANCED .7 DNX12 (GAUZE/BANDAGES/DRESSINGS) IMPLANT
DRAPE ISOLATION BAG 18X18 (DRAPES) ×2
DRAPE WARM FLUID 44X44 (DRAPE) ×2 IMPLANT
DRSG COVADERM 4X14 (GAUZE/BANDAGES/DRESSINGS) ×1 IMPLANT
DRSG COVADERM 4X6 (GAUZE/BANDAGES/DRESSINGS) ×2 IMPLANT
ELECT BLADE 6.5 EXT (BLADE) ×1 IMPLANT
ELECT REM PT RETURN 9FT ADLT (ELECTROSURGICAL) ×2
ELECTRODE REM PT RTRN 9FT ADLT (ELECTROSURGICAL) ×1 IMPLANT
GLOVE BIO SURGEON STRL SZ 6.5 (GLOVE) ×2 IMPLANT
GLOVE BIO SURGEON STRL SZ7.5 (GLOVE) ×3 IMPLANT
GLOVE BIOGEL M 6.5 STRL (GLOVE) ×2 IMPLANT
GLOVE BIOGEL M STRL SZ7.5 (GLOVE) ×1 IMPLANT
GLOVE BIOGEL PI IND STRL 6 (GLOVE) IMPLANT
GLOVE BIOGEL PI IND STRL 6.5 (GLOVE) IMPLANT
GLOVE BIOGEL PI IND STRL 7.0 (GLOVE) IMPLANT
GLOVE BIOGEL PI IND STRL 8 (GLOVE) IMPLANT
GLOVE BIOGEL PI INDICATOR 6 (GLOVE) ×3
GLOVE BIOGEL PI INDICATOR 6.5 (GLOVE) ×4
GLOVE BIOGEL PI INDICATOR 7.0 (GLOVE) ×3
GLOVE BIOGEL PI INDICATOR 8 (GLOVE) ×1
GOWN STRL REUS W/ TWL LRG LVL3 (GOWN DISPOSABLE) ×3 IMPLANT
GOWN STRL REUS W/TWL LRG LVL3 (GOWN DISPOSABLE) ×11 IMPLANT
GRAFT HEMASHIELD 16X8MM (Vascular Products) ×1 IMPLANT
INSERT FOGARTY 61MM (MISCELLANEOUS) ×2 IMPLANT
INSERT FOGARTY SM (MISCELLANEOUS) ×4 IMPLANT
KIT BASIN OR (CUSTOM PROCEDURE TRAY) ×2 IMPLANT
KIT ROOM TURNOVER OR (KITS) ×2 IMPLANT
LOOP VESSEL MINI RED (MISCELLANEOUS) ×3 IMPLANT
NS IRRIG 1000ML POUR BTL (IV SOLUTION) ×4 IMPLANT
PACK AORTA (CUSTOM PROCEDURE TRAY) ×2 IMPLANT
PAD ARMBOARD 7.5X6 YLW CONV (MISCELLANEOUS) ×4 IMPLANT
PROBE PENCIL 8 MHZ STRL DISP (MISCELLANEOUS) ×1 IMPLANT
RETAINER VISCERA MED (MISCELLANEOUS) ×2 IMPLANT
SPONGE LAP 18X18 X RAY DECT (DISPOSABLE) IMPLANT
SPONGE SURGIFOAM ABS GEL 100 (HEMOSTASIS) IMPLANT
STAPLER VISISTAT 35W (STAPLE) ×3 IMPLANT
SUT PDS AB 1 TP1 54 (SUTURE) ×4 IMPLANT
SUT PROLENE 3 0 SH DA (SUTURE) ×4 IMPLANT
SUT PROLENE 3 0 SH1 36 (SUTURE) ×5 IMPLANT
SUT PROLENE 5 0 C 1 24 (SUTURE) ×4 IMPLANT
SUT PROLENE 6 0 C 1 30 (SUTURE) ×1 IMPLANT
SUT PROLENE 6 0 CC (SUTURE) ×3 IMPLANT
SUT SILK 2 0 TIES 17X18 (SUTURE) ×2
SUT SILK 2 0SH CR/8 30 (SUTURE) ×2 IMPLANT
SUT SILK 2-0 18XBRD TIE BLK (SUTURE) ×1 IMPLANT
SUT SILK 3 0 (SUTURE) ×2
SUT SILK 3 0 TIES 17X18 (SUTURE) ×2
SUT SILK 3-0 18XBRD TIE 12 (SUTURE) IMPLANT
SUT SILK 3-0 18XBRD TIE BLK (SUTURE) ×1 IMPLANT
SUT VIC AB 2-0 CT1 36 (SUTURE) ×4 IMPLANT
SUT VIC AB 3-0 SH 27 (SUTURE) ×10
SUT VIC AB 3-0 SH 27X BRD (SUTURE) ×2 IMPLANT
SUT VICRYL 4-0 PS2 18IN ABS (SUTURE) ×2 IMPLANT
TOWEL OR 17X24 6PK STRL BLUE (TOWEL DISPOSABLE) ×2 IMPLANT
TOWEL OR 17X26 10 PK STRL BLUE (TOWEL DISPOSABLE) ×3 IMPLANT
TRAY FOLEY CATH 16FRSI W/METER (SET/KITS/TRAYS/PACK) ×2 IMPLANT
WATER STERILE IRR 1000ML POUR (IV SOLUTION) ×3 IMPLANT

## 2013-10-07 NOTE — Transfer of Care (Signed)
Immediate Anesthesia Transfer of Care Note  Patient: James Stevens  Procedure(s) Performed: Procedure(s): AORTA BIFEMORAL BYPASS GRAFT (N/A)  Patient Location: PACU  Anesthesia Type:General  Level of Consciousness: awake, oriented, patient cooperative and lethargic  Airway & Oxygen Therapy: Patient Spontanous Breathing and Patient connected to nasal cannula oxygen  Post-op Assessment: Report given to PACU RN, Post -op Vital signs reviewed and stable and Patient moving all extremities X 4  Post vital signs: Reviewed and stable -- pt continues to be hypertensive (210's/100's) despite Hydralazine bolus and Nitroglycerin gtt.  Dr Randa Evens aware.    Complications: No apparent anesthesia complications

## 2013-10-07 NOTE — Anesthesia Postprocedure Evaluation (Signed)
  Anesthesia Post-op Note  Patient: James Stevens  Procedure(s) Performed: Procedure(s): AORTA BIFEMORAL BYPASS GRAFT (N/A)  Patient Location: PACU  Anesthesia Type:General  Level of Consciousness: awake  Airway and Oxygen Therapy: Patient Spontanous Breathing  Post-op Pain: mild  Post-op Assessment: Post-op Vital signs reviewed  Post-op Vital Signs: Reviewed  Last Vitals:  Filed Vitals:   10/07/13 1500  BP: 175/89  Pulse:   Temp:   Resp:     Complications: No apparent anesthesia complications

## 2013-10-07 NOTE — Anesthesia Procedure Notes (Addendum)
Procedure Name: Intubation Date/Time: 10/07/2013 8:57 AM Performed by: Angelica Pou Pre-anesthesia Checklist: Patient identified, Patient being monitored, Emergency Drugs available, Timeout performed and Suction available Patient Re-evaluated:Patient Re-evaluated prior to inductionOxygen Delivery Method: Circle system utilized Preoxygenation: Pre-oxygenation with 100% oxygen Intubation Type: IV induction Ventilation: Mask ventilation without difficulty Laryngoscope Size: Mac and 4 Grade View: Grade I Tube type: Oral Tube size: 8.5 mm Number of attempts: 1 Airway Equipment and Method: Stylet Placement Confirmation: ETT inserted through vocal cords under direct vision,  breath sounds checked- equal and bilateral and positive ETCO2 Secured at: 23 cm Tube secured with: Tape Dental Injury: Teeth and Oropharynx as per pre-operative assessment

## 2013-10-07 NOTE — H&P (View-Only) (Signed)
Pt operation cancelled today due to positive drug test for cocaine.  Have discussed with pt risk of anesthesia and operation with cocaine present.  This is an elective operation.  Pt states he self medicates with cocaine due to his claudication symptoms. We have rescheduled his operation for Sept 28 and he will need to have a drug test again that morning.  I discussed with pt that claudication is not usually treated with narcotic pain meds and that walking is the best option to improve his walking distance.  He states he is in drug treatment program and I have encouraged him to continue this.  Zakyah Yanes, MD [facility] [place] Office: [phone] Pager: [phone]  

## 2013-10-07 NOTE — Anesthesia Postprocedure Evaluation (Signed)
  Anesthesia Post-op Note  Patient: James Stevens  Procedure(s) Performed: Procedure(s): AORTA BIFEMORAL BYPASS GRAFT (N/A)  Patient Location: PACU  Anesthesia Type:General  Level of Consciousness: awake  Airway and Oxygen Therapy: Patient Spontanous Breathing  Post-op Pain: mild  Post-op Assessment: Post-op Vital signs reviewed  Post-op Vital Signs: Reviewed  Last Vitals:  Filed Vitals:   10/07/13 1353  Temp: 36.6 C    Complications: No apparent anesthesia complications

## 2013-10-07 NOTE — Anesthesia Preprocedure Evaluation (Addendum)
Anesthesia Evaluation  Patient identified by MRN, date of birth, ID band Patient awake  General Assessment Comment:History noted. CE  Reviewed: Allergy & Precautions, H&P , NPO status , Patient's Chart, lab work & pertinent test results  Airway Mallampati: II TM Distance: >3 FB Neck ROM: Full    Dental  (+) Dental Advisory Given, Poor Dentition, Chipped   Pulmonary Current Smoker,  breath sounds clear to auscultation  Pulmonary exam normal       Cardiovascular + Peripheral Vascular Disease Rhythm:Regular Rate:Normal     Neuro/Psych Bipolar Disorder Schizophrenia Cocaine use 2-3 times per week, states none in past 10 days.    GI/Hepatic Neg liver ROS, GERD-  ,  Endo/Other  negative endocrine ROS  Renal/GU negative Renal ROS     Musculoskeletal  (+) Arthritis -,   Abdominal   Peds  Hematology   Anesthesia Other Findings   Reproductive/Obstetrics                        Anesthesia Physical Anesthesia Plan  ASA: III  Anesthesia Plan: General   Post-op Pain Management:    Induction: Intravenous  Airway Management Planned: Oral ETT  Additional Equipment: CVP, PA Cath and Arterial line  Intra-op Plan:   Post-operative Plan: Extubation in OR  Informed Consent: I have reviewed the patients History and Physical, chart, labs and discussed the procedure including the risks, benefits and alternatives for the proposed anesthesia with the patient or authorized representative who has indicated his/her understanding and acceptance.   Dental advisory given  Plan Discussed with: CRNA, Anesthesiologist and Surgeon  Anesthesia Plan Comments:        Anesthesia Quick Evaluation

## 2013-10-07 NOTE — Interval H&P Note (Signed)
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 After consideration of risks, benefits and other options for treatment, the patient has consented to  Procedure(s): AORTA BIFEMORAL BYPASS GRAFT (N/A) as a surgical intervention .  The patient's history has been reviewed, patient examined, no change in status, stable for surgery.  I have reviewed the patient's chart and labs.  Questions were answered to the patient's satisfaction.  Repeat drug screen pending.  If negative for cocaine will proceed.   James Stevens,James Stevens

## 2013-10-07 NOTE — Op Note (Signed)
Procedure: Aortobifemoral bypass  Preoperative diagnosis: Claudication bilateral lower extremities  Postoperative diagnosis: Same  Anesthesia: Gen.  Assistant: Gretta Began, MD, [facility] PA-C, Karsten Ro, PA-c  Operative findings: #1 proximal anastomosis end to side with 16 x 8 mm dacron graft  Operative details: After obtaining informed consent, the patient was taken to the operating room. The patient was placed in supine position on the operating table. After induction of general anesthesia and endotracheal ablation the patient was prepped and draped in usual sterile fashion from the nipples to the toes. Next a longitudinal incision was made in the right groin carried into the subcutaneous tissues down to level the right common femoral artery. The artery was dissected free circumferentially. Overall it was soft. The profunda femoris and superficial femoral arteries were dissected free circumferentially. These were fairly small approximately 3-4 mm in diameter. The common femoral artery was approximately 6 mm in diameter. Several small side branches in the circumflex iliac branches were also dissected free circumferentially and vessel loops placed around these. There was no pulse within the right common femoral artery.  A similar incision was made in the left groin. Incision was carried down into the subcutaneous tissues to level left common femoral artery. This had no pulse within it. This was dissected free circumferentially. Several large side branches were dissected free circumferentially controlled with vessel loops. Superficial femoral and profunda femoris arteries were also dissected free circumferentially and vessel loops placed around these. A vessel loop was also placed around the distal external iliac artery. Retroperitoneal tunnels were started in the groin on both sides with blunt dissection on the dorsal aspect of the artery.  Next a midline laparotomy incision was made extending  from the xiphoid to midway in the umbilicus and pubis. The incision was carried down to the level of the fascia the fascia was incised for the full length of the incision. Next the omentum was reflected superiorly as well as a transverse colon. The small bowel is reflected to the right. The Omni-retractor was brought on to the operative field for assistance in retraction. The retroperitoneum was entered. The inferior mesenteric artery was identified and a vessel loop placed around this. This was a very large vessel 3-4 mm diameter.  Dissection was carried up on the anterior wall of the aorta up to the level of the left renal vein. Aorta was dissected free on its anterior two thirds surface from this level all the way down to the aortic bifurcation.  Retroperitoneal tunnels were created down the left and right iliac arteries to the groin. This was done a retroureteral retrocolic fashion. The left limb was posterior to the inferior mesenteric artery.  An umbilical table placed in the retroperitoneal tunnels.  The patient was given 7000 units of intravenous heparin. After appropriate circulation time, the infrarenal abdominal aorta was clamped just below the inferior mesenteric artery. A vessel loop was used to control the inferior mesenteric artery. An additional aortic clamp was placed just below the renal arteries at the level of the renal vein. A longitudinal opening was made in the aorta just below the renal arteries.  A large chunk of thrombus was removed from the aorta just below the renals.  A 16 x 8 mm dacron graft was brought onto the operative field and beveled slightly. This was then sewn end of graft to side of artery using a running 3-0 Prolene suture. This was done end to side fashion to preserve pelvic circulation since the patient has severe  external iliac artery occlusive disease. Just prior to completion of the anastomosis this was thoroughly flushed. The anastomosis was secured, clamps released,  and there was good pulsatile flow the proximal graft. There was good hemostasis after 2 pledgeted repair stitches. The limbs of the graft were then brought out through the retro-peroneal tunnels to each groin. Attention was then turned to the left groin. The distal external iliac artery was controlled with a vessel loop. The common femoral artery profunda femoris and superficial femoral arteries were controlled with vessel loops. A longitudinal opening was made in the common femoral artery the graft was cut to length and beveled and sewn end graft to side of artery using a running 5-0 Prolene suture. Just prior to completion of the anastomosis it was forebled backbled and thoroughly flushed.   The anastomosis was secured, clamps released, and there was good flow into the distal limb immediately. Patient had a palpable dorsalis pedis and posterior tibial pulse immediately. Hemostasis was obtained in the left groin. Attention was then turned to the right groin. In similar fashion the graft was pulled down and cut to length. A longitudinal opening was made in the right common femoral artery and the graft beveled and sewn end of graft to side of artery using a running 5-0 Prolene suture. Just prior to completion of the anastomosis, it was forebled backbled and thoroughly flushed.   The anastomosis was secured, vessel loops released and there was good pulsatile flow in the right common femoral artery immediately. Patient also had palpable dorsalis pedis and posterior tibial pulse at this point. Hemostasis was obtained the right groin. The abdomen was inspected. The patient had been given 7000 units of heparin during the course of the case. The patient's heparin was reversed with 70 mg of protamine. Hemostasis had been obtained in all incisions at this point. The retroperitoneum was closed with a running 3-0 Vicryl suture. The viscera were returned to their normal location and the Omni retractor was removed. The sigmoid  colon was inspected and found to be pink with no evidence of ischemia. The fascia was then reapproximated using a running #1 PDS suture. The skin was closed with staples. Each groin was then again inspected and thoroughly irrigated. Both were found to be hemostatic. The deep layers the groins were closed in multiple layers or running 3 0 and 3 0 Vicryl suture and a 4 Vicryl subcuticular stitch and the skin. The patient tolerated procedure well and there were no complications. Sponge and needle counts were correct at the end of the case. The patient was taken to the recovery room after extubation in stable condition.   Fabienne Bruns, MD [facility] [place] Office: [phone] Pager: [phone]

## 2013-10-08 ENCOUNTER — Inpatient Hospital Stay (HOSPITAL_COMMUNITY): Payer: Medicaid Other

## 2013-10-08 ENCOUNTER — Encounter (HOSPITAL_COMMUNITY): Payer: Self-pay | Admitting: *Deleted

## 2013-10-08 ENCOUNTER — Institutional Professional Consult (permissible substitution): Payer: Medicaid Other | Admitting: Cardiovascular Disease

## 2013-10-08 LAB — BLOOD GAS, ARTERIAL
Acid-Base Excess: 1.5 mmol/L (ref 0.0–2.0)
Bicarbonate: 26 mEq/L — ABNORMAL HIGH (ref 20.0–24.0)
O2 Content: 2 L/min
O2 SAT: 98.4 %
Patient temperature: 98.6
TCO2: 27.4 mmol/L (ref 0–100)
pCO2 arterial: 44.5 mmHg (ref 35.0–45.0)
pH, Arterial: 7.386 (ref 7.350–7.450)
pO2, Arterial: 110 mmHg — ABNORMAL HIGH (ref 80.0–100.0)

## 2013-10-08 LAB — COMPREHENSIVE METABOLIC PANEL
ALBUMIN: 3.5 g/dL (ref 3.5–5.2)
ALK PHOS: 57 U/L (ref 39–117)
ALT: 47 U/L (ref 0–53)
ANION GAP: 13 (ref 5–15)
AST: 28 U/L (ref 0–37)
BILIRUBIN TOTAL: 0.6 mg/dL (ref 0.3–1.2)
BUN: 7 mg/dL (ref 6–23)
CHLORIDE: 96 meq/L (ref 96–112)
CO2: 25 mEq/L (ref 19–32)
Calcium: 8.9 mg/dL (ref 8.4–10.5)
Creatinine, Ser: 0.97 mg/dL (ref 0.50–1.35)
GFR calc Af Amer: >90 mL/min (ref >90)
GFR calc non Af Amer: >90 mL/min (ref >90)
Glucose, Bld: 147 mg/dL — ABNORMAL HIGH (ref 70–99)
Potassium: 4.6 mEq/L (ref 3.7–5.3)
Sodium: 134 mEq/L — ABNORMAL LOW (ref 137–147)
Total Protein: 6.7 g/dL (ref 6.0–8.3)

## 2013-10-08 LAB — CBC
HEMATOCRIT: 41.1 % (ref 39.0–52.0)
Hemoglobin: 14.5 g/dL (ref 13.0–17.0)
MCH: 32.3 pg (ref 26.0–34.0)
MCHC: 35.3 g/dL (ref 30.0–36.0)
MCV: 91.5 fL (ref 78.0–100.0)
Platelets: 128 10*3/uL — ABNORMAL LOW (ref 150–400)
RBC: 4.49 MIL/uL (ref 4.22–5.81)
RDW: 13.8 % (ref 11.5–15.5)
WBC: 15.9 10*3/uL — ABNORMAL HIGH (ref 4.0–10.5)

## 2013-10-08 LAB — MAGNESIUM: Magnesium: 1.5 mg/dL (ref 1.5–2.5)

## 2013-10-08 LAB — AMYLASE: Amylase: 20 U/L (ref 0–105)

## 2013-10-08 MED ORDER — CHLORHEXIDINE GLUCONATE 0.12 % MT SOLN
15.0000 mL | Freq: Two times a day (BID) | OROMUCOSAL | Status: DC
  Administered 2013-10-08 – 2013-10-11 (×6): 15 mL via OROMUCOSAL
  Filled 2013-10-08 (×10): qty 15

## 2013-10-08 MED ORDER — CETYLPYRIDINIUM CHLORIDE 0.05 % MT LIQD
7.0000 mL | Freq: Two times a day (BID) | OROMUCOSAL | Status: DC
Start: 2013-10-08 — End: 2013-10-12
  Administered 2013-10-09 – 2013-10-11 (×4): 7 mL via OROMUCOSAL

## 2013-10-08 NOTE — Evaluation (Signed)
Physical Therapy Evaluation Patient Details Name: James Stevens MRN: [id number] DOB: 01/05/1961 Today's Date: 10/08/2013   History of Present Illness  53 y.o. male admitted to [facility] on 10/07/13 with bil LE claudication and is s/p aortobifemoral bypass grafting on 10/07/13.  Pt with significant PMHx of chronic back and right hip pain, schizophremia, depression, HA, left and right THA, fracture surgery on left fingers 4/5, and mandible fx surgery.    Clinical Impression  Pt is mobilizing slowly and very guarded due to pain at min assist level with RW.  I anticipate as he continues to mobilize and work with acute therapies that he should progress well enough to go home with a walking assistive device (he owns crutches and depending on progress may need a RW).  At this point I would recommend HHPT f/u which may change as he progresses.     Follow Up Recommendations Home health PT (depends on progress)    Equipment Recommendations  Rolling walker with 5" wheels    Recommendations for Other Services   NA    Precautions / Restrictions Precautions Precautions: Fall Required Braces or Orthoses: Other Brace/Splint Other Brace/Splint: pt has NG tube      Mobility  Bed Mobility Overal bed mobility: Needs Assistance Bed Mobility: Rolling;Sidelying to Sit Rolling: Min assist Sidelying to sit: Min assist       General bed mobility comments: Pt very guarded due to pain during transitions.   Min assist to support trunk and help with the painful transitions.  Verbal cues for log roll to help reduce pain while going to sitting.   Transfers Overall transfer level: Needs assistance Equipment used: Rolling walker (2 wheeled) Transfers: Sit to/from Stand Sit to Stand: Min assist         General transfer comment: Min assist with RW to suppor trunk as he transitioned to standing (slow and over flexed trunk, hips, and knees)  Ambulation/Gait Ambulation/Gait assistance: Min assist Ambulation  Distance (Feet): 3 Feet Assistive device: Rolling walker (2 wheeled) Gait Pattern/deviations: Step-to pattern;Shuffle;Trunk flexed     General Gait Details: Min assist to support trunk and help guide RW around as he took some steps from the bed to the chair.  He was able to support himself with only min assist from therapist with his arms on the RW.  Informed pt that as he felt better we may try crutches for gait, but that RW would be most stable for now.          Balance Overall balance assessment: Needs assistance Sitting-balance support: Feet supported;Bilateral upper extremity supported;No upper extremity supported Sitting balance-Leahy Scale: Fair (limited by pain)     Standing balance support: Bilateral upper extremity supported Standing balance-Leahy Scale: Poor Standing balance comment: needs external support to stand at this time.                              Pertinent Vitals/Pain Pain Assessment: Faces Faces Pain Scale: Hurts even more Pain Location: abdomen and bil groin incision sites Pain Descriptors / Indicators: Aching;Burning Pain Intervention(s): Limited activity within patient's tolerance;Monitored during session;Repositioned;Patient requesting pain meds-RN notified    Home Living Family/patient expects to be discharged to:: Private residence Living Arrangements: Alone   Type of Home: House (per pt he rents a room in a house) Home Access: Stairs to enter Entrance Stairs-Rails: None Entrance Stairs-Number of Steps: 4 Home Layout: One level Home Equipment: Crutches  Prior Function Level of Independence: Independent with assistive device(s)         Comments: used crutches due to claudication PTA        Extremity/Trunk Assessment   Upper Extremity Assessment: Defer to OT evaluation           Lower Extremity Assessment: Generalized weakness (limited by pain)      Cervical / Trunk Assessment: Normal  Communication    Communication: Other (comment) (pt not saying much througout the session)  Cognition Arousal/Alertness: Lethargic Behavior During Therapy: WFL for tasks assessed/performed Overall Cognitive Status: No family/caregiver present to determine baseline cognitive functioning                               Assessment/Plan    PT Assessment Patient needs continued PT services  PT Diagnosis Difficulty walking;Abnormality of gait;Generalized weakness;Acute pain   PT Problem List Decreased strength;Decreased activity tolerance;Decreased balance;Decreased mobility;Decreased knowledge of use of DME;Pain  PT Treatment Interventions DME instruction;Gait training;Stair training;Functional mobility training;Therapeutic activities;Therapeutic exercise;Balance training;Patient/family education;Modalities   PT Goals (Current goals can be found in the Care Plan section) Acute Rehab PT Goals Patient Stated Goal: to decrease pain, get NG tube out of his nose PT Goal Formulation: With patient Time For Goal Achievement: 10/22/13 Potential to Achieve Goals: Good    Frequency Min 3X/week   Barriers to discharge Decreased caregiver support it sounds like pt doesn't have much support at discharge.        End of Session   Activity Tolerance: Patient limited by pain Patient left: in chair;with call bell/phone within reach           Time: 1036-1052 PT Time Calculation (min): 16 min   Charges:   PT Evaluation $Initial PT Evaluation Tier I: 1 Procedure PT Treatments $Therapeutic Activity: 8-22 mins        Caitlin Hillmer B. Donnella Morford, PT, DPT [phone]   10/08/2013, 11:27 AM

## 2013-10-08 NOTE — Progress Notes (Signed)
Pt transferred to 2w19. Report called to receiving RN . Pt stable at time of transport. All belongings were sent with patient at time of transport

## 2013-10-08 NOTE — Progress Notes (Signed)
[facility] [place]  Subjective  -Awake following commands, wants NG out   Objective 141/92 76 99.3 F (37.4 C) (Core (Comment)) 18 99%  Intake/Output Summary (Last 24 hours) at 10/08/13 0805 Last data filed at 10/08/13 0657  Gross per 24 hour  Intake 5091.25 ml  Output   4370 ml  Net 721.25 ml   Abdomen flat, dressings dry 2+ DP/PT pulses Moves extremities to command  Assessment/Planning: POD #1 ABF D/c aline D/c swan and sleeve OOB ambulate Post op ileus Keep NG until passing flatus Leukocytosis most likely reactive trend Transfer 2W IVF fluid to 100 mL/hr Cbc/bmet daily for now  James Stevens E 10/08/2013 8:05 AM --  Laboratory Lab Results:  Recent Labs  10/07/13 1345 10/08/13 0431  WBC 18.3* 15.9*  HGB 13.3 14.5  HCT 38.9* 41.1  PLT 164 128*   BMET  Recent Labs  10/07/13 1345 10/08/13 0431  NA 140 134*  K 4.2 4.6  CL 105 96  CO2 26 25  GLUCOSE 189* 147*  BUN 6 7  CREATININE 0.93 0.97  CALCIUM 8.6 8.9    COAG Lab Results  Component Value Date   INR 1.13 10/07/2013   INR 1.00 10/07/2013   INR 1.08 09/20/2013   No results found for this basename: PTT

## 2013-10-08 NOTE — Evaluation (Signed)
Occupational Therapy Evaluation Patient Details Name: James Stevens MRN: [id number] DOB: 02/01/1960 Today's Date: 10/08/2013    History of Present Illness 53 y.o. male admitted to [facility] on 10/07/13 with bil LE claudication and is s/p aortobifemoral bypass grafting on 10/07/13.  Pt with significant PMHx of chronic back and right hip pain, schizophremia, depression, HA, left and right THA, fracture surgery on left fingers 4/5, and mandible fx surgery.     Clinical Impression   Prior to admission, pt was independent in self care and had crutches, but unclear whether he used them.  Pt moving slowly with obvious pain, but only required minimum assistance.  Needs assist for standing activities and to access feet due to impaired balance and abdominal pain.  Pt is likely to progress well as medical issues resolve and mobility increases.  Will follow acutely.   Follow Up Recommendations  Home health OT (Pt has Medicaid)    Equipment Recommendations       Recommendations for Other Services       Precautions / Restrictions Precautions Precautions: Fall Required Braces or Orthoses: Other Brace/Splint Other Brace/Splint: pt has NG tube Restrictions Weight Bearing Restrictions: No      Mobility Bed Mobility General bed mobility comments:not tested, pt up in chair  Transfers Overall transfer level: Needs assistance Equipment used: Rolling walker (2 wheeled) Transfers: Sit to/from Stand Sit to Stand: Min assist         General transfer comment: moves slowly, difficulty standing upright due to pain    Balance Overall balance assessment: Needs assistance Sitting-balance support: Feet supported;Bilateral upper extremity supported;No upper extremity supported Sitting balance-Leahy Scale: Fair (limited by pain)     Standing balance support: Bilateral upper extremity supported Standing balance-Leahy Scale: Poor Standing balance comment: needs external support to stand at this time.                              ADL Overall ADL's : Needs assistance/impaired Eating/Feeding: NPO (pt with NGT)   Grooming: Wash/dry hands;Set up;Sitting   Upper Body Bathing: Set up;Sitting   Lower Body Bathing: Minimal assistance;Sit to/from stand   Upper Body Dressing : Set up;Sitting   Lower Body Dressing: Minimal assistance;Sit to/from stand               Functional mobility during ADLs: Minimal assistance;Rolling walker       Vision                     Perception     Praxis      Pertinent Vitals/Pain Pain Assessment: Faces Pain Score: 6  Faces Pain Scale: Hurts even more Pain Location: abdomen Pain Descriptors / Indicators: Aching Pain Intervention(s): Monitored during session;Limited activity within patient's tolerance     Hand Dominance Right   Extremity/Trunk Assessment Upper Extremity Assessment Upper Extremity Assessment: Overall WFL for tasks assessed (fractured 3rd and 4th fingers, s/p ORIF)   Lower Extremity Assessment Lower Extremity Assessment: Defer to PT evaluation   Cervical / Trunk Assessment Cervical / Trunk Assessment: Normal   Communication Communication Communication: Other (comment) (primarily shook head, difficult to understand pts speech)   Cognition Arousal/Alertness: Lethargic Behavior During Therapy: Flat affect Overall Cognitive Status: No family/caregiver present to determine baseline cognitive functioning                     General Comments       Exercises  Shoulder Instructions      Home Living Family/patient expects to be discharged to:: Private residence Living Arrangements: Alone   Type of Home: House (rents a room within a house) Home Access: Stairs to enter Secretary/administrator of Steps: 4 Entrance Stairs-Rails: None Home Layout: One level     Bathroom Shower/Tub: Chief Strategy Officer: Standard     Home Equipment: Crutches   Additional Comments: pt not  always consistent with his response to questions about home situation      Prior Functioning/Environment Level of Independence: Independent with assistive device(s)        Comments: used crutches due to claudication PTA    OT Diagnosis: Generalized weakness;Acute pain   OT Problem List: Pain;Decreased strength;Impaired balance (sitting and/or standing);Decreased knowledge of use of DME or AE   OT Treatment/Interventions: Self-care/ADL training;Therapeutic activities;DME and/or AE instruction;Patient/family education;Balance training    OT Goals(Current goals can be found in the care plan section) Acute Rehab OT Goals Patient Stated Goal: to decrease pain, get NG tube out of his nose OT Goal Formulation: With patient Time For Goal Achievement: 10/22/13 Potential to Achieve Goals: Good ADL Goals Pt Will Perform Grooming: with modified independence;standing Pt Will Perform Lower Body Bathing: with modified independence;sit to/from stand Pt Will Perform Lower Body Dressing: with modified independence;sit to/from stand Pt Will Transfer to Toilet: with modified independence;ambulating;regular height toilet Pt Will Perform Toileting - Clothing Manipulation and hygiene: with modified independence;sit to/from stand Pt Will Perform Tub/Shower Transfer: Tub transfer;ambulating;rolling walker  OT Frequency: Min 2X/week   Barriers to D/C:            Co-evaluation              End of Session Equipment Utilized During Treatment: Rolling walker  Activity Tolerance: Patient limited by pain;Patient limited by fatigue;Patient limited by lethargy Patient left: in chair;with call bell/phone within reach   Time: 1232-1258 OT Time Calculation (min): 26 min Charges:  OT General Charges $OT Visit: 1 Procedure OT Evaluation $Initial OT Evaluation Tier I: 1 Procedure OT Treatments $Self Care/Home Management : 8-22 mins G-Codes:    [name] 10/08/2013, 1:33  PM [phone]

## 2013-10-09 ENCOUNTER — Encounter (HOSPITAL_COMMUNITY): Payer: Self-pay | Admitting: Vascular Surgery

## 2013-10-09 LAB — BASIC METABOLIC PANEL
Anion gap: 12 (ref 5–15)
BUN: 8 mg/dL (ref 6–23)
CHLORIDE: 98 meq/L (ref 96–112)
CO2: 26 mEq/L (ref 19–32)
Calcium: 9.5 mg/dL (ref 8.4–10.5)
Creatinine, Ser: 1.09 mg/dL (ref 0.50–1.35)
GFR calc non Af Amer: 76 mL/min — ABNORMAL LOW (ref >90)
GFR, EST AFRICAN AMERICAN: 88 mL/min — AB (ref >90)
GLUCOSE: 129 mg/dL — AB (ref 70–99)
Potassium: 4.5 mEq/L (ref 3.7–5.3)
SODIUM: 136 meq/L — AB (ref 137–147)

## 2013-10-09 LAB — CBC
HEMATOCRIT: 42.7 % (ref 39.0–52.0)
Hemoglobin: 14.7 g/dL (ref 13.0–17.0)
MCH: 32.4 pg (ref 26.0–34.0)
MCHC: 34.4 g/dL (ref 30.0–36.0)
MCV: 94.1 fL (ref 78.0–100.0)
Platelets: 102 10*3/uL — ABNORMAL LOW (ref 150–400)
RBC: 4.54 MIL/uL (ref 4.22–5.81)
RDW: 13.7 % (ref 11.5–15.5)
WBC: 14.2 10*3/uL — AB (ref 4.0–10.5)

## 2013-10-09 MED ORDER — SODIUM CHLORIDE 0.45 % IV SOLN
INTRAVENOUS | Status: DC
  Administered 2013-10-09 – 2013-10-11 (×3): via INTRAVENOUS

## 2013-10-09 MED ORDER — OXYCODONE-ACETAMINOPHEN 5-325 MG PO TABS
1.0000 | ORAL_TABLET | ORAL | Status: DC | PRN
  Administered 2013-10-09 – 2013-10-11 (×7): 1 via ORAL
  Filled 2013-10-09 (×8): qty 1

## 2013-10-09 MED FILL — Sodium Chloride Irrigation Soln 0.9%: Qty: 3000 | Status: AC

## 2013-10-09 MED FILL — Heparin Sodium (Porcine) Inj 1000 Unit/ML: INTRAMUSCULAR | Qty: 30 | Status: AC

## 2013-10-09 MED FILL — Sodium Chloride IV Soln 0.9%: INTRAVENOUS | Qty: 1000 | Status: AC

## 2013-10-09 NOTE — Progress Notes (Signed)
  [facility] Progress Note  10/09/2013 7:29 AM 2 Days Post-Op  Subjective:  Having abdominal pain. Wants to NG tube out. Passing flatus. Denies nausea. No BM.   Tmax 99.1 BP sys 130s-150s 02 92% RA Filed Vitals:   10/09/13 0448  BP: 155/89  Pulse: 84  Temp: 98 F (36.7 C)  Resp: 18    Physical Exam: Abdomen: hypoactive bowel sounds, soft, no distension. Moderate tenderness to palpation. NG tube with 400 cc bilious output Incisions:  Median staple line clean dry and intact. Bilateral groin incisions clean and intact.  Extremities:  2+ DP/PT pulses bilaterally Cardiac: regular rate and rhythm   CBC    Component Value Date/Time   WBC 14.2* 10/09/2013 0412   RBC 4.54 10/09/2013 0412   HGB 14.7 10/09/2013 0412   HCT 42.7 10/09/2013 0412   PLT PENDING 10/09/2013 0412   MCV 94.1 10/09/2013 0412   MCH 32.4 10/09/2013 0412   MCHC 34.4 10/09/2013 0412   RDW 13.7 10/09/2013 0412   LYMPHSABS 2.2 06/28/2010 1712   MONOABS 1.3* 06/28/2010 1712   EOSABS 0.1 06/28/2010 1712   BASOSABS 0.0 06/28/2010 1712    BMET    Component Value Date/Time   NA 136* 10/09/2013 0412   K 4.5 10/09/2013 0412   CL 98 10/09/2013 0412   CO2 26 10/09/2013 0412   GLUCOSE 129* 10/09/2013 0412   BUN 8 10/09/2013 0412   CREATININE 1.09 10/09/2013 0412   CALCIUM 9.5 10/09/2013 0412   GFRNONAA 76* 10/09/2013 0412   GFRAA 88* 10/09/2013 0412    INR    Component Value Date/Time   INR 1.13 10/07/2013 1345     Intake/Output Summary (Last 24 hours) at 10/09/13 0729 Last data filed at 10/09/13 0451  Gross per 24 hour  Intake 282.92 ml  Output   3100 ml  Net -2817.08 ml     Assessment:  53 y.o. male is s/p: Aortobifemoral bypass   2 Days Post-Op  Plan: -NG tube with 400 cc output. Denies nausea. Passing flatus now. Will discuss with Dr. Darrick PennaFields about d/c'ing NG tube and starting on clear liquids. -Leukocytosis: trending down, likely reactive postoperatively.  -D/c  foley -Mobilize -Encourage IS -DVT prophylaxis:  SCDs   Maris BergerKimberly Trinh, PA-C [facility] Office: [phone] Pager: [phone] 10/09/2013 7:29 AM   Agree with above.  Incisions healing Pedal pulses palpable Will d/c NG D/c foley Keep Npo for now Ambulate Possibly clear liquids tomorrow if tolerates NG out  Fabienne Brunsharles Fields, MD [facility] [place] Office: [phone] Pager: [phone]

## 2013-10-09 NOTE — Progress Notes (Signed)
Physical Therapy Treatment Patient Details Name: James Stevens MRN: [id number] DOB: 09/02/1960 Today's Date: 10/09/2013    History of Present Illness 53 y.o. male [facility] with bil LE claudication and is s/p aortobifemoral bypass grafting on 10/07/13.  Pt with significant PMHx of chronic back and right hip pain, schizophremia, depression, HA, left and right THA, fracture surgery on left fingers 4/5, and mandible fx surgery.      PT Comments    Pt is progressing well with his mobility.  Moving slowly and needs encouragement to stand up straight, but progressing well.  He informed me today that he does have a RW at home (vs the axillary crutches that are in his room), but that he was using the crutches PTA.  We may try the crutches next session to see which he likes better the crutches or the RW.  PT will continue to follow acutely.  Follow Up Recommendations  Home health PT     Equipment Recommendations  None recommended by PT    Recommendations for Other Services   NA     Precautions / Restrictions Precautions Precautions: Fall    Mobility  Bed Mobility Overal bed mobility: Needs Assistance Bed Mobility: Sit to Sidelying         Sit to sidelying: Min assist General bed mobility comments: Min assist to help support feet during transition to supine.  Pt instructed on reverse log roll to get back into the bed to decrease the strain on his lower abdomen and legs.   Transfers Overall transfer level: Needs assistance Equipment used: Rolling walker (2 wheeled) Transfers: Sit to/from Stand Sit to Stand: Supervision         General transfer comment: Supervision for safety due to slow speed of transitions.   Ambulation/Gait Ambulation/Gait assistance: Min guard Ambulation Distance (Feet): 45 Feet Assistive device: Rolling walker (2 wheeled) Gait Pattern/deviations: Step-through pattern;Shuffle;Trunk flexed Gait velocity: decreased Gait velocity  interpretation: Below normal speed for age/gender General Gait Details: Min guard assist for safety due to slow speed of gait.  Verbal cues for upright posture and to stay inside of RW.           Balance Overall balance assessment: Needs assistance Sitting-balance support: Feet supported;No upper extremity supported Sitting balance-Leahy Scale: Good     Standing balance support: Bilateral upper extremity supported;No upper extremity supported Standing balance-Leahy Scale: Fair                      Cognition Arousal/Alertness: Awake/alert Behavior During Therapy: WFL for tasks assessed/performed Overall Cognitive Status: Within Functional Limits for tasks assessed                             Pertinent Vitals/Pain Pain Assessment: Faces Faces Pain Scale: Hurts little more Pain Location: abdomen Pain Descriptors / Indicators: Aching;Burning Pain Intervention(s): Limited activity within patient's tolerance;Monitored during session;Repositioned    Home Living               Home Equipment: Crutches;Walker - 2 wheels          PT Goals (current goals can now be found in the care plan section) Acute Rehab PT Goals Patient Stated Goal: to decrease pain, get NG tube out of his nose Progress towards PT goals: Progressing toward goals    Frequency  Min 3X/week    PT Plan Current plan remains appropriate  End of Session Equipment Utilized During Treatment: Gait belt Activity Tolerance: Patient limited by pain Patient left: in bed;with call bell/phone within reach;with bed alarm set     Time: 1659-1716 PT Time Calculation (min): 17 min  Charges:  $Gait Training: 8-22 mins            Sorren Vallier B. Wendi Lastra, PT, DPT [phone]   10/09/2013, 5:58 PM

## 2013-10-09 NOTE — Care Management Note (Unsigned)
Page 1 of 1   10/10/2013     4:28:00 PM CARE MANAGEMENT NOTE 10/10/2013  Patient:  James Stevens, James Stevens   Account Number:  [id number]  Date Initiated:  10/09/2013  Documentation initiated by:  Alyxis Grippi  Subjective/Objective Assessment:   Pt s/p aorto-iliac bypass graft on 10/07/13.  PTA, pt independent of ADLS.     Action/Plan:   PT/OT evals pending.  Will follow for dc needs.   Anticipated DC Date:  10/11/2013   Anticipated DC Plan:  [place]  CM consult      Choice offered to / List presented to:     DME arranged  [facility]      DME agency  [facility].        Status of service:  In process, will continue to follow Medicare Important Message given?   (If response is "NO", the following Medicare IM given date fields will be blank) Date Medicare IM given:   Medicare IM given by:   Date Additional Medicare IM given:   Additional Medicare IM given by:    Discharge Disposition:    Per UR Regulation:  Reviewed for med. necessity/level of care/duration of stay  If discussed at [place] of Stay Meetings, dates discussed:    Comments:  10/10/13 Ellan Lambert, RN, BSN [phone] Met with pt to discuss dc plans.  Pt states he rents a room in a house, and has "plenty of people" to help him at dc. He would like tub seat, as recommended by OT. Unfortunately, HHPT/OT not covered by Medicaid only.  Pt states he would like a nurse to check on him if possible to make sure he is doing ok at home, and wound is healing ok. MD, if you agree, please order [facility] and complete face to face documentation.   Thanks.

## 2013-10-10 LAB — TYPE AND SCREEN
ABO/RH(D): O POS
ANTIBODY SCREEN: NEGATIVE
UNIT DIVISION: 0
Unit division: 0

## 2013-10-10 LAB — BASIC METABOLIC PANEL
Anion gap: 10 (ref 5–15)
BUN: 12 mg/dL (ref 6–23)
CHLORIDE: 101 meq/L (ref 96–112)
CO2: 26 mEq/L (ref 19–32)
Calcium: 9.9 mg/dL (ref 8.4–10.5)
Creatinine, Ser: 1.29 mg/dL (ref 0.50–1.35)
GFR calc non Af Amer: 62 mL/min — ABNORMAL LOW (ref >90)
GFR, EST AFRICAN AMERICAN: 72 mL/min — AB (ref >90)
GLUCOSE: 113 mg/dL — AB (ref 70–99)
Potassium: 4.6 mEq/L (ref 3.7–5.3)
Sodium: 137 mEq/L (ref 137–147)

## 2013-10-10 MED ORDER — PNEUMOCOCCAL VAC POLYVALENT 25 MCG/0.5ML IJ INJ
0.5000 mL | INJECTION | INTRAMUSCULAR | Status: DC
  Filled 2013-10-10: qty 0.5

## 2013-10-10 MED ORDER — HYDROCODONE-ACETAMINOPHEN 5-325 MG PO TABS: ORAL_TABLET | ORAL | Status: DC

## 2013-10-10 MED ORDER — INFLUENZA VAC SPLIT QUAD 0.5 ML IM SUSY
0.5000 mL | PREFILLED_SYRINGE | INTRAMUSCULAR | Status: AC
  Administered 2013-10-11: 0.5 mL via INTRAMUSCULAR
  Filled 2013-10-10: qty 0.5

## 2013-10-10 NOTE — Progress Notes (Signed)
Occupational Therapy Treatment Patient Details Name: Lavell AnchorsDaniel J Hays MRN: [id number] DOB: 07/12/1960 Today's Date: 10/10/2013    History of present illness 53 y.o. male [facility] with bil LE claudication and is s/p aortobifemoral bypass grafting on 10/07/13.  Pt with significant PMHx of chronic back and right hip pain, schizophremia, depression, HA, left and right THA, fracture surgery on left fingers 4/5, and mandible fx surgery.     OT comments  This 53 yo male seen today and making progress with general mobility and self care. He will benefit from at least one more session of OT to go over AE use.   Follow Up Recommendations  No OT follow up    Equipment Recommendations  Tub/shower seat       Precautions / Restrictions Precautions Precautions: Fall Restrictions Weight Bearing Restrictions: No       Mobility Bed Mobility Overal bed mobility: Modified Independent Bed Mobility: Supine to Sit     Supine to sit: Modified independent (Device/Increase time);HOB elevated (increase time)        Transfers Overall transfer level: Needs assistance Equipment used: Crutches Transfers: Sit to/from Stand Sit to Stand: Supervision                  ADL Overall ADL's : Needs assistance/impaired                         Toilet Transfer: Minimal assistance;Ambulation (crutches, bed>around circle on 2W>recliner)                              Cognition   Behavior During Therapy: WFL for tasks assessed/performed Overall Cognitive Status: Within Functional Limits for tasks assessed                                    Pertinent Vitals/ Pain       Pain Assessment: 0-10 Pain Score: 7  Pain Location: abdomen with coughing Pain Intervention(s): Monitored during session (pt uses pillow when he coughs)         Frequency Min 3X/week     Progress Toward Goals  OT Goals(current goals can now be found in the care plan  section)  Progress towards OT goals: Progressing toward goals     Plan Discharge plan needs to be updated       End of Session Equipment Utilized During Treatment:  (crutches (4/5 of distance); none last 1/5 of back to his room)   Activity Tolerance Patient tolerated treatment well   Patient Left in chair;with call bell/phone within reach   Nurse Communication  (Pt wanted his wounds checked ("I can smell them"))        Time: [id number] OT Time Calculation (min): 26 min  Charges: OT General Charges $OT Visit: 1 Procedure OT Treatments $Self Care/Home Management : 23-37 mins  Evette GeorgesLeonard, Graylyn Bunney Eva [phone] 10/10/2013, 11:12 AM

## 2013-10-10 NOTE — Progress Notes (Signed)
  [facility] Progress Note  10/10/2013 7:53 AM 3 Days Post-Op  Subjective:  Doing well today. Abdominal soreness improved. Denies nausea/vomiting. Passing flatus. No BM. Has been ambulating in the halls.  Tmax 99.9 BP sys 100s-130s 02 97%  Filed Vitals:   10/10/13 0512  BP: 102/67  Pulse: 95  Temp: 98.8 F (37.1 C)  Resp: 18    Physical Exam: Incisions:  Midline abdominal and groin incisions clean dry and intact.  Abdomen: hypoactive bowel sounds, soft, no distension, mild tenderness to palpation Extremities:  Palpable 2+ DP/PT pulses bilaterally  CBC    Component Value Date/Time   WBC 14.2* 10/09/2013 0412   RBC 4.54 10/09/2013 0412   HGB 14.7 10/09/2013 0412   HCT 42.7 10/09/2013 0412   PLT 102* 10/09/2013 0412   MCV 94.1 10/09/2013 0412   MCH 32.4 10/09/2013 0412   MCHC 34.4 10/09/2013 0412   RDW 13.7 10/09/2013 0412   LYMPHSABS 2.2 06/28/2010 1712   MONOABS 1.3* 06/28/2010 1712   EOSABS 0.1 06/28/2010 1712   BASOSABS 0.0 06/28/2010 1712    BMET    Component Value Date/Time   NA 137 10/10/2013 0515   K 4.6 10/10/2013 0515   CL 101 10/10/2013 0515   CO2 26 10/10/2013 0515   GLUCOSE 113* 10/10/2013 0515   BUN 12 10/10/2013 0515   CREATININE 1.29 10/10/2013 0515   CALCIUM 9.9 10/10/2013 0515   GFRNONAA 62* 10/10/2013 0515   GFRAA 72* 10/10/2013 0515    INR    Component Value Date/Time   INR 1.13 10/07/2013 1345     Intake/Output Summary (Last 24 hours) at 10/10/13 0753 Last data filed at 10/10/13 0100  Gross per 24 hour  Intake      0 ml  Output   2375 ml  Net  -2375 ml     Assessment:  53 y.o. male is s/p:  Aortobifemoral bypass 3 Days Post-Op  Plan: -No nausea yesterday with removal of NG tube. Will advance to clear liquids today. -Leukocytosis: likely reactive, continuing to trend down -Continue mobilization. -Good urine output. -DVT prophylaxis:  SCDs   Maris BergerKimberly Trinh, PA-C [facility] Office:  [phone] Pager: [phone] 10/10/2013 7:53 AM     Tolerating clear liquids so far Easily palpable pulses Healing incisions  Ambulate today Regular diet tomorrow if tolerates clears Most likely d/c Oct 3  Fabienne Brunsharles Tryone Kille, MD [facility] [place] Office: [phone] Pager: [phone]

## 2013-10-11 ENCOUNTER — Telehealth: Payer: Self-pay | Admitting: Vascular Surgery

## 2013-10-11 LAB — BASIC METABOLIC PANEL
Anion gap: 13 (ref 5–15)
BUN: 15 mg/dL (ref 6–23)
CHLORIDE: 101 meq/L (ref 96–112)
CO2: 25 mEq/L (ref 19–32)
Calcium: 9.3 mg/dL (ref 8.4–10.5)
Creatinine, Ser: 1.28 mg/dL (ref 0.50–1.35)
GFR, EST AFRICAN AMERICAN: 72 mL/min — AB (ref >90)
GFR, EST NON AFRICAN AMERICAN: 62 mL/min — AB (ref >90)
Glucose, Bld: 90 mg/dL (ref 70–99)
POTASSIUM: 4.2 meq/L (ref 3.7–5.3)
Sodium: 139 mEq/L (ref 137–147)

## 2013-10-11 MED ORDER — OXYCODONE-ACETAMINOPHEN 5-325 MG PO TABS
1.0000 | ORAL_TABLET | Freq: Four times a day (QID) | ORAL | Status: DC | PRN
  Administered 2013-10-11 – 2013-10-12 (×4): 2 via ORAL
  Filled 2013-10-11: qty 2
  Filled 2013-10-11 (×2): qty 1
  Filled 2013-10-11 (×2): qty 2

## 2013-10-11 NOTE — Progress Notes (Signed)
Physical Therapy Discharge Patient Details Name: James Stevens MRN: [id number] DOB: 06/22/1960 Today's Date: 10/11/2013 Time: 1203-1213 PT Time Calculation (min): 10 min  Patient discharged from PT services secondary to goals met and no further PT needs identified.  Please see latest therapy progress note for current level of functioning and progress toward goals.    Progress and discharge plan discussed with patient and/or caregiver: Patient/Caregiver agrees with plan  GP     [facility] 10/11/2013, 2:56 PM

## 2013-10-11 NOTE — Progress Notes (Signed)
Occupational Therapy Treatment Patient Details Name: James Stevens MRN: [id number] DOB: October 13, 1960 Today's Date: 10/11/2013    History of present illness 53 y.o. male admitted to [facility] on 10/07/13 with bil LE claudication and is s/p aortobifemoral bypass grafting on 10/07/13.  Pt with significant PMHx of chronic back and right hip pain, schizophremia, depression, HA, left and right THA, fracture surgery on left fingers 4/5, and mandible fx surgery.     OT comments  Pt able to return demonstrated use of AE after instruction.  He is happy to have this as he cannot reach down to feet.   Follow Up Recommendations  No OT follow up    Equipment Recommendations  Tub/shower seat    Recommendations for Other Services      Precautions / Restrictions Precautions Precautions: Fall       Mobility Bed Mobility   Bed Mobility: Supine to Sit Rolling: Supervision         General bed mobility comments: used bedrail and lifted HOB up to get oob; reinforced log rolling at home from flat bed.   Transfers   Equipment used: Crutches Transfers: Sit to/from [employer] to Stand: Supervision         General transfer comment: for safety    Balance                                   ADL               Lower Body Bathing: Sit to/from stand;With adaptive equipment;Supervison/ safety       Lower Body Dressing: Sit to/from stand;With adaptive equipment;Supervision/safety                 General ADL Comments: issued AE kit and pt practiced with this.  He simulated pants and performed socks:  did not wish to perform whole adl this am.  Educated on body position for retrieving clothes.  He ambulated around room with crutches with supervision then wanted to walk a loop in the hall:  At end of loop, he used the crutches like hiking poles       Vision                     Perception     Praxis      Cognition   Behavior During Therapy: [facility] for tasks  assessed/performed Overall Cognitive Status: Within Functional Limits for tasks assessed                        Extremity/Trunk Assessment               Exercises     Shoulder Instructions       General Comments      Pertinent Vitals/ Pain       Pain Assessment: 0-10 Pain Score: 6  Pain Location: abdomen Pain Intervention(s): Limited activity within patient's tolerance;Monitored during session;Repositioned  Home Living                                          Prior Functioning/Environment              Frequency Min 3X/week     Progress Toward Goals  OT Goals(current goals can now be found in the care plan section)  Progress towards OT goals: Progressing toward goals     Plan      Co-evaluation                 End of Session     Activity Tolerance Patient tolerated treatment well   Patient Left in chair;with call bell/phone within reach   Nurse Communication          Time: 5625-6389 OT Time Calculation (min): 29 min  Charges: OT General Charges $OT Visit: 1 Procedure OT Treatments $Self Care/Home Management : 23-37 mins  Terrel Manalo 10/11/2013, 9:45 AM  Lesle Chris, OTR/L [phone] 10/11/2013

## 2013-10-11 NOTE — Discharge Summary (Signed)
[facility] Discharge Summary  James AnchorsDaniel J Stevens 09/15/1960 53 y.o. 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 male who presented for evaluation of bilateral lower extremity claudication calf and hip with left foot rest pain. The patient states he has had off and on problems with his hips and thighs for 4 years on the right after hip replacement at least 2 years on the left after hip replacement. He has pain in his hips and thighs after walking less than half a block. He also develops claudication symptoms in the calf bilaterally. He is also develops rest pain in the left foot that has been going on for approximate 6 months. He is a smoker. He was counseled today greater than 3 minutes and smoking cessation. He denies any nonhealing wounds on the feet. Other medical problems include arthritis, reflux, schizophrenia/bipolar all of which are currently stable. He was recently started on Pletal by Dr. Cathlean MarseillesAubere.  Hospital Course:  The patient was admitted to the hospital and taken to the operating room on 10/07/2013 and underwent: aortobifemoral bypass. He was extubated in the OR. The patient tolerated the procedure well and was transported to the PACU in stable condition. He was transferred to the ICU.   On POD 1, he had no output out of his NG tube. He denied any nausea. He was having appropriate post operative pain. He had easily palpable pedal pulses.  He was transferred to 2W.  On POD 2, he started passing flatus. His NG tube had 400 cc of brown bilious output. His NG tube was discontinued and he was kept NPO.  On POD 3, he was started on clear liquids. He was encouraged to continue mobilization.   On POD 4, he had tolerated clear liquids well and was advanced to a regular diet. He was continuing to  pass flatus and had two bowel movements. He continued to have easily palpable pulses and his incisions were clean and intact. He was ambulating well.  On POD 5, he is doing well with solid foods.  He was on lopressor 2.5 mg IV q6h after surgery.  This was held a couple of times for systolic pressure of 90.  He is coreg 6.25 mg bid at home.  We will restart this low dose as his HR is 90's-100's and his systolic pressure is greater than 105.  Discussed importance of smoking cessation.  Pt states he does want and need to quit.  The remainder of the hospital course consisted of increasing mobilization and increasing intake of solids without difficulty.  CBC    Component Value Date/Time   WBC 14.2* 10/09/2013 0412   RBC 4.54 10/09/2013 0412   HGB 14.7 10/09/2013 0412   HCT 42.7 10/09/2013 0412   PLT 102* 10/09/2013 0412   MCV 94.1 10/09/2013 0412   MCH 32.4 10/09/2013 0412   MCHC 34.4 10/09/2013 0412   RDW 13.7 10/09/2013 0412   LYMPHSABS 2.2 06/28/2010 1712   MONOABS 1.3* 06/28/2010 1712   EOSABS 0.1 06/28/2010 1712   BASOSABS 0.0 06/28/2010 1712    BMET    Component Value Date/Time   NA 139 10/11/2013 0312   K 4.2 10/11/2013 0312   CL 101 10/11/2013 0312   CO2 25 10/11/2013 0312   GLUCOSE 90 10/11/2013 0312   BUN 15 10/11/2013 0312  CREATININE 1.28 10/11/2013 0312   CALCIUM 9.3 10/11/2013 0312   GFRNONAA 62* 10/11/2013 0312   GFRAA 72* 10/11/2013 0312     Discharge Instructions:   The patient is discharged to home with extensive instructions on wound care and progressive ambulation.  They are instructed not to drive or perform any heavy lifting until returning to see the physician in his office.  Discharge Instructions   ABDOMINAL PROCEDURE/ANEURYSM REPAIR/AORTO-BIFEMORAL BYPASS:  Call MD for increased abdominal pain; cramping diarrhea; nausea/vomiting    Complete by:  As directed      Call MD for:  redness, tenderness, or signs of infection (pain, swelling, bleeding, redness, odor or  green/yellow discharge around incision site)    Complete by:  As directed      Call MD for:  severe or increased pain, loss or decreased feeling  in affected limb(s)    Complete by:  As directed      Call MD for:  temperature >100.5    Complete by:  As directed      Driving Restrictions    Complete by:  As directed   No driving for 2 weeks     Increase activity slowly    Complete by:  As directed   Walk with assistance use walker or cane as needed     Lifting restrictions    Complete by:  As directed   No lifting for 4 weeks     Resume previous diet    Complete by:  As directed      may wash over wound with mild soap and water    Complete by:  As directed            Discharge Diagnosis:  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 Aortoiliac occlusive disease 10/07/2013  . Atherosclerosis of native arteries of the extremities with intermittent claudication 09/05/2013  . Peripheral vascular disease, unspecified 08/29/2013  . Avascular necrosis of hip 04/17/2012   Past Medical History  Diagnosis Date  . Chronic back pain     and right hip  . GERD (gastroesophageal reflux disease)     occ tums  . Arthritis   . Hip pain   . Schizophrenia   . Bipolar affective   . 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 Depression   . Headache(784.0)     migraines       Medication List    STOP taking these medications       cilostazol 100 MG tablet  Commonly known as:  PLETAL      TAKE these medications       aspirin EC 325 MG tablet  Take 1 tablet (325 mg total) by mouth 2 (two) times daily.     atorvastatin 80 MG tablet  Commonly known as:  LIPITOR  Take 80 mg by mouth daily.     carvedilol 6.25 MG tablet  Commonly known as:  COREG  Take 6.25 mg by mouth 2 (two) times daily with a meal.     ferrous sulfate 325 (65  FE) MG tablet  Take 1 tablet (325 mg total) by mouth 3 (three) times daily after meals.     HYDROcodone-acetaminophen 5-325 MG per tablet  Commonly known as:  NORCO/VICODIN  Take 1-2 tablets by mouth every 6 hours as needed for pain.  meloxicam 7.5 MG tablet  Commonly known as:  MOBIC  Take 7.5 mg by mouth daily.     metroNIDAZOLE 500 MG tablet  Commonly known as:  FLAGYL  2 grams by mouth once     tiZANidine 4 MG capsule  Commonly known as:  ZANAFLEX  Take 4 mg by mouth as needed for muscle spasms.     traZODone 100 MG tablet  Commonly known as:  DESYREL  Take 100 mg by mouth at bedtime.        Percoet #30 No Refill  Disposition: Home  Patient's condition: is Good  Follow up: 1. Dr. Darrick Penna in 2 weeks   Maris Berger, PA-C [facility] [phone] 10/11/2013  10:34 AM   - For VQI Registry use ---   Post-op:  Time to Extubation: [x]  In OR, [ ]  < 12 hrs, [ ]  12-24 hrs, [ ]  >=24 hrs Vasopressors Req. Post-op: No ICU Stay: 1 days Transfusion: No   MI: No, [ ]  Troponin only, [ ]  EKG or Clinical New Arrhythmia: No  Complications: CHF: No Resp failure: No, [ ]  Pneumonia, [ ]  Ventilator Chg in renal function: No, [ ]  Inc. Cr > 0.5, [ ]  Temp. Dialysis, [ ]  Permanent dialysis Leg ischemia: No, no Surgery needed, [ ]  Yes, Surgery needed, [ ]  Amputation Bowel ischemia: No, [ ]  Medical Rx, [ ]  Surgical Rx Wound complication: No, [ ]  Superficial separation/infection, [ ]  Return to OR Return to OR: No  Return to OR for bleeding: No Stroke: No, [ ]  Minor, [ ]  Major  Discharge medications: Statin use:  Yes If No: [ ]  For Medical reasons, [ ]  Non-compliant ASA use:  Yes  If No: [ ]  For Medical reasons, [ ]  Non-compliant Plavix use:  No If No: [ ]  For Medical reasons, [ ]  Non-compliant Beta blocker use:  Yes If No: [ ]  For Medical reasons, [ ]  Non-compliant

## 2013-10-11 NOTE — Progress Notes (Signed)
Physical Therapy Treatment Patient Details Name: ROSTON GRUNEWALD MRN: [id number] DOB: 18-Feb-1960 Today's Date: 10/11/2013    History of Present Illness 53 y.o. male admitted to [facility] on 10/07/13 with bil LE claudication and is s/p aortobifemoral bypass grafting on 10/07/13.  Pt with significant PMHx of chronic back and right hip pain, schizophremia, depression, HA, left and right THA, fracture surgery on left fingers 4/5, and mandible fx surgery.      PT Comments    Pt doing well with mobility and no further PT needed.  Ready for dc from PT standpoint.    Follow Up Recommendations  No PT follow up     Equipment Recommendations  None recommended by PT    Recommendations for Other Services       Precautions / Restrictions Precautions Precautions: None    Mobility  Bed Mobility Overal bed mobility: Modified Independent       Supine to sit: Modified independent (Device/Increase time);HOB elevated        Transfers Overall transfer level: Modified independent Equipment used: Crutches Transfers: Sit to/from Stand Sit to Stand: Modified independent (Device/Increase time)            Ambulation/Gait Ambulation/Gait assistance: Modified independent (Device/Increase time) Ambulation Distance (Feet): 190 Feet Assistive device: Crutches;None Gait Pattern/deviations: Step-through pattern;Decreased step length - right;Decreased step length - left;Trunk flexed Gait velocity: decreased Gait velocity interpretation: Below normal speed for age/gender General Gait Details: Intially pt with flexed, guarded gait and pt using crutches. As distance incr pt able to stand fully upright and use no assistive device.   Stairs Stairs: Yes Stairs assistance: Modified independent (Device/Increase time) Stair Management: One rail Right;With crutches Number of Stairs: 5    Wheelchair Mobility    Modified Rankin (Stroke Patients Only)       Balance   Sitting-balance support: No  upper extremity supported;Feet supported Sitting balance-Leahy Scale: Normal     Standing balance support: No upper extremity supported Standing balance-Leahy Scale: Good                      Cognition Arousal/Alertness: Awake/alert Behavior During Therapy: WFL for tasks assessed/performed Overall Cognitive Status: Within Functional Limits for tasks assessed                      Exercises      General Comments        Pertinent Vitals/Pain Pain Assessment: 0-10 Pain Score: 4  Pain Location: abdomen Pain Descriptors / Indicators: Aching Pain Intervention(s): Repositioned;Limited activity within patient's tolerance    Home Living                      Prior Function            PT Goals (current goals can now be found in the care plan section) Progress towards PT goals: Goals met/education completed, patient discharged from PT    Frequency       PT Plan Discharge plan needs to be updated    Co-evaluation             End of Session   Activity Tolerance: Patient tolerated treatment well Patient left: in bed;with call bell/phone within reach     Time: 1203-1213 PT Time Calculation (min): 10 min  Charges:  $Gait Training: 8-22 mins                    G Codes:  Toddy Boyd 10/11/2013, 2:55 PM  [facility] PT [phone]

## 2013-10-11 NOTE — Telephone Encounter (Addendum)
Message copied by Fredrich BirksMILLIKAN, DANA P on Fri Oct 11, 2013 12:43 PM ------      Message from: Lorin MercyMCCHESNEY, MARILYN K      Created: Thu Oct 10, 2013  1:36 PM      Regarding: Schedule                   ----- Message -----         From: Raymond GurneyKimberly A Trinh, PA-C         Sent: 10/10/2013  12:51 PM           To: [facility] Charge Pool            S/p aortobifemoral bypass 10/07/13            F/u with Dr. Darrick PennaFields in two weeks             Thanks      Selena BattenKim ------  10/11/13 @ 12:43pm: spoke with patient, dpm

## 2013-10-11 NOTE — Progress Notes (Signed)
  [facility] Progress Note  10/11/2013 7:19 AM 4 Days Post-Op  Subjective:  Complaining of bilateral hip pain. Had 2 BMs yesterday. Denies n/v with clear liquids. Ambulating. Passing flatus.  Tmax 98.7 BP sys 80s-110s 02 98% RA  Filed Vitals:   10/11/13 0536  BP: 103/71  Pulse: 100  Temp: 98.3 F (36.8 C)  Resp: 20    Physical Exam: Incisions:  Midline abdominal and groin incisions c/d/i Extremities:  2+ DP/PT pulses bilaterally Abdomen: normoactive bowel sounds, soft, no distension, mild diffuse tenderness.   CBC    Component Value Date/Time   WBC 14.2* 10/09/2013 0412   RBC 4.54 10/09/2013 0412   HGB 14.7 10/09/2013 0412   HCT 42.7 10/09/2013 0412   PLT 102* 10/09/2013 0412   MCV 94.1 10/09/2013 0412   MCH 32.4 10/09/2013 0412   MCHC 34.4 10/09/2013 0412   RDW 13.7 10/09/2013 0412   LYMPHSABS 2.2 06/28/2010 1712   MONOABS 1.3* 06/28/2010 1712   EOSABS 0.1 06/28/2010 1712   BASOSABS 0.0 06/28/2010 1712    BMET    Component Value Date/Time   NA 139 10/11/2013 0312   K 4.2 10/11/2013 0312   CL 101 10/11/2013 0312   CO2 25 10/11/2013 0312   GLUCOSE 90 10/11/2013 0312   BUN 15 10/11/2013 0312   CREATININE 1.28 10/11/2013 0312   CALCIUM 9.3 10/11/2013 0312   GFRNONAA 62* 10/11/2013 0312   GFRAA 72* 10/11/2013 0312    INR    Component Value Date/Time   INR 1.13 10/07/2013 1345     Intake/Output Summary (Last 24 hours) at 10/11/13 0719 Last data filed at 10/10/13 2100  Gross per 24 hour  Intake    600 ml  Output    650 ml  Net    -50 ml     Assessment:  53 y.o. male is s/p: Aortobifemoral bypass graft 4 Days Post-Op  Plan: -Doing well on clear liquids. BM x 2 yesterday. Passing flatus. Will start on regular diet.  -Incisions healing well. Easily palpable pedal pulses -Ambulate. -DVT prophylaxis:  SCDs -Dispo: Anticipate d/c tomorrow.    Maris BergerKimberly Trinh, PA-C [facility] Office: [phone] Pager:  [phone] 10/11/2013 7:19 AM   Agree with above D/c home tomorrow morning Ambulate today Advance diet  Fabienne Brunsharles Fields, MD [facility] [place] Office: [phone] Pager: [phone]

## 2013-10-12 LAB — BASIC METABOLIC PANEL
Anion gap: 14 (ref 5–15)
BUN: 15 mg/dL (ref 6–23)
CALCIUM: 9.3 mg/dL (ref 8.4–10.5)
CO2: 26 mEq/L (ref 19–32)
Chloride: 99 mEq/L (ref 96–112)
Creatinine, Ser: 1.27 mg/dL (ref 0.50–1.35)
GFR calc Af Amer: 73 mL/min — ABNORMAL LOW (ref >90)
GFR, EST NON AFRICAN AMERICAN: 63 mL/min — AB (ref >90)
GLUCOSE: 109 mg/dL — AB (ref 70–99)
Potassium: 4.1 mEq/L (ref 3.7–5.3)
SODIUM: 139 meq/L (ref 137–147)

## 2013-10-12 MED ORDER — DOCUSATE SODIUM 100 MG PO CAPS
100.0000 mg | ORAL_CAPSULE | Freq: Two times a day (BID) | ORAL | Status: DC | PRN
Start: 2013-10-12 — End: 2015-11-03

## 2013-10-12 NOTE — Progress Notes (Addendum)
  Progress Note    10/12/2013 7:45 AM 5 Days Post-Op  Subjective:  States he does not get nauseated when he eats, but gets full quickly.  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 Left great toe pain is resolved since surgery.  Afebrile HR 90-100's regular 90's-100's systolic 97% RA  Filed Vitals:   10/12/13 0401  BP: 107/64  Pulse: 103  Temp:   Resp: 18    Physical Exam: Cardiac:  regular Lungs:  CTAB Incisions:  Laparotomy incision is c/d/i with staples in tact.  Healing nicely.  Bilateral groin incisions are healing nicely. Extremities:  Bilateral palpable DP pulses; bilateral feet are warm. Abdomen:  Soft, NT/ND +BS; no BM past 24 hours.  CBC    Component Value Date/Time   WBC 14.2* 10/09/2013 0412   RBC 4.54 10/09/2013 0412   HGB 14.7 10/09/2013 0412   HCT 42.7 10/09/2013 0412   PLT 102* 10/09/2013 0412   MCV 94.1 10/09/2013 0412   MCH 32.4 10/09/2013 0412   MCHC 34.4 10/09/2013 0412   RDW 13.7 10/09/2013 0412   LYMPHSABS 2.2 06/28/2010 1712   MONOABS 1.3* 06/28/2010 1712   EOSABS 0.1 06/28/2010 1712   BASOSABS 0.0 06/28/2010 1712    BMET    Component Value Date/Time   NA 139 10/12/2013 0419   K 4.1 10/12/2013 0419   CL 99 10/12/2013 0419   CO2 26 10/12/2013 0419   GLUCOSE 109* 10/12/2013 0419   BUN 15 10/12/2013 0419   CREATININE 1.27 10/12/2013 0419   CALCIUM 9.3 10/12/2013 0419   GFRNONAA 63* 10/12/2013 0419   GFRAA 73* 10/12/2013 0419    INR    Component Value Date/Time   INR 1.13 10/07/2013 1345     Intake/Output Summary (Last 24 hours) at 10/12/13 0745 Last data filed at 10/11/13 2031  Gross per 24 hour  Intake    840 ml  Output    650 ml  Net    190 ml     Assessment:  53 y.o. male is s/p:  Aortobifemoral bypass graft  5 Days Post-Op  Plan: -pt doing very well this am  -ambulating well  -tolerating diet without N/V, but gets full quickly.  Explained that he will need to go slow and eat what  he can at this point. -DVT prophylaxis:  SCD's -discharge home today -he wants 10mg  Oxy instead of 5mg  two tabs, but explained to him that he will need to wean off these and it will be easier to do this with the 5mg  tabs.  States that he needs the pain meds to walk with his hip pain and disk problems.   Doreatha MassedSamantha Rhyne, PA-C [facility] [phone] 10/12/2013 7:45 AM    Incisions healing Tolerating diet 2+ DP pulses Will d/c home today  Fabienne Brunsharles Fields, MD [facility] [place] Office: [phone] Pager: [phone]

## 2013-10-13 NOTE — Progress Notes (Signed)
Pt/family given discharge instructions, medication lists, follow up appointments, and when to call the doctor.  Pt/family verbalizes understanding. Pt given signs and symptoms of infection. Ianmichael Amescua McClintock, RN    

## 2013-10-15 ENCOUNTER — Encounter (HOSPITAL_COMMUNITY): Payer: Self-pay | Admitting: Emergency Medicine

## 2013-10-15 ENCOUNTER — Emergency Department (HOSPITAL_COMMUNITY)
Admission: EM | Admit: 2013-10-15 | Discharge: 2013-10-15 | Disposition: A | Discharge status: Home / Self Care | Payer: Medicaid Other | Attending: Emergency Medicine | Admitting: Emergency Medicine

## 2013-10-15 ENCOUNTER — Emergency Department (HOSPITAL_COMMUNITY): Payer: Medicaid Other

## 2013-10-15 DIAGNOSIS — Z720 Tobacco use: Secondary | ICD-10-CM | POA: Diagnosis not present

## 2013-10-15 DIAGNOSIS — G8929 Other chronic pain: Secondary | ICD-10-CM | POA: Insufficient documentation

## 2013-10-15 DIAGNOSIS — F329 Major depressive disorder, single episode, unspecified: Secondary | ICD-10-CM | POA: Diagnosis not present

## 2013-10-15 DIAGNOSIS — Z7982 Long term (current) use of aspirin: Secondary | ICD-10-CM | POA: Insufficient documentation

## 2013-10-15 DIAGNOSIS — Z8679 Personal history of other diseases of the circulatory system: Secondary | ICD-10-CM | POA: Insufficient documentation

## 2013-10-15 DIAGNOSIS — M549 Dorsalgia, unspecified: Secondary | ICD-10-CM | POA: Diagnosis not present

## 2013-10-15 DIAGNOSIS — G8918 Other acute postprocedural pain: Secondary | ICD-10-CM | POA: Insufficient documentation

## 2013-10-15 DIAGNOSIS — M1990 Unspecified osteoarthritis, unspecified site: Secondary | ICD-10-CM | POA: Diagnosis not present

## 2013-10-15 DIAGNOSIS — Z8719 Personal history of other diseases of the digestive system: Secondary | ICD-10-CM | POA: Diagnosis not present

## 2013-10-15 DIAGNOSIS — Z79899 Other long term (current) drug therapy: Secondary | ICD-10-CM | POA: Diagnosis not present

## 2013-10-15 DIAGNOSIS — R109 Unspecified abdominal pain: Secondary | ICD-10-CM | POA: Diagnosis present

## 2013-10-15 LAB — CBC WITH DIFFERENTIAL/PLATELET
BASOS ABS: 0 10*3/uL (ref 0.0–0.1)
BASOS PCT: 0 % (ref 0–1)
EOS ABS: 0.1 10*3/uL (ref 0.0–0.7)
EOS PCT: 1 % (ref 0–5)
HCT: 39 % (ref 39.0–52.0)
Hemoglobin: 13.3 g/dL (ref 13.0–17.0)
LYMPHS ABS: 1.9 10*3/uL (ref 0.7–4.0)
Lymphocytes Relative: 25 % (ref 12–46)
MCH: 31.7 pg (ref 26.0–34.0)
MCHC: 34.1 g/dL (ref 30.0–36.0)
MCV: 92.9 fL (ref 78.0–100.0)
Monocytes Absolute: 0.6 10*3/uL (ref 0.1–1.0)
Monocytes Relative: 8 % (ref 3–12)
Neutro Abs: 5.2 10*3/uL (ref 1.7–7.7)
Neutrophils Relative %: 66 % (ref 43–77)
PLATELETS: 247 10*3/uL (ref 150–400)
RBC: 4.2 MIL/uL — ABNORMAL LOW (ref 4.22–5.81)
RDW: 13.6 % (ref 11.5–15.5)
WBC: 7.9 10*3/uL (ref 4.0–10.5)

## 2013-10-15 LAB — COMPREHENSIVE METABOLIC PANEL
ALBUMIN: 3.7 g/dL (ref 3.5–5.2)
ALK PHOS: 61 U/L (ref 39–117)
ALT: 25 U/L (ref 0–53)
AST: 25 U/L (ref 0–37)
Anion gap: 12 (ref 5–15)
BUN: 12 mg/dL (ref 6–23)
CALCIUM: 10.1 mg/dL (ref 8.4–10.5)
CO2: 28 mEq/L (ref 19–32)
Chloride: 97 mEq/L (ref 96–112)
Creatinine, Ser: 1.24 mg/dL (ref 0.50–1.35)
GFR calc Af Amer: 75 mL/min — ABNORMAL LOW (ref >90)
GFR calc non Af Amer: 65 mL/min — ABNORMAL LOW (ref >90)
Glucose, Bld: 111 mg/dL — ABNORMAL HIGH (ref 70–99)
POTASSIUM: 4.7 meq/L (ref 3.7–5.3)
Sodium: 137 mEq/L (ref 137–147)
Total Bilirubin: 0.5 mg/dL (ref 0.3–1.2)
Total Protein: 7.8 g/dL (ref 6.0–8.3)

## 2013-10-15 LAB — I-STAT CHEM 8, ED
BUN: 12 mg/dL (ref 6–23)
CHLORIDE: 100 meq/L (ref 96–112)
Calcium, Ion: 1.19 mmol/L (ref 1.12–1.23)
Creatinine, Ser: 1.3 mg/dL (ref 0.50–1.35)
Glucose, Bld: 114 mg/dL — ABNORMAL HIGH (ref 70–99)
HEMATOCRIT: 41 % (ref 39.0–52.0)
Hemoglobin: 13.9 g/dL (ref 13.0–17.0)
POTASSIUM: 4.6 meq/L (ref 3.7–5.3)
SODIUM: 138 meq/L (ref 137–147)
TCO2: 28 mmol/L (ref 0–100)

## 2013-10-15 LAB — CK: CK TOTAL: 88 U/L (ref 7–232)

## 2013-10-15 LAB — I-STAT CG4 LACTIC ACID, ED: LACTIC ACID, VENOUS: 1.34 mmol/L (ref 0.5–2.2)

## 2013-10-15 LAB — LIPASE, BLOOD: Lipase: 24 U/L (ref 11–59)

## 2013-10-15 LAB — TROPONIN I: Troponin I: <0.3 ng/mL (ref <0.30)

## 2013-10-15 MED ORDER — HYDROMORPHONE HCL 1 MG/ML IJ SOLN
1.0000 mg | Freq: Once | INTRAMUSCULAR | Status: AC
  Administered 2013-10-15: 1 mg via INTRAVENOUS
  Filled 2013-10-15: qty 1

## 2013-10-15 MED ORDER — HYDROCODONE-ACETAMINOPHEN 5-325 MG PO TABS: 2.0000 | ORAL_TABLET | ORAL | Status: DC | PRN

## 2013-10-15 MED ORDER — IOHEXOL 350 MG/ML SOLN
100.0000 mL | Freq: Once | INTRAVENOUS | Status: AC | PRN
  Administered 2013-10-15: 100 mL via INTRAVENOUS

## 2013-10-15 MED ORDER — ONDANSETRON HCL 4 MG/2ML IJ SOLN
4.0000 mg | Freq: Once | INTRAMUSCULAR | Status: AC
  Administered 2013-10-15: 4 mg via INTRAVENOUS
  Filled 2013-10-15: qty 2

## 2013-10-15 NOTE — ED Notes (Signed)
MD aware pt in pain. Still no orders.

## 2013-10-15 NOTE — ED Notes (Signed)
Md aware pt request pain medication

## 2013-10-15 NOTE — Discharge Instructions (Signed)
Pain Relief Preoperatively and Postoperatively °Being a good patient does not mean being a silent one. If you have questions, problems, or concerns about the pain you may feel after surgery, let your caregiver know. Patients have the right to assessment and management of pain. The treatment of pain after surgery is important to speed up recovery and return to normal activities. Severe pain after surgery, and the fear or anxiety associated with that pain, may cause extreme discomfort that: °· Prevents sleep. °· Decreases the ability to breathe deeply and cough. This can cause pneumonia or other upper airway infections. °· Causes your heart to beat faster and your blood pressure to be higher. °· Increases the risk for constipation and bloating. °· Decreases the ability of wounds to heal. °· May result in depression, increased anxiety, and feelings of helplessness. °Relief of pain before surgery is also important because it will lessen the pain after surgery. Patients who receive both pain relief before and after surgery experience greater pain relief than those who only receive pain relief after surgery. Let your caregiver know if you are having uncontrolled pain. This is very important. Pain after surgery is more difficult to manage if it is permitted to become severe, so prompt and adequate treatment of acute pain is necessary. °PAIN CONTROL METHODS °Your caregivers follow policies and procedures about the management of patient pain. These guidelines should be explained to you before surgery. Plans for pain control after surgery must be mutually decided upon and instituted with your full understanding and agreement. Do not be afraid to ask questions regarding the care you are receiving. There are many different ways your caregivers will attempt to control your pain, including the following methods. °As needed pain control °· You may be given pain medicine either through your intravenous (IV) tube, or as a pill or  liquid you can swallow. You will need to let your caregiver know when you are having pain. Then, your caregiver will give you the pain medicine ordered for you. °· Your pain medicine may make you constipated. If constipation occurs, drink more liquids if you can. Your caregiver may have you take a mild laxative. °IV patient-controlled analgesia pump (PCA pump) °· You can get your pain medicine through the IV tube which goes into your vein. You are able to control the amount of pain medicine that you get. The pain medicine flows in through an IV tube and is controlled by a pump. This pump gives you a set amount of pain medicine when you push the button hooked up to it. Nobody should push this button but you or someone specifically assigned by you to do so. It is set up to keep you from accidentally giving yourself too much pain medicine. You will be able to start using your pain pump in the recovery room after your surgery. This method can be helpful for most types of surgery. °· If you are still having too much pain, tell your caregiver. Also, tell your caregiver if you are feeling too sleepy or nauseous. °Continuous epidural pain control °· A thin, soft tube (catheter) is put into your back. Pain medicine flows through the catheter to lessen pain in the part of your body where the surgery is done. Continuous epidural pain control may work best for you if you are having surgery on your chest, abdomen, hip area, or legs. The epidural catheter is usually put into your back just before surgery. The catheter is left in until you can eat and take medicine by mouth. In most cases,   this may take 2 to 3 days. °· Giving pain medicine through the epidural catheter may help you heal faster because: °¨ Your bowel gets back to normal faster. °¨ You can get back to eating sooner. °¨ You can be up and walking sooner. °Medicine that numbs the area (local anesthetic) °· You may receive an injection of pain medicine near where the  pain is (local infiltration). °· You may receive an injection of pain medicine near the nerve that controls the sensation to a specific part of the body (peripheral nerve block). °· Medicine may be put in the spine to block pain (spinal block). °Opioids °· Moderate to moderately severe acute pain after surgery may respond to opioids. Opioids are narcotic pain medicine. Opioids are often combined with non-narcotic medicines to improve pain relief, diminish the risk of side effects, and reduce the chance of addiction. °· If you follow your caregiver's directions about taking opioids and you do not have a history of substance abuse, your risk of becoming addicted is exceptionally small. Opioids are given for short periods of time in careful doses to prevent addiction. °Other methods of pain control include: °· Steroids. °· Physical therapy. °· Heat and cold therapy. °· Compression, such as wrapping an elastic bandage around the area of pain. °· Massage. °These various ways of controlling pain may be used together. Combining different methods of pain control is called multimodal analgesia. Using this approach has many benefits, including being able to eat, move around, and leave the hospital sooner. °Document Released: 03/19/2002 Document Revised: 03/21/2011 Document Reviewed: 03/23/2010 °[employer]® Patient Information ©2015 [employer]. This information is not intended to replace advice given to you by your health care provider. Make sure you discuss any questions you have with your health care provider. ° °

## 2013-10-15 NOTE — ED Provider Notes (Signed)
CSN: [id number]     Arrival date & time 10/15/13  4098 History   First MD Initiated Contact with Patient 10/15/13 [phone]     Chief Complaint  Patient presents with  . Abdominal Pain  . Flank Pain     (Consider location/radiation/quality/duration/timing/severity/associated sxs/prior Treatment) HPI Comments: Patient presents to the ER for evaluation of abdominal pain, back and flank pain, bilateral hip pain. Patient reports undergoing aortofemoral bypass on September 28. He had been doing well recovering from surgery. Patient reports increasing pain yesterday. Patient reports severe, constant pain in the lower abdomen region, into his back bilaterally and down into the hips. He has had some tingling in the left leg.  Patient is a 53 y.o. male presenting with abdominal pain and flank pain.  Abdominal Pain Flank Pain Associated symptoms include abdominal pain.    Past Medical History  Diagnosis Date  . Chronic back pain     and right hip  . GERD (gastroesophageal reflux disease)     occ tums  . Arthritis   . Hip pain   . Schizophrenia   . Bipolar affective   . 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 Depression   . Headache(784.0)     migraines   Past Surgical History  Procedure Laterality Date  . Mandible fracture surgery    . Esophagogastroduodenoscopy  09/06/2011    Procedure: ESOPHAGOGASTRODUODENOSCOPY (EGD);  Surgeon: Vertell Novak., MD;  Location: [facility];  Service: Endoscopy;  Laterality: N/A;  . Savory dilation  09/06/2011    Procedure: SAVORY DILATION;  Surgeon: Vertell Novak., MD;  Location: [facility];  Service: Endoscopy;  Laterality: N/A;  . Esophageal dilation    . Total hip arthroplasty Left 04/17/2012    Procedure: LEFT TOTAL HIP ARTHROPLASTY ANTERIOR APPROACH;  Surgeon: Kathryne Hitch, MD;  Location: [facility] OR;  Service: Orthopedics;  Laterality: Left;  . Joint replacement      rt hip  replacement, and  LT hip  . Fracture surgery      left fingers # 4,5  . Aorta - bilateral femoral artery bypass graft N/A 10/07/2013    Procedure: AORTA BIFEMORAL BYPASS GRAFT;  Surgeon: Sherren Kerns, MD;  Location: [facility] OR;  Service: Vascular;  Laterality: N/A;   Family History  Problem Relation Age of Onset  . Diabetes Mother   . Diabetes Father   . Diabetes Other    History  Substance Use Topics  . Smoking status: Current Every Day Smoker -- 0.25 packs/day for 34 years    Types: Cigarettes  . Smokeless tobacco: Never Used  . Alcohol Use: Yes     Comment: once a week     Review of Systems  Gastrointestinal: Positive for abdominal pain.  Genitourinary: Positive for flank pain.  Musculoskeletal: Positive for back pain.  All other systems reviewed and are negative.     Allergies  Ibuprofen and Tramadol  Home Medications   Prior to Admission medications   Medication Sig Start Date End Date Taking? Authorizing Provider  aspirin EC 325 MG tablet Take 1 tablet (325 mg total) by mouth 2 (two) times daily. 04/19/12  Yes Richardean Canal, PA-C  atorvastatin (LIPITOR) 80 MG tablet Take 80 mg by mouth daily.   Yes Historical Provider, MD  carvedilol (COREG) 6.25 MG tablet Take 6.25 mg by mouth 2 (two) times daily with a meal.   Yes Historical Provider, MD  docusate  sodium (COLACE) 100 MG capsule Take 1 capsule (100 mg total) by mouth 2 (two) times daily as needed for mild constipation. 10/12/13  Yes Samantha J Rhyne, PA-C  ferrous sulfate 325 (65 FE) MG tablet Take 1 tablet (325 mg total) by mouth 3 (three) times daily after meals. 04/19/12  Yes Richardean CanalGilbert Clark, PA-C  HYDROcodone-acetaminophen (NORCO/VICODIN) 5-325 MG per tablet Take 1-2 tablets by mouth every 6 hours as needed for pain. 10/10/13  Yes Raymond GurneyKimberly A Trinh, PA-C  magnesium hydroxide (MILK OF MAGNESIA) 400 MG/5ML suspension Take 30 mLs by mouth daily as needed for mild constipation.   Yes Historical Provider, MD  meloxicam  (MOBIC) 7.5 MG tablet Take 7.5 mg by mouth daily.   Yes Historical Provider, MD  tiZANidine (ZANAFLEX) 4 MG capsule Take 4 mg by mouth as needed for muscle spasms.   Yes Historical Provider, MD  traZODone (DESYREL) 100 MG tablet Take 100 mg by mouth at bedtime.   Yes Historical Provider, MD   BP 108/77  Pulse 76  Temp(Src) 97.8 F (36.6 C)  Resp 19  Ht 5\' 10"  (1.778 m)  Wt 140 lb (63.504 kg)  BMI 20.09 kg/m2  SpO2 100% Physical Exam  Constitutional: He is oriented to person, place, and time. He appears well-developed and well-nourished. No distress.  HENT:  Head: Normocephalic and atraumatic.  Right Ear: Hearing normal.  Left Ear: Hearing normal.  Nose: Nose normal.  Mouth/Throat: Oropharynx is clear and moist and mucous membranes are normal.  Eyes: Conjunctivae and EOM are normal. Pupils are equal, round, and reactive to light.  Neck: Normal range of motion. Neck supple.  Cardiovascular: Regular rhythm, S1 normal and S2 normal.  Exam reveals no gallop and no friction rub.   No murmur heard. Pulses:      Dorsalis pedis pulses are 1+ on the right side, and 1+ on the left side.  Pulmonary/Chest: Effort normal and breath sounds normal. No respiratory distress. He exhibits no tenderness.  Abdominal: Soft. Normal appearance and bowel sounds are normal. He exhibits no distension. There is no hepatosplenomegaly. There is tenderness (Midline incision with staples intact). There is no rebound, no guarding, no tenderness at McBurney's point and negative Murphy's sign. No hernia.  Musculoskeletal: Normal range of motion.  Neurological: He is alert and oriented to person, place, and time. He has normal strength. No cranial nerve deficit or sensory deficit. Coordination normal. GCS eye subscore is 4. GCS verbal subscore is 5. GCS motor subscore is 6.  Skin: Skin is warm, dry and intact. No rash noted. No cyanosis or erythema.  Midline incision abdomen staples intact, no erythema or drainage   Psychiatric: He has a normal mood and affect. His speech is normal and behavior is normal. Thought content normal.    ED Course  Procedures (including critical care time) Labs Review Labs Reviewed  CBC WITH DIFFERENTIAL - Abnormal; Notable for the following:    RBC 4.20 (*)    All other components within normal limits  COMPREHENSIVE METABOLIC PANEL - Abnormal; Notable for the following:    Glucose, Bld 111 (*)    GFR calc non Af Amer 65 (*)    GFR calc Af Amer 75 (*)    All other components within normal limits  I-STAT CHEM 8, ED - Abnormal; Notable for the following:    Glucose, Bld 114 (*)    All other components within normal limits  LIPASE, BLOOD  TROPONIN I  CK  I-STAT CG4 LACTIC ACID, ED  Imaging Review Ct Cta Abd/pel W/cm &/or W/o Cm  10/15/2013   CLINICAL DATA:  Abdominal pain. Slight pain. Recent aortofemoral bypass.  EXAM: CTA ABDOMEN AND PELVIS WITHOUT AND WITH CONTRAST  TECHNIQUE: Multidetector CT imaging of the abdomen and pelvis was performed using the standard protocol during bolus administration of intravenous contrast. Multiplanar reconstructed images and MIPs were obtained and reviewed to evaluate the vascular anatomy.  CONTRAST:  100 cc Omnipaque 350  COMPARISON:  CT 09/05/2013.  FINDINGS: This is a limited exam due to extensive streak artifact in the pelvis from bilateral hip prostheses. Also there is paucity of intra-abdominal fat making evaluation difficult.  Vascular:  Distal aortic occlusion is present. An aorto bifem graft is patent. The graft is patent. 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 Single major renal arteries are present and are patent. Symmetric renal perfusion. Celiac and SMA patent. The IMA is patent. No evidence of retroperitoneal hemorrhage.  Nonvascular:  Liver, spleen, pancreas, biliary system unremarkable.  Adrenals are unremarkable. Simple right renal cyst. Kidneys are otherwise unremarkable. No  hydronephrosis. Bladder and pelvis difficult to visualize due to streak artifact from hip prostheses.  No significant adenopathy.  Fluid-filled colon is noted. Diarrheal illness cannot be excluded. Appendiceal region is normal. No evidence of bowel obstruction. No free air.  Mild interstitial prominence most consistent chronic interstitial lung disease. Midline abdominal staples. No significant hernia. No acute bony abnormality. Bilateral hip prostheses.  Review of the MIP images confirms the above findings.  IMPRESSION: 1. Aortobifem graft widely patent. 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 No evidence of retroperitoneal hemorrhage or significant contrast leak. 2. Fluid-filled colon suggestive possibility of diarrheal illness.   Electronically Signed   By: Maisie Fus  Register   On: 10/15/2013 14:37     EKG Interpretation   Date/Time:  Tuesday October 15 2013 10:03:53 EDT Ventricular Rate:  82 PR Interval:  111 QRS Duration: 82 QT Interval:  370 QTC Calculation: 432 R Axis:   81 Text Interpretation:  Sinus rhythm Borderline short PR interval Right  atrial enlargement RSR' in V1 or V2, probably normal variant ST elev,  probable normal early repol pattern No significant change since last  tracing Confirmed by POLLINA  MD, CHRISTOPHER [phone]) on 10/15/2013  10:16:46 AM      MDM   Final diagnoses:  None   postoperative pain  Patient presents to the ER for evaluation of lower abdominal pain. Patient is one-week status post aortobifemoral bypass. The pain is in the lower abdomen, lower back and radiating to the hips. He does not have any acute abdominal signs. Surgical incision appears to be healing well without any signs of infection. Patient has good distal pulses and sensation. CT Angio of abdomen and pelvis was performed. Graft is patent. No other acute pathology. They did make note of ectasia of the aorta component to 2 cm, this was discussed with  Doctor Durwin Nora, call for vascular surgery. He did reviewed the images and felt that the patient was safe for discharge based on the CT findings.   Gilda Crease, MD 10/15/13 1500

## 2013-10-15 NOTE — ED Notes (Signed)
Phlebotomy at bedside.

## 2013-10-15 NOTE — ED Notes (Signed)
Patient returned from CT. Patient has to go to the bathroom. NT Chastity at the bedside helping patient.

## 2013-10-15 NOTE — ED Notes (Signed)
Per EMS- pt d/c from an aortic bypass with hydrocodone. Pt called EMS because his pain to his abdomen radiating to sides are hurting 10/10 and the medication is not helping with the pain.

## 2013-10-16 ENCOUNTER — Telehealth: Payer: Self-pay

## 2013-10-16 DIAGNOSIS — I739 Peripheral vascular disease, unspecified: Secondary | ICD-10-CM

## 2013-10-16 DIAGNOSIS — Z48812 Encounter for surgical aftercare following surgery on the circulatory system: Secondary | ICD-10-CM

## 2013-10-16 MED ORDER — OXYCODONE-ACETAMINOPHEN 10-325 MG PO TABS: 1.0000 | ORAL_TABLET | ORAL | Status: DC | PRN

## 2013-10-16 NOTE — Telephone Encounter (Signed)
Pt. notified per phone of Rx for Percocet as prescribed by Dr. Darrick PennaFields; written Rx will be avail. to pick-up at front desk.  Advised pt. that per recommendation of Dr. Darrick PennaFields, no further pain medication will be filled.  Verb. Understanding.

## 2013-10-16 NOTE — Telephone Encounter (Signed)
RE: pain medication request Received: Today     James Kernsharles E Fields, MD [name], RN            He has had 50 tabs of Vicodin in the last 5 days. Give him a one time only Rx for Percocet 10/325 dispense 30 NO Further pain meds and emphasize to him. Please see if Thayer OhmChris or Rosalita ChessmanSuzanne can sign it.   Thanks   James Stevens

## 2013-10-16 NOTE — Telephone Encounter (Signed)
Phone call from pt.  Stated he was in the ER yesterday, and "everything checked out."  Reported they gave him Hydrocodone for pain, "but it isn't strong enough."  Reported that Dr. Darrick PennaFields wants him to walk; stated he has so much pain in his abdominal incision, back and hips, that he can't walk very far.  Rated pain level at 10/10.  Reported that if he lays down, the pain level goes to 7/10.  Stated he hasn't been doing much walking, due to the amt. Of pain.  Encouraged pt. To start slow with activity and gradually add to the length of time he walks; ie: walk 5-7 min. 3-4 times/day and slowly increase, as he tolerates; also encouraged to sit in chair, at intervals, during day, and at meals.  Advised that will discuss level of pain with Dr. Darrick PennaFields, and return call with recommendations.

## 2013-10-25 ENCOUNTER — Encounter (HOSPITAL_COMMUNITY): Payer: Self-pay | Admitting: Vascular Surgery

## 2013-10-25 NOTE — Addendum Note (Signed)
Addendum created 10/25/13 1726 by Judie Petitharlene Edwards, MD   Modules edited: Anesthesia Events

## 2013-10-29 ENCOUNTER — Encounter: Payer: Self-pay | Admitting: Vascular Surgery

## 2013-10-30 ENCOUNTER — Ambulatory Visit (INDEPENDENT_AMBULATORY_CARE_PROVIDER_SITE_OTHER): Payer: Medicaid Other | Admitting: Vascular Surgery

## 2013-10-30 ENCOUNTER — Encounter: Payer: Self-pay | Admitting: Vascular Surgery

## 2013-10-30 VITALS — BP 103/71 | HR 86 | Resp 16 | Ht 70.0 in | Wt 140.0 lb

## 2013-10-30 DIAGNOSIS — Z48812 Encounter for surgical aftercare following surgery on the circulatory system: Secondary | ICD-10-CM

## 2013-10-30 DIAGNOSIS — I739 Peripheral vascular disease, unspecified: Secondary | ICD-10-CM

## 2013-10-30 MED ORDER — OXYCODONE-ACETAMINOPHEN 5-325 MG PO TABS: 1.0000 | ORAL_TABLET | ORAL | Status: DC | PRN

## 2013-10-30 NOTE — Progress Notes (Signed)
Patient is a 53 year old male who returns today for postoperative followup after aortobifemoral bypass.  He states he still has some soreness in his lower abdomen and in his legs. Some of his left foot pain sounds like neuropathy at this point. He describes burning and stinging sensation. He states that he is not smoking. He denies claudication symptoms but has not really walked that much.  Physical exam:  Filed Vitals:   10/30/13 0913  BP: 103/71  Pulse: 86  Resp: 16  Height: 5\' 10"  (1.778 m)  Weight: 140 lb (63.504 kg)    Abdomen: Soft minimal tenderness staples intact no evidence of hernia  Extremities: 2+ femoral pulses well-healed groin incisions 2+ dorsalis pedis posterior tibial pulses bilaterally  Assessment: Doing well status post aortobifemoral bypass.  Plan: The patient was given 30 more Percocet 5/325 tablets he should need no further refills. He will followup in 3 months. We will obtain ABIs at that point. Abdominal staples were removed today.  Fabienne Brunsharles Loray Akard, MD [facility] Office: [phone] Pager: [phone]

## 2013-10-30 NOTE — Addendum Note (Signed)
Addended by: Sharee PimpleMCCHESNEY, Braedyn Riggle K on: 10/30/2013 02:23 PM   Modules accepted: Orders

## 2013-11-04 ENCOUNTER — Telehealth: Payer: Self-pay

## 2013-11-04 NOTE — Telephone Encounter (Signed)
Phone call from pt.  Requested refill on pain medication.  Stated he was told to take it every 4 hrs. PRN, and only had enough for 5 days.  Rated pain at "9/10", when questioned.  Stated he walked on block yesterday, and had trouble with pain in back and hips, and got very tired during the walk.  Also, reported he gets pain just below the navel.  Upon further questioning, stated he can't eat very much because he gets full very quickly, and has bloating of the abdomen.  Reported having nl. bowel movements.  Advised pt. That per Dr. Darrick PennaFields note of 10/21, he was not to receive any more narcotic pain medication.  Stated that he has tried Advil, Motrin, and Aleve OTC, but "it doesn't help." Advised that at 1 month post-op, the pt. Should not have such severe pain, and he would need to be reevaluated prior to giving further pain medication.  Advised he should contact his PCP re: the abdominal bloating and full feeling, assoc. with eating.  Pt. Is requesting to get appt. With Dr. Darrick PennaFields.  Advised will discuss with Dr. Darrick PennaFields, and contact pt. With further recommendations.

## 2013-11-04 NOTE — Telephone Encounter (Signed)
Rec'd. response from Dr. Darrick PennaFields.  Stated no pain medication refill at this time.  Called pt.  Advised we cannot refill pain medication per Dr. Darrick PennaFields recommendation.  Offered to schedule appt. with Dr. Darrick PennaFields for further evaluation.  Pt. Stated he has an appt. With his PCP this week, and will talk to him about his pain.  Advised to call back if needed to make another appt. with Dr. Darrick PennaFields.  Verb. Understanding.

## 2013-11-28 ENCOUNTER — Institutional Professional Consult (permissible substitution): Payer: Medicaid Other | Admitting: Cardiovascular Disease

## 2013-12-22 ENCOUNTER — Emergency Department (HOSPITAL_COMMUNITY)
Admission: EM | Admit: 2013-12-22 | Discharge: 2013-12-22 | Disposition: A | Discharge status: Home / Self Care | Payer: Medicaid Other | Attending: Emergency Medicine | Admitting: Emergency Medicine

## 2013-12-22 ENCOUNTER — Encounter (HOSPITAL_COMMUNITY): Payer: Self-pay | Admitting: Family Medicine

## 2013-12-22 ENCOUNTER — Emergency Department (HOSPITAL_COMMUNITY): Payer: Medicaid Other

## 2013-12-22 DIAGNOSIS — M545 Low back pain: Secondary | ICD-10-CM | POA: Insufficient documentation

## 2013-12-22 DIAGNOSIS — Z79899 Other long term (current) drug therapy: Secondary | ICD-10-CM | POA: Diagnosis not present

## 2013-12-22 DIAGNOSIS — G8929 Other chronic pain: Secondary | ICD-10-CM | POA: Diagnosis not present

## 2013-12-22 DIAGNOSIS — Z791 Long term (current) use of non-steroidal anti-inflammatories (NSAID): Secondary | ICD-10-CM | POA: Insufficient documentation

## 2013-12-22 DIAGNOSIS — F319 Bipolar disorder, unspecified: Secondary | ICD-10-CM | POA: Diagnosis not present

## 2013-12-22 DIAGNOSIS — Z7982 Long term (current) use of aspirin: Secondary | ICD-10-CM | POA: Insufficient documentation

## 2013-12-22 DIAGNOSIS — R109 Unspecified abdominal pain: Secondary | ICD-10-CM | POA: Diagnosis present

## 2013-12-22 DIAGNOSIS — Z720 Tobacco use: Secondary | ICD-10-CM | POA: Insufficient documentation

## 2013-12-22 DIAGNOSIS — Z8719 Personal history of other diseases of the digestive system: Secondary | ICD-10-CM | POA: Insufficient documentation

## 2013-12-22 DIAGNOSIS — R1084 Generalized abdominal pain: Secondary | ICD-10-CM

## 2013-12-22 DIAGNOSIS — F209 Schizophrenia, unspecified: Secondary | ICD-10-CM | POA: Insufficient documentation

## 2013-12-22 DIAGNOSIS — S3500XA Unspecified injury of abdominal aorta, initial encounter: Secondary | ICD-10-CM

## 2013-12-22 DIAGNOSIS — M1990 Unspecified osteoarthritis, unspecified site: Secondary | ICD-10-CM | POA: Diagnosis not present

## 2013-12-22 LAB — I-STAT CHEM 8, ED
BUN: 12 mg/dL (ref 6–23)
CALCIUM ION: 1.1 mmol/L — AB (ref 1.12–1.23)
Chloride: 104 mEq/L (ref 96–112)
Creatinine, Ser: 1.3 mg/dL (ref 0.50–1.35)
Glucose, Bld: 70 mg/dL (ref 70–99)
HCT: 43 % (ref 39.0–52.0)
HEMOGLOBIN: 14.6 g/dL (ref 13.0–17.0)
POTASSIUM: 4.5 meq/L (ref 3.7–5.3)
Sodium: 139 mEq/L (ref 137–147)
TCO2: 24 mmol/L (ref 0–100)

## 2013-12-22 LAB — CBC
HEMATOCRIT: 39.3 % (ref 39.0–52.0)
Hemoglobin: 13.1 g/dL (ref 13.0–17.0)
MCH: 32.1 pg (ref 26.0–34.0)
MCHC: 33.3 g/dL (ref 30.0–36.0)
MCV: 96.3 fL (ref 78.0–100.0)
PLATELETS: 158 10*3/uL (ref 150–400)
RBC: 4.08 MIL/uL — AB (ref 4.22–5.81)
RDW: 14.4 % (ref 11.5–15.5)
WBC: 9.7 10*3/uL (ref 4.0–10.5)

## 2013-12-22 LAB — URINALYSIS, ROUTINE W REFLEX MICROSCOPIC
Glucose, UA: NEGATIVE mg/dL
Hgb urine dipstick: NEGATIVE
KETONES UR: NEGATIVE mg/dL
Leukocytes, UA: NEGATIVE
NITRITE: NEGATIVE
Protein, ur: NEGATIVE mg/dL
SPECIFIC GRAVITY, URINE: 1.013 (ref 1.005–1.030)
UROBILINOGEN UA: 0.2 mg/dL (ref 0.0–1.0)
pH: 6 (ref 5.0–8.0)

## 2013-12-22 MED ORDER — IOHEXOL 350 MG/ML SOLN
100.0000 mL | Freq: Once | INTRAVENOUS | Status: AC | PRN
  Administered 2013-12-22: 100 mL via INTRAVENOUS

## 2013-12-22 MED ORDER — HYDROCODONE-ACETAMINOPHEN 5-325 MG PO TABS: 2.0000 | ORAL_TABLET | ORAL | Status: DC | PRN

## 2013-12-22 MED ORDER — HYDROMORPHONE HCL 1 MG/ML IJ SOLN
1.0000 mg | Freq: Once | INTRAMUSCULAR | Status: AC
  Administered 2013-12-22: 1 mg via INTRAVENOUS
  Filled 2013-12-22: qty 1

## 2013-12-22 MED ORDER — SODIUM CHLORIDE 0.9 % IV BOLUS (SEPSIS)
1000.0000 mL | Freq: Once | INTRAVENOUS | Status: AC
  Administered 2013-12-22: 1000 mL via INTRAVENOUS
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
## 2013-12-22 NOTE — Discharge Instructions (Signed)

## 2013-12-22 NOTE — ED Notes (Signed)
Pt returned to room  

## 2013-12-22 NOTE — ED Notes (Signed)
Per pt sts having lower abdominal pain and hematuria. Denies N,V,D.

## 2013-12-30 ENCOUNTER — Ambulatory Visit (INDEPENDENT_AMBULATORY_CARE_PROVIDER_SITE_OTHER): Payer: Self-pay | Admitting: Neurology

## 2013-12-30 ENCOUNTER — Ambulatory Visit (INDEPENDENT_AMBULATORY_CARE_PROVIDER_SITE_OTHER): Payer: Medicaid Other | Admitting: Neurology

## 2013-12-30 DIAGNOSIS — Z0289 Encounter for other administrative examinations: Secondary | ICD-10-CM

## 2013-12-30 DIAGNOSIS — M5410 Radiculopathy, site unspecified: Secondary | ICD-10-CM

## 2013-12-30 DIAGNOSIS — M5416 Radiculopathy, lumbar region: Secondary | ICD-10-CM

## 2013-12-30 NOTE — Progress Notes (Signed)
    [facility]    Provider:  Dr Lucia GaskinsAhern Referring Provider: Dorrene GermanAvbuere, Edwin A, MD Primary Care Physician:  Dorrene GermanAVBUERE,EDWIN A, MD  History:  James AnchorsDaniel J Marcon is a 53 y.o. male here as a referral from Dr. Concepcion ElkAvbuere for evaluation of left lumbosacral radiculopathy.  Patient endorses low back pain for years. Has numbness in the left lateral thigh and hip area. If he sits too long,pain radiates down the left lateral thigh to the left knee and big toe will get numb. He has sharp pains into the left buttocks. His legs ache when walking and sometimes has to stand for a few minutes and then resume walking. No cramping. Right leg is mostly unaffected. Denies weakness. Focused exam shows left patellar DTR < right patellar DTR, 1+ achilles bilat, lower extremity strength 5/5.  Summary: Nerve conduction studies were performed on the bilateral lower extremities.  Bilateral Peroneal and Tibial motor conductions were within normal limits with normal F Wave latencies.  Bilateral Sural and Peroneal sensory conductions were within normal limits Bilateral H Reflexes were within normal limits  EMG needle study was performed on selected left extremity muscles and bilateral paraspinal muscles. The left S1 paraspinal muscle showed moderately increased spontaneous activity. Patient declined emg needle study of the left Gluteus Medius muscle. The left Vastus Medialis, left Anterior Tibialis, left Medial Gastrocnemius, left Biceps Femoris (long and short heads), left Gluteus Maximus muscles were within normal limits. The right L5/S1 and left Left L5 paraspinal muscles were within normal limits.   Conclusion: This is an abnormal study.  The left S1 paraspinal spontaneous activity along with normal sensory conductions are consistent with a left lower-extremity radiculopathy.  Given the lack of similar changes in limb muscles and considerable overlap in paraspinal innervation, a more precise localization cannot  be identified at this time. Clinical correlation recommended.     Naomie DeanAntonia Ahern, MD  [facility] [address] [place], [place] [address]  Phone [phone] Fax [phone]

## 2013-12-30 NOTE — Progress Notes (Signed)
[facility]    Provider:  Dr Lucia GaskinsAhern Referring Provider: Dorrene GermanAvbuere, Edwin A, MD Primary Care Physician:  Dorrene GermanAVBUERE,EDWIN A, MD  History:  James Stevens is a 53 y.o. male here as a referral from Dr. Concepcion ElkAvbuere for evaluation of left lumbosacral radiculopathy.  Patient endorses low back pain for years. Has numbness in the left lateral thigh and hip area. If he sits too long,pain radiates down the left lateral thigh to the left knee and big toe will get numb. He has sharp pains into the left buttocks. His legs ache when walking and sometimes has to stand for a few minutes and then resume walking. No cramping. Right leg is mostly unaffected. Denies weakness. Focused exam shows left patellar DTR < right patellar DTR, 1+ achilles bilat, lower extremity strength 5/5.  Summary: Nerve conduction studies were performed on the bilateral lower extremities.  Bilateral Peroneal and Tibial motor conductions were within normal limits with normal F Wave latencies.  Bilateral Sural and Peroneal sensory conductions were within normal limits Bilateral H Reflexes were within normal limits  EMG needle study was performed on selected left extremity muscles and bilateral paraspinal muscles. The left S1 paraspinal muscle showed moderately increased spontaneous activity. Patient declined emg needle study of the left Gluteus Medius muscle. The left Vastus Medialis, left Anterior Tibialis, left Medial Gastrocnemius, left Biceps Femoris (long and short heads), left Gluteus Maximus muscles were within normal limits. The right L5/S1 and left Left L5 paraspinal muscles were within normal limits.   Conclusion: This is an abnormal study.  The left S1 paraspinal spontaneous activity along with normal sensory conductions are consistent with a left lower-extremity radiculopathy.  Given the lack of similar changes in limb muscles and considerable overlap in paraspinal innervation, a more precise localization cannot be  identified at this time. Clinical correlation recommended.     Naomie DeanAntonia Carolyna Yerian, MD  [facility] [address] [place], [place] [address]  Phone [phone] Fax [phone]

## 2014-01-23 ENCOUNTER — Encounter (HOSPITAL_COMMUNITY): Payer: Self-pay | Admitting: Vascular Surgery

## 2014-01-29 ENCOUNTER — Encounter: Payer: Self-pay | Admitting: Vascular Surgery

## 2014-01-30 ENCOUNTER — Ambulatory Visit (INDEPENDENT_AMBULATORY_CARE_PROVIDER_SITE_OTHER): Payer: Medicaid Other | Admitting: Vascular Surgery

## 2014-01-30 ENCOUNTER — Encounter: Payer: Self-pay | Admitting: Vascular Surgery

## 2014-01-30 ENCOUNTER — Encounter (HOSPITAL_COMMUNITY): Payer: Medicaid Other

## 2014-01-30 VITALS — BP 124/88 | HR 92 | Ht 70.0 in | Wt 144.0 lb

## 2014-01-30 DIAGNOSIS — I739 Peripheral vascular disease, unspecified: Secondary | ICD-10-CM
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
## 2014-01-30 NOTE — Addendum Note (Signed)
Addended by: Sharee PimpleMCCHESNEY, MARILYN K on: 01/30/2014 04:34 PM   Modules accepted: Orders

## 2014-03-13 ENCOUNTER — Other Ambulatory Visit: Payer: Self-pay | Admitting: Specialist

## 2014-03-13 DIAGNOSIS — M545 Low back pain: Secondary | ICD-10-CM

## 2014-03-25 ENCOUNTER — Ambulatory Visit
Admission: RE | Admit: 2014-03-25 | Discharge: 2014-03-25 | Disposition: A | Discharge status: Home / Self Care | Payer: Medicaid Other | Source: Ambulatory Visit | Attending: Specialist | Admitting: Specialist

## 2014-03-25 DIAGNOSIS — M545 Low back pain: Secondary | ICD-10-CM

## 2014-04-30 ENCOUNTER — Other Ambulatory Visit (HOSPITAL_COMMUNITY): Payer: Self-pay | Admitting: Specialist

## 2014-05-02 ENCOUNTER — Encounter (HOSPITAL_COMMUNITY): Payer: Self-pay | Admitting: *Deleted

## 2014-05-02 NOTE — Progress Notes (Addendum)
James James Stevens reports that he is not taking Coreg anymore, "I stop taking them because they were giving me 6 or 7 pills, that's too many."  I"I take my pain medication- Hydrocodone."  Patient states that he saw Dr Concepcion ElkAvbuere this month. I requested notes and labs and EKG  From Dr Avbuere's office.  James Stevens states that he is not taking Meloxicam or Aspirin, or Advil.  Patient reported that he lasted used cocaine, Wednesday, 04/30/14. "I stopped for surgery."

## 2014-05-03 NOTE — Progress Notes (Signed)
Patient called back and stated he would be here Monday. Buffy, NS notified
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
## 2014-05-04 MED ORDER — CEFAZOLIN SODIUM-DEXTROSE 2-3 GM-% IV SOLR
2.0000 g | INTRAVENOUS | Status: AC
  Administered 2014-05-05: 2 g via INTRAVENOUS

## 2014-05-04 MED ORDER — CHLORHEXIDINE GLUCONATE 4 % EX LIQD
60.0000 mL | Freq: Once | CUTANEOUS | Status: DC
  Filled 2014-05-04: qty 60

## 2014-05-05 ENCOUNTER — Inpatient Hospital Stay (HOSPITAL_COMMUNITY): Payer: Medicaid Other

## 2014-05-05 ENCOUNTER — Encounter (HOSPITAL_COMMUNITY): Payer: Self-pay | Admitting: *Deleted

## 2014-05-05 ENCOUNTER — Inpatient Hospital Stay (HOSPITAL_COMMUNITY): Payer: Medicaid Other | Admitting: Anesthesiology

## 2014-05-05 ENCOUNTER — Encounter (HOSPITAL_COMMUNITY)
Admission: RE | Disposition: A | Discharge status: Home / Self Care | Payer: Medicaid Other | Source: Ambulatory Visit | Attending: Specialist

## 2014-05-05 ENCOUNTER — Inpatient Hospital Stay (HOSPITAL_COMMUNITY)
Admission: RE | Admit: 2014-05-05 | Discharge: 2014-05-06 | DRG: 520 | Disposition: A | Discharge status: Home / Self Care | Payer: Medicaid Other | Source: Ambulatory Visit | Attending: Specialist | Admitting: Specialist

## 2014-05-05 DIAGNOSIS — M1990 Unspecified osteoarthritis, unspecified site: Secondary | ICD-10-CM | POA: Diagnosis present

## 2014-05-05 DIAGNOSIS — G4730 Sleep apnea, unspecified: Secondary | ICD-10-CM | POA: Diagnosis present

## 2014-05-05 DIAGNOSIS — M2578 Osteophyte, vertebrae: Secondary | ICD-10-CM | POA: Diagnosis present

## 2014-05-05 DIAGNOSIS — F209 Schizophrenia, unspecified: Secondary | ICD-10-CM | POA: Diagnosis present

## 2014-05-05 DIAGNOSIS — F17210 Nicotine dependence, cigarettes, uncomplicated: Secondary | ICD-10-CM | POA: Diagnosis present

## 2014-05-05 DIAGNOSIS — F329 Major depressive disorder, single episode, unspecified: Secondary | ICD-10-CM | POA: Diagnosis present

## 2014-05-05 DIAGNOSIS — F419 Anxiety disorder, unspecified: Secondary | ICD-10-CM | POA: Diagnosis present

## 2014-05-05 DIAGNOSIS — Z419 Encounter for procedure for purposes other than remedying health state, unspecified: Secondary | ICD-10-CM

## 2014-05-05 DIAGNOSIS — M4806 Spinal stenosis, lumbar region: Secondary | ICD-10-CM | POA: Diagnosis present

## 2014-05-05 DIAGNOSIS — M48062 Spinal stenosis, lumbar region with neurogenic claudication: Secondary | ICD-10-CM | POA: Diagnosis present

## 2014-05-05 DIAGNOSIS — M5126 Other intervertebral disc displacement, lumbar region: Secondary | ICD-10-CM | POA: Diagnosis present

## 2014-05-05 DIAGNOSIS — Z96642 Presence of left artificial hip joint: Secondary | ICD-10-CM | POA: Diagnosis present

## 2014-05-05 DIAGNOSIS — M545 Low back pain: Secondary | ICD-10-CM | POA: Diagnosis present

## 2014-05-05 DIAGNOSIS — K219 Gastro-esophageal reflux disease without esophagitis: Secondary | ICD-10-CM | POA: Diagnosis present

## 2014-05-05 HISTORY — PX: LUMBAR LAMINECTOMY: SHX95

## 2014-05-05 LAB — COMPREHENSIVE METABOLIC PANEL
ALK PHOS: 61 U/L (ref 39–117)
ALT: 38 U/L (ref 0–53)
AST: 29 U/L (ref 0–37)
Albumin: 3.5 g/dL (ref 3.5–5.2)
Anion gap: 10 (ref 5–15)
BUN: 16 mg/dL (ref 6–23)
CHLORIDE: 105 mmol/L (ref 96–112)
CO2: 22 mmol/L (ref 19–32)
Calcium: 9.3 mg/dL (ref 8.4–10.5)
Creatinine, Ser: 1.24 mg/dL (ref 0.50–1.35)
GFR, EST AFRICAN AMERICAN: 75 mL/min — AB (ref >90)
GFR, EST NON AFRICAN AMERICAN: 65 mL/min — AB (ref >90)
Glucose, Bld: 98 mg/dL (ref 70–99)
Potassium: 4 mmol/L (ref 3.5–5.1)
Sodium: 137 mmol/L (ref 135–145)
TOTAL PROTEIN: 6.7 g/dL (ref 6.0–8.3)
Total Bilirubin: 0.4 mg/dL (ref 0.3–1.2)

## 2014-05-05 LAB — CBC
HCT: 42.4 % (ref 39.0–52.0)
Hemoglobin: 14.8 g/dL (ref 13.0–17.0)
MCH: 33 pg (ref 26.0–34.0)
MCHC: 34.9 g/dL (ref 30.0–36.0)
MCV: 94.4 fL (ref 78.0–100.0)
Platelets: 215 10*3/uL (ref 150–400)
RBC: 4.49 MIL/uL (ref 4.22–5.81)
RDW: 14.5 % (ref 11.5–15.5)
WBC: 9.4 10*3/uL (ref 4.0–10.5)

## 2014-05-05 LAB — SURGICAL PCR SCREEN
MRSA, PCR: NEGATIVE
STAPHYLOCOCCUS AUREUS: NEGATIVE

## 2014-05-05 SURGERY — MICRODISCECTOMY LUMBAR LAMINECTOMY
Anesthesia: General | Site: Spine Lumbar

## 2014-05-05 MED ORDER — ROCURONIUM BROMIDE 100 MG/10ML IV SOLN
INTRAVENOUS | Status: DC | PRN
  Administered 2014-05-05: 50 mg via INTRAVENOUS

## 2014-05-05 MED ORDER — DOCUSATE SODIUM 100 MG PO CAPS
100.0000 mg | ORAL_CAPSULE | Freq: Two times a day (BID) | ORAL | Status: DC
  Administered 2014-05-05 – 2014-05-06 (×2): 100 mg via ORAL
  Filled 2014-05-05 (×2): qty 1

## 2014-05-05 MED ORDER — ONDANSETRON HCL 4 MG/2ML IJ SOLN
INTRAMUSCULAR | Status: AC
  Filled 2014-05-05: qty 2

## 2014-05-05 MED ORDER — OXYCODONE HCL 5 MG PO TABS
ORAL_TABLET | ORAL | Status: AC
  Filled 2014-05-05: qty 1

## 2014-05-05 MED ORDER — BISACODYL 10 MG RE SUPP: 10.0000 mg | Freq: Every day | RECTAL | Status: DC | PRN

## 2014-05-05 MED ORDER — OXYCODONE HCL 5 MG PO TABS
5.0000 mg | ORAL_TABLET | Freq: Once | ORAL | Status: AC | PRN
  Administered 2014-05-05: 5 mg via ORAL

## 2014-05-05 MED ORDER — FLEET ENEMA 7-19 GM/118ML RE ENEM: 1.0000 | ENEMA | Freq: Once | RECTAL | Status: AC | PRN

## 2014-05-05 MED ORDER — SODIUM CHLORIDE 0.9 % IJ SOLN: 3.0000 mL | INTRAMUSCULAR | Status: DC | PRN

## 2014-05-05 MED ORDER — HYDROCODONE-ACETAMINOPHEN 5-325 MG PO TABS: 1.0000 | ORAL_TABLET | ORAL | Status: DC | PRN

## 2014-05-05 MED ORDER — LIDOCAINE HCL (CARDIAC) 20 MG/ML IV SOLN
INTRAVENOUS | Status: AC
  Filled 2014-05-05: qty 5

## 2014-05-05 MED ORDER — PHENYLEPHRINE HCL 10 MG/ML IJ SOLN
INTRAMUSCULAR | Status: DC | PRN
  Administered 2014-05-05 (×5): 80 ug via INTRAVENOUS

## 2014-05-05 MED ORDER — HYDROMORPHONE HCL 1 MG/ML IJ SOLN
INTRAMUSCULAR | Status: AC
  Filled 2014-05-05: qty 1

## 2014-05-05 MED ORDER — LIDOCAINE HCL (CARDIAC) 20 MG/ML IV SOLN
INTRAVENOUS | Status: DC | PRN
  Administered 2014-05-05: 100 mg via INTRAVENOUS

## 2014-05-05 MED ORDER — MUPIROCIN 2 % EX OINT
1.0000 "application " | TOPICAL_OINTMENT | Freq: Once | CUTANEOUS | Status: AC
  Administered 2014-05-05: 1 via TOPICAL

## 2014-05-05 MED ORDER — TIZANIDINE HCL 4 MG PO CAPS: 4.0000 mg | ORAL_CAPSULE | ORAL | Status: DC | PRN

## 2014-05-05 MED ORDER — BUPIVACAINE HCL 0.5 % IJ SOLN
INTRAMUSCULAR | Status: DC | PRN
  Administered 2014-05-05: 20 mL

## 2014-05-05 MED ORDER — MENTHOL 3 MG MT LOZG: 1.0000 | LOZENGE | OROMUCOSAL | Status: DC | PRN

## 2014-05-05 MED ORDER — ONDANSETRON HCL 4 MG/2ML IJ SOLN: 4.0000 mg | INTRAMUSCULAR | Status: DC | PRN

## 2014-05-05 MED ORDER — OXYCODONE-ACETAMINOPHEN 5-325 MG PO TABS: 1.0000 | ORAL_TABLET | ORAL | Status: DC | PRN

## 2014-05-05 MED ORDER — SODIUM CHLORIDE 0.9 % IV SOLN: 250.0000 mL | INTRAVENOUS | Status: DC

## 2014-05-05 MED ORDER — OXYCODONE HCL 5 MG/5ML PO SOLN: 5.0000 mg | Freq: Once | ORAL | Status: AC | PRN

## 2014-05-05 MED ORDER — PROMETHAZINE HCL 25 MG/ML IJ SOLN: 6.2500 mg | INTRAMUSCULAR | Status: DC | PRN

## 2014-05-05 MED ORDER — ONDANSETRON HCL 4 MG/2ML IJ SOLN
INTRAMUSCULAR | Status: DC | PRN
  Administered 2014-05-05: 4 mg via INTRAVENOUS

## 2014-05-05 MED ORDER — NEOSTIGMINE METHYLSULFATE 10 MG/10ML IV SOLN
INTRAVENOUS | Status: DC | PRN
  Administered 2014-05-05: 3 mg via INTRAVENOUS

## 2014-05-05 MED ORDER — OXYCODONE-ACETAMINOPHEN 5-325 MG PO TABS
1.0000 | ORAL_TABLET | ORAL | Status: DC | PRN
  Administered 2014-05-05 – 2014-05-06 (×4): 2 via ORAL
  Filled 2014-05-05 (×4): qty 2

## 2014-05-05 MED ORDER — PANTOPRAZOLE SODIUM 40 MG IV SOLR
40.0000 mg | Freq: Every day | INTRAVENOUS | Status: DC
  Administered 2014-05-05: 40 mg via INTRAVENOUS
  Filled 2014-05-05: qty 40

## 2014-05-05 MED ORDER — TIZANIDINE HCL 4 MG PO TABS
4.0000 mg | ORAL_TABLET | ORAL | Status: DC | PRN
  Filled 2014-05-05: qty 1

## 2014-05-05 MED ORDER — BUPIVACAINE LIPOSOME 1.3 % IJ SUSP
INTRAMUSCULAR | Status: DC | PRN
  Administered 2014-05-05: 20 mL

## 2014-05-05 MED ORDER — PHENYLEPHRINE HCL 10 MG/ML IJ SOLN
10.0000 mg | INTRAVENOUS | Status: DC | PRN
  Administered 2014-05-05: 25 ug/min via INTRAVENOUS

## 2014-05-05 MED ORDER — HYDROMORPHONE HCL 1 MG/ML IJ SOLN
0.2500 mg | INTRAMUSCULAR | Status: DC | PRN
  Administered 2014-05-05 (×2): 0.5 mg via INTRAVENOUS

## 2014-05-05 MED ORDER — ROCURONIUM BROMIDE 50 MG/5ML IV SOLN
INTRAVENOUS | Status: AC
  Filled 2014-05-05: qty 1

## 2014-05-05 MED ORDER — CEFAZOLIN SODIUM 1-5 GM-% IV SOLN
1.0000 g | Freq: Three times a day (TID) | INTRAVENOUS | Status: AC
  Administered 2014-05-05 (×2): 1 g via INTRAVENOUS
  Filled 2014-05-05 (×2): qty 50

## 2014-05-05 MED ORDER — POLYETHYLENE GLYCOL 3350 17 G PO PACK: 17.0000 g | PACK | Freq: Every day | ORAL | Status: DC | PRN

## 2014-05-05 MED ORDER — PROPOFOL 10 MG/ML IV BOLUS
INTRAVENOUS | Status: AC
  Filled 2014-05-05: qty 20

## 2014-05-05 MED ORDER — PROPOFOL 10 MG/ML IV BOLUS
INTRAVENOUS | Status: DC | PRN
  Administered 2014-05-05: 160 mg via INTRAVENOUS

## 2014-05-05 MED ORDER — FERROUS SULFATE 325 (65 FE) MG PO TABS
325.0000 mg | ORAL_TABLET | Freq: Three times a day (TID) | ORAL | Status: DC
  Administered 2014-05-05 – 2014-05-06 (×3): 325 mg via ORAL
  Filled 2014-05-05 (×3): qty 1

## 2014-05-05 MED ORDER — THROMBIN 20000 UNITS EX KIT
PACK | CUTANEOUS | Status: DC | PRN
  Administered 2014-05-05: 10:00:00 via TOPICAL

## 2014-05-05 MED ORDER — ASPIRIN EC 325 MG PO TBEC
325.0000 mg | DELAYED_RELEASE_TABLET | Freq: Two times a day (BID) | ORAL | Status: DC
  Administered 2014-05-06: 325 mg via ORAL
  Filled 2014-05-05: qty 1

## 2014-05-05 MED ORDER — MORPHINE SULFATE 2 MG/ML IJ SOLN
1.0000 mg | INTRAMUSCULAR | Status: DC | PRN
  Administered 2014-05-05: 4 mg via INTRAVENOUS
  Filled 2014-05-05: qty 2

## 2014-05-05 MED ORDER — ACETAMINOPHEN 325 MG PO TABS: 650.0000 mg | ORAL_TABLET | ORAL | Status: DC | PRN

## 2014-05-05 MED ORDER — ACETAMINOPHEN 650 MG RE SUPP: 650.0000 mg | RECTAL | Status: DC | PRN

## 2014-05-05 MED ORDER — FENTANYL CITRATE (PF) 250 MCG/5ML IJ SOLN
INTRAMUSCULAR | Status: AC
  Filled 2014-05-05: qty 5

## 2014-05-05 MED ORDER — BUPIVACAINE LIPOSOME 1.3 % IJ SUSP
20.0000 mL | INTRAMUSCULAR | Status: DC
Start: 2014-05-05 — End: 2014-05-05
  Filled 2014-05-05 (×2): qty 20

## 2014-05-05 MED ORDER — DEXAMETHASONE SODIUM PHOSPHATE 4 MG/ML IJ SOLN
INTRAMUSCULAR | Status: AC
  Filled 2014-05-05: qty 2

## 2014-05-05 MED ORDER — MUPIROCIN 2 % EX OINT
TOPICAL_OINTMENT | CUTANEOUS | Status: AC
  Filled 2014-05-05: qty 22

## 2014-05-05 MED ORDER — 0.9 % SODIUM CHLORIDE (POUR BTL) OPTIME
TOPICAL | Status: DC | PRN
  Administered 2014-05-05: 1000 mL

## 2014-05-05 MED ORDER — MIDAZOLAM HCL 5 MG/5ML IJ SOLN
INTRAMUSCULAR | Status: DC | PRN
  Administered 2014-05-05: 2 mg via INTRAVENOUS

## 2014-05-05 MED ORDER — SODIUM CHLORIDE 0.9 % IV SOLN
INTRAVENOUS | Status: DC
  Administered 2014-05-05: 16:00:00 via INTRAVENOUS

## 2014-05-05 MED ORDER — THROMBIN 5000 UNITS EX SOLR
CUTANEOUS | Status: AC
  Filled 2014-05-05: qty 20000

## 2014-05-05 MED ORDER — DEXAMETHASONE SODIUM PHOSPHATE 4 MG/ML IJ SOLN
INTRAMUSCULAR | Status: DC | PRN
  Administered 2014-05-05: 8 mg via INTRAVENOUS

## 2014-05-05 MED ORDER — MIDAZOLAM HCL 2 MG/2ML IJ SOLN
INTRAMUSCULAR | Status: AC
  Filled 2014-05-05: qty 2

## 2014-05-05 MED ORDER — GLYCOPYRROLATE 0.2 MG/ML IJ SOLN
INTRAMUSCULAR | Status: DC | PRN
  Administered 2014-05-05: 0.4 mg via INTRAVENOUS

## 2014-05-05 MED ORDER — FENTANYL CITRATE (PF) 100 MCG/2ML IJ SOLN
INTRAMUSCULAR | Status: DC | PRN
  Administered 2014-05-05 (×2): 50 ug via INTRAVENOUS
  Administered 2014-05-05: 100 ug via INTRAVENOUS
  Administered 2014-05-05: 50 ug via INTRAVENOUS

## 2014-05-05 MED ORDER — SODIUM CHLORIDE 0.9 % IJ SOLN: 3.0000 mL | Freq: Two times a day (BID) | INTRAMUSCULAR | Status: DC

## 2014-05-05 MED ORDER — PHENOL 1.4 % MT LIQD: 1.0000 | OROMUCOSAL | Status: DC | PRN

## 2014-05-05 MED ORDER — TRAZODONE HCL 100 MG PO TABS: 100.0000 mg | ORAL_TABLET | Freq: Every evening | ORAL | Status: DC | PRN

## 2014-05-05 SURGICAL SUPPLY — 66 items
ADH SKN CLS APL DERMABOND .7 (GAUZE/BANDAGES/DRESSINGS) ×1
ADH SKN CLS LQ APL DERMABOND (GAUZE/BANDAGES/DRESSINGS) ×1
BUR MATCHSTICK NEURO 3.0 LAGG (BURR) IMPLANT
BUR ROUND FLUTED 4 SOFT TCH (BURR) ×1 IMPLANT
CANISTER SUCTION 2500CC (MISCELLANEOUS) ×2 IMPLANT
CORDS BIPOLAR (ELECTRODE) ×1 IMPLANT
COUNTER NEEDLE 20 DBL MAG RED (NEEDLE) ×1 IMPLANT
COVER MAYO STAND STRL (DRAPES) ×2 IMPLANT
COVER SURGICAL LIGHT HANDLE (MISCELLANEOUS) ×2 IMPLANT
DERMABOND ADHESIVE PROPEN (GAUZE/BANDAGES/DRESSINGS) ×1
DERMABOND ADVANCED (GAUZE/BANDAGES/DRESSINGS) ×1
DERMABOND ADVANCED .7 DNX12 (GAUZE/BANDAGES/DRESSINGS) ×1 IMPLANT
DERMABOND ADVANCED .7 DNX6 (GAUZE/BANDAGES/DRESSINGS) IMPLANT
DRAPE C-ARM 42X72 X-RAY (DRAPES) ×2 IMPLANT
DRAPE MICROSCOPE LEICA (MISCELLANEOUS) ×2 IMPLANT
DRAPE PROXIMA HALF (DRAPES) IMPLANT
DRAPE SURG 17X23 STRL (DRAPES) ×8 IMPLANT
DRSG MEPILEX BORDER 4X4 (GAUZE/BANDAGES/DRESSINGS) IMPLANT
DRSG MEPILEX BORDER 4X8 (GAUZE/BANDAGES/DRESSINGS) ×1 IMPLANT
DURAPREP 26ML APPLICATOR (WOUND CARE) ×2 IMPLANT
ELECT BLADE 4.0 EZ CLEAN MEGAD (MISCELLANEOUS) ×2
ELECT CAUTERY BLADE 6.4 (BLADE) ×2 IMPLANT
ELECT REM PT RETURN 9FT ADLT (ELECTROSURGICAL) ×2
ELECTRODE BLDE 4.0 EZ CLN MEGD (MISCELLANEOUS) ×1 IMPLANT
ELECTRODE REM PT RTRN 9FT ADLT (ELECTROSURGICAL) ×1 IMPLANT
EXPAREL 20 ML IMPLANT
GLOVE BIO SURGEON STRL SZ7.5 (GLOVE) ×1 IMPLANT
GLOVE BIOGEL PI IND STRL 7.5 (GLOVE) IMPLANT
GLOVE BIOGEL PI IND STRL 8 (GLOVE) ×1 IMPLANT
GLOVE BIOGEL PI INDICATOR 7.5 (GLOVE) ×1
GLOVE BIOGEL PI INDICATOR 8 (GLOVE) ×1
GLOVE ECLIPSE 8.5 STRL (GLOVE) ×2 IMPLANT
GLOVE ORTHO TXT STRL SZ7.5 (GLOVE) ×2 IMPLANT
GLOVE SURG 8.5 LATEX PF (GLOVE) ×2 IMPLANT
GLOVE SURG SS PI 7.5 STRL IVOR (GLOVE) ×1 IMPLANT
GOWN STRL REUS W/ TWL LRG LVL3 (GOWN DISPOSABLE) ×2 IMPLANT
GOWN STRL REUS W/TWL 2XL LVL3 (GOWN DISPOSABLE) ×4 IMPLANT
GOWN STRL REUS W/TWL LRG LVL3 (GOWN DISPOSABLE) ×2
KIT BASIN OR (CUSTOM PROCEDURE TRAY) ×2 IMPLANT
KIT ROOM TURNOVER OR (KITS) ×2 IMPLANT
NDL SPNL 18GX3.5 QUINCKE PK (NEEDLE) ×2 IMPLANT
NEEDLE 22X1 1/2 (OR ONLY) (NEEDLE) ×1 IMPLANT
NEEDLE SPNL 18GX3.5 QUINCKE PK (NEEDLE) ×4 IMPLANT
NS IRRIG 1000ML POUR BTL (IV SOLUTION) ×2 IMPLANT
PACK LAMINECTOMY ORTHO (CUSTOM PROCEDURE TRAY) ×2 IMPLANT
PAD ARMBOARD 7.5X6 YLW CONV (MISCELLANEOUS) ×4 IMPLANT
PATTIES SURGICAL .5 X.5 (GAUZE/BANDAGES/DRESSINGS) IMPLANT
PATTIES SURGICAL .75X.75 (GAUZE/BANDAGES/DRESSINGS) IMPLANT
SPONGE LAP 4X18 X RAY DECT (DISPOSABLE) ×1 IMPLANT
SPONGE SURGIFOAM ABS GEL 100 (HEMOSTASIS) ×1 IMPLANT
SUT BONE WAX W31G (SUTURE) ×1 IMPLANT
SUT VIC AB 1 CT1 27 (SUTURE) ×2
SUT VIC AB 1 CT1 27XBRD ANBCTR (SUTURE) IMPLANT
SUT VIC AB 2-0 CT1 27 (SUTURE) ×2
SUT VIC AB 2-0 CT1 TAPERPNT 27 (SUTURE) IMPLANT
SUT VIC AB 2-0 FS1 27 (SUTURE) ×1 IMPLANT
SUT VIC AB 3-0 FS2 27 (SUTURE) ×1 IMPLANT
SUT VICRYL 0 UR6 27IN ABS (SUTURE) ×1 IMPLANT
SUT VICRYL 4-0 PS2 18IN ABS (SUTURE) ×1 IMPLANT
SYR 20CC LL (SYRINGE) ×2 IMPLANT
SYR CONTROL 10ML LL (SYRINGE) ×2 IMPLANT
THROMBIN 5000 UNITS IMPLANT
TOWEL OR 17X24 6PK STRL BLUE (TOWEL DISPOSABLE) ×3 IMPLANT
TOWEL OR 17X26 10 PK STRL BLUE (TOWEL DISPOSABLE) ×2 IMPLANT
TRAY FOLEY CATH 16FRSI W/METER (SET/KITS/TRAYS/PACK) IMPLANT
TRAY FOLEY W/METER SILVER 14FR (SET/KITS/TRAYS/PACK) ×1 IMPLANT

## 2014-05-05 NOTE — H&P (Signed)
James AnchorsDaniel J Stevens is an 54 y.o. male.   Mr. James Stevens returns today in followup of his lumbar spine.  He has findings of bilateral lateral recess stenosis affecting lateral recesses at the L4-5 level and L5-S1 levels.  His last studies suggested some degree of foraminal entrapment along the right side at L5 level as well.  Complains of pain when he stands and ambulates when he was walking even short distances.  Has to sit down to improve his pain.  Standing in one place does not improve his pain.  Pain is not worsened by using a stationary bicycle.  No bowel or bladder symptoms.  Notices he does have some difficulties with voiding with dribbling, consistent with prostate.  He has undergone epidural steroid injections, which only temporized his discomfort.     He underwent recent MRI study, repeat study that was done previous.  Done almost a year ago in September 2014.  Results of his MRI scan indicate that he does have triangulation of the thecal sac at the L4-5 level with subarticular lateral recess stenosis affecting the lateral recess, left side greater than right.  There is also a disk bulge downturning the disk centrally and mild to moderate central canal narrowing.  Narrowing of the lateral recesses that could affect either S1 nerve roots at the L5-S1 level.     PAST MEDICAL HISTORY:  Significant for acid reflux, depression, anxiety, arthritis, migraines, sleep apnea, teeth and gum disease.     FAMILY HISTORY:  Significant for diabetes.   SOCIAL HISTORY:  He is divorced.  Disabled.  Does smoke a half-pack of cigarettes per day; smoked for 15 years.  Does not drink alcohol.     Past Medical History  Diagnosis Date  . Chronic back pain     and right hip  . GERD (gastroesophageal reflux disease)     occ tums  . Arthritis   . Hip pain   . Schizophrenia   . Bipolar affective   . 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 Depression   .  Headache(784.0)     migraines  . Shortness of breath dyspnea     "if I walk a long way"    Past Surgical History  Procedure Laterality Date  . Mandible fracture surgery    . Esophagogastroduodenoscopy  09/06/2011    Procedure: ESOPHAGOGASTRODUODENOSCOPY (EGD);  Surgeon: Vertell NovakJames L Edwards Stevens., MD;  Location[facility];  Service: Endoscopy;  Laterality: N/A;  . Savory dilation  09/06/2011    Procedure: SAVORY DILATION;  Surgeon: Vertell NovakJames L Edwards Stevens., MD;  Location: [facility];  Service: Endoscopy;  Laterality: N/A;  . Esophageal dilation    . Total hip arthroplasty Left 04/17/2012    Procedure: LEFT TOTAL HIP ARTHROPLASTY ANTERIOR APPROACH;  Surgeon: Kathryne Hitchhristopher Y Blackman, MD;  [facility];  Service: Orthopedics;  Laterality: Left;  . Joint replacement Bilateral     rt hip replacement, and  LT hip  . Fracture surgery      left fingers # 4,5  . Aorta - bilateral femoral artery bypass graft N/A 10/07/2013    Procedure: AORTA BIFEMORAL BYPASS GRAFT;  Surgeon: Sherren Kernsharles E Fields, MD;  [facility];  Service: Vascular;  Laterality: N/A;    Family History  Problem Relation Age of Onset  . Diabetes Mother   . Diabetes Father   . Diabetes Other    Social History:  reports that he has been  smoking Cigarettes.  He has a 6.8 pack-year smoking history. He has never used smokeless tobacco. He reports that he drinks alcohol. He reports that he uses illicit drugs (Cocaine) about twice per week.  Allergies:  Allergies  Allergen Reactions  . Ibuprofen Other (See Comments)    Gastric irritation  . Tramadol Itching    No prescriptions prior to admission    No results found for this or any previous visit (from the past 48 hour(s)). No results found.  Review of Systems  Unable to perform ROS Constitutional: Negative for fever, chills, weight loss, malaise/fatigue and diaphoresis.  HENT: Negative for congestion, ear discharge, ear pain, hearing loss, nosebleeds, sore throat and tinnitus.    Eyes: Negative for blurred vision, double vision, photophobia, pain, discharge and redness.  Respiratory: Negative for cough, hemoptysis, sputum production, shortness of breath, wheezing and stridor.   Cardiovascular: Positive for claudication. Negative for chest pain, palpitations, orthopnea, leg swelling and PND.  Gastrointestinal: Negative for heartburn, nausea, vomiting, abdominal pain, diarrhea, constipation, blood in stool and melena.  Genitourinary: Negative for dysuria, urgency, frequency, hematuria and flank pain.  Musculoskeletal: Positive for back pain. Negative for myalgias, joint pain, falls and neck pain.  Skin: Negative for itching and rash.  Neurological: Positive for tingling, sensory change, focal weakness and weakness. Negative for dizziness, tremors, speech change, seizures, loss of consciousness and headaches.  Endo/Heme/Allergies: Negative for environmental allergies and polydipsia. Does not bruise/bleed easily.  Psychiatric/Behavioral: Negative for depression, suicidal ideas, hallucinations, memory loss and substance abuse. The patient is not nervous/anxious and does not have insomnia.     There were no vitals taken for this visit. Physical Exam  Constitutional: He is oriented to person, place, and time. He appears well-developed and well-nourished. No distress.  HENT:  Head: Normocephalic and atraumatic.  Right Ear: External ear normal.  Left Ear: External ear normal.  Nose: Nose normal.  Mouth/Throat: Oropharynx is clear and moist.  Eyes: Conjunctivae and EOM are normal. Pupils are equal, round, and reactive to light. Right eye exhibits no discharge. Left eye exhibits no discharge. No scleral icterus.  Neck: Normal range of motion. Neck supple. No JVD present. No tracheal deviation present. No thyromegaly present.  Cardiovascular: Normal rate, regular rhythm, normal heart sounds and intact distal pulses.  Exam reveals no gallop and no friction rub.   No murmur  heard. Respiratory: Effort normal and breath sounds normal. No stridor. No respiratory distress. He has no wheezes. He has no rales. He exhibits no tenderness.  GI: Soft. Bowel sounds are normal. He exhibits no distension and no mass. There is no tenderness. There is no rebound and no guarding.  Musculoskeletal: He exhibits tenderness. He exhibits no edema.  Lymphadenopathy:    He has no cervical adenopathy.  Neurological: He is alert and oriented to person, place, and time. He displays abnormal reflex. No cranial nerve deficit. He exhibits normal muscle tone. Coordination normal.  Skin: Skin is warm and dry. No rash noted. He is not diaphoretic. No erythema. No pallor.  Psychiatric: He has a normal mood and affect. His behavior is normal. Judgment and thought content normal.    PHYSICAL EXAMINATION:  Clinically James Stevens's exam shows his sciatic tension tests are unremarkable.  Popliteal compression test negative.  EHL strength appears intact bilaterally, as does foot dorsiflexion/plantarflexion strength, knee extension/knee flexion strength.  The patient's height is 5 feet 10 inches.  Weight 143 pounds.     ASSESSMENT:   1.  Overall, James Stevens's complaints are those  of neurogenic claudication, pain with standing and ambulation any distance.  Relieved by sitting.  Does not improve by merely stopping in one place and allowing for recovery of muscle function, as would be seen with vascular claudication.  He is able to do stationary bicycle.  Overall, I believe that James Stevens's condition is neurogenic claudication.  He had overriding problem with vascular claudication as well.  Vascular claudication has been solved with the bypass procedure.   2.  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    PLAN:  At this point, I think lateral recess decompression is appropriate as the patient is incapable of performing exercise in excess of walking more than 50-75 feet before he needs to  find a place to sit.  Discussed with James Stevens the consideration of surgical intervention.  My recommendation would be for a bilateral lateral recess decompression at L4-5, L5-S1.  We will decompress the neural foramen on the right side at L5 as well.  The risk of surgery, including risks of infection, bleeding and neurologic compromise, discussed with James Stevens.  Our intention would be to get him off of all pain medicine by 6 weeks postop.  Hydrocodone 5/325 written today.  The risks and benefits of the procedure, including risk of infection, bleeding and neurologic compromise discussed with James Stevens.  I expect that lateral recess decompression will improve this patient's standing tolerance and walking tolerance.  He will still have degenerative disk disease and he will still have some degree of spondylosis changes, which would amount to persistent back pain.  This should not limit his standing and walking tolerance, though.  We will go ahead and schedule him for surgery.    OWENS,James M 05/05/2014, 1:19 AM

## 2014-05-05 NOTE — Anesthesia Postprocedure Evaluation (Signed)
  Anesthesia Post-op Note  Patient: James Stevens  Procedure(s) Performed: Procedure(s): BILATERAL LUMBAR LAMINECTOMY L4-5 AND L5-S1 (N/A)  Patient Location: PACU  Anesthesia Type:General  Level of Consciousness: awake and alert   Airway and Oxygen Therapy: Patient Spontanous Breathing  Post-op Pain: mild  Post-op Assessment: Post-op Vital signs reviewed  Post-op Vital Signs: stable  Last Vitals:  Filed Vitals:   05/05/14 1419  BP: 128/82  Pulse: 94  Temp: 36.7 C  Resp: 20    Complications: No apparent anesthesia complications

## 2014-05-05 NOTE — Discharge Instructions (Addendum)
° ° °  No lifting greater than 10 lbs. Avoid bending, stooping and twisting. Walk in house for first week them may start to get out slowly increasing distance up to one half mile by 3 weeks post op. Keep incision dry for 3 days, may use tegaderm or similar water impervious dressing.  Spinal Fusion Care After Refer to this sheet in the next few weeks. These instructions provide you with information on caring for yourself after your procedure. Your caregiver may also give you more specific instructions. Your treatment has been planned according to current medical practices, but problems sometimes occur. Call your caregiver if you have any problems or questions after your procedure. HOME CARE INSTRUCTIONS   Take whatever pain medicine has been prescribed by your caregiver. Do not take over-the-counter pain medicine unless directed otherwise by your caregiver.  Do not drive if you are taking narcotic pain medicines.  Change your bandage (dressing) if necessary or as directed by your caregiver.  Do not get your surgical cut (incision) wet. After a few days you may take quick showers (rather than baths), but keep your incision clean and dry. Covering the incision with plastic wrap while you shower should keep your incision dry. A few weeks after surgery, once your incision has healed and your caregiver says it is okay, you can take baths or go swimming.  If you have been prescribed medicine to prevent your blood from clotting, follow the directions carefully.  Check the area around your incision often. Look for redness and swelling. Also, look for anything leaking from your wound. You can use a mirror or have a family member inspect your incision if it is in a place where it is difficult for you to see.  Ask your caregiver what activities you should avoid and for how long.  Walk as much as possible.  Do not lift anything heavier than 10 pounds (4.5 kilograms) until your caregiver says it is  safe.  Do not twist or bend for a few weeks. Try not to pull on things. Avoid sitting for long periods of time. Change positions at least every hour.  Ask your caregiver what kinds of exercise you should do to make your back stronger and when you should begin doing these exercises. SEEK IMMEDIATE MEDICAL CARE IF:   Pain suddenly becomes much worse.  The incision area is red, swollen, bleeding, or leaking fluid.  Your legs or feet become increasingly painful, numb, weak, or swollen.  You have trouble controlling urination or bowel movements.  You have trouble breathing.  You have chest pain.  You have a fever. MAKE SURE YOU:  Understand these instructions.  Will watch your condition.  Will get help right away if you are not doing well or get worse. Document Released: 07/16/2004 Document Revised: 03/21/2011 Document Reviewed: 03/11/2010 [facility] Patient Information 2015 [employer] [place]. This information is not intended to replace advice given to you by your health care provider. Make sure you discuss any questions you have with your health care provider.

## 2014-05-05 NOTE — Op Note (Addendum)
05/05/2014  9:10 AM  PATIENT:  James Stevens  54 y.o. male  MRN: [id number]  OPERATIVE REPORT  PRE-OPERATIVE DIAGNOSIS:  Lumbar spinal stenosis bilateral L4-5 and L5-S1  POST-OPERATIVE DIAGNOSIS:  * No post-op diagnosis entered *  PROCEDURE:  Procedure(s): BILATERAL LUMBAR LAMINECTOMY L4-5 AND L5-S1 with OR microscope, right L5-S1 microdiscectomy.    SURGEON:  Jessy Oto, MD     ASSISTANT: Benjiman Core, PA-C  (Present throughout the entire procedure and necessary for completion of procedure in a timely manner)     ANESTHESIA:  General,supplemented with local anesthesia marcaine 1/% 1:1 exparel 1.3% total of 30 cc,  Dr. Orene Desanctis.    COMPLICATIONS:  None.  FINDINGS: Severe lateral recess stenosis bilateral L4-5 and bilateral L5-S1. Hard disc protrusion L4-5 and L5-S1 with retrolisthesis of L5 on S1.   PROCEDURE:The patient was met in the holding area, and the appropriate lumbar level left L5-S1 and L4-5 identified and marked with "x" and my initials.The patient was then transported to OR and was placed under general anesthesia without difficulty. The patient received appropriate preoperative antibiotic prophylaxis ancef 3 gm. Foley catheter was placed sterilely. The patient after intubation atraumatically was transferred to the operating room table, prone position, Wilson frame, sliding OR table. All pressure points were well padded. The arms in 90-90 well-padded at the elbows. Standard prep with DuraPrep solution lower dorsal spine to the mid sacral segment. Draped in the usual manner iodine Vi-Drape was used. Time-out procedure was called and correct. Incision at the expected L5-S1 level. C-arm was draped sterilely to the field and used to identify the lumbar level. The clamp was at the lower aspect of the spinous process of L5 upper S1level. Skin superior to this was then infiltrated with MARCAINE1/2% 1:1 EXPAREL 1.3% total of 10 cc used. An incision approximately 2 1/2 inch  in  length was then made through skin and subcutaneous layers in line with the midline extending superiorly to the L3-4 level and inferiorly to S1. An incision made into the right and left lumbosacral fascia and cobb elevators used to perform subperiosteal exposure of the L4-5 and L5-S1 interlaminar posterior elements bilaterally.   C-arm fluoroscopy was identified the [facility] retractors at the appropriate level L5-S1 and L4-5. Using loope magnification the L5-S1 interspace carefully debrided the small amount of muscle attachment here and high-speed bur used to drill the medial aspect of the inferior articular process of L5 and L4 approximately 10%. . 2 mm Kerrison then used to enter the spinal canal over the superior aspect of the S1 lamina carefully using the Kerrison to debris the attachment as a curet. Foraminotomy was then performed over the S1 nerve root. The medial 10% superior articular process of S1 and then resected using an osteotome and 2 mm Kerrison. This allowed for identification of the thecal sac.Attention then turned to the right L4-5 level which was easily visualized with loop magnification and the [employer] retractors. Soft tissues debrided about the Left L4-5 area over the posterior aspect of the L4-5 interspace. High-speed bur and then used to carefully drill inferior 3 or 4 mm of the left side L4 lamina and on the medial aspect of the left L4 inferior articular process of 3 mm. The superior margin of the L5 lamina then carefully debrided with curette and 28m kerrison then used to enter the spinal canal over the superior aspect of the L5 lamina resecting bone over the superior aspect and freeing up the attachment of ligamentum flavum  here. Ligamentum flavum then debrided with the 3 mm Kerrison left L5 nerve root and the lateral recess decompressed using 2 and 3 mm Kerrisons sizing hypertrophic reflected ligamentum flavum extending superiorly. From was resected off the ventral aspect  of the inferior margin of the L4 lamina. A localization lateral C-arm view was obtained with Penfield 4 in the L4-5 and nerve hook at the L5-S1 level. Hockey-stick nerve probe could then be passed out the L4 neuroforamen of the L5 neuroforamen. Venous bleeding encountered. Irrigation was carried out down to this bleeding controlled with Gelfoam. Gelfoam was then removed. Irrigation and careful examination demonstrated no active bleeding present. Attention then turned to the right side at the L4-L5 and S1 level. Using loope magnification the right L5-S1 interspace carefully debrided the small amount of muscle attachment here and high-speed bur used to drill the medial aspect of the inferior articular process of L5 and L4 approximately 10%. A localization preformed by identifying the corresponding left laminotomy at L4-5 and its relationship to the right L4-5 posterior interlaminar space. 2 mm Kerrison then used to enter the spinal canal over the superior aspect of the S1 lamina carefully using the Kerrison to debris the attachment as a curet. Foraminotomy was then performed over the S1 nerve root. The medial 10% superior articular process of S1 and then resected using 2 mm Kerrison. This allowed for identification of the thecal sac.The operating room microscope sterilely draped brought into the field. The S1 nerve root was then carefully retracted medially using first a derricho retractor. Bleeding from epidural veins required bipolar electrocautery and Gelfoam with cottonoids. The protruded herniated disc on the right side at L5-S1 was identified and incised with 15 blade a long handled scalpel. Pituitary rongeurs were used to debride the disc of degenerated and herniated disc material centrally. Care was taken to retract the S1 nerve root lateral thecal sac allowing for the disc to be excised across the midline to the left side as well as right side. Up-biting pituitary rongeurs were used and the disc space  debrided until no further loose fragments were found to be present. A Woodson retractor was used to examine the spinal canal and the area posterior to the L5-S1 disc space showed no significant remaining disc protrusion however a central median osteophyte appeared to be present which was left in place. The S1 and L5 neuroforamen probe freely with no sign remaining nerve compression. Attention then turned to the right L4-5 level which was easily visualized with loop magnification and the [facility] retractor. Soft tissues debrided about the right L4-5 area over the posterior aspect of the L4-5 interspace. High-speed bur and then used to carefully drill inferior 3 or 4 mm of the right side L4 lamina and on the medial aspect of the left L4 inferior articular process of 3 mm. The superior margin of the L5 lamina then carefully debrided with curette and 65m kerrison then used to enter the spinal canal over the superior aspect of the L5 lamina resecting bone over the superior aspect and freeing up the attachment of ligamentum flavum here. Ligamentum flavum then debrided with the 3 mm Kerrison left L5 nerve root and the lateral recess decompressed using 2 and 3 mm Kerrisons sizing hypertrophic reflected ligamentum flavum extending superiorly. Flavum was resected off the ventral aspect of the inferior margin of the L4 lamina. Hockey-stick nerve probe could then be passed out the right L4 neuroforamen and the right L5 neuroforamen. Venous bleeding encountered. Thrombin-soaked Gelfoam used to control  this following this then the sac and the L5 nerve root were mobilized medially and the L4-5 disc examined and found not to be herniated. The L4-5 disc space showed retrolisthesis of L4 on L5 with a median osteophyte lateral recess stenosis. Lateral recess decompression was carried out. Retractors were then carefully removed bleeding was then controlled using bipolar electrocautery. Small amount of bleeding within the soft tissue  mass the laminotomy area was controlled using bipolar electrocautery. Retractors were then adjusted on the left side and the left L4-5 and left L5-S1 disc explored. The left L5-S1 this showed no further residual central or left-sided disc protrusion. A median osteophyte observed to be present from this left L5 S1 laminotomy. L5-S1 appeared to have been decompressed from the right side adequately. The L5 and S1 neuroforamen appeared well decompressed when probed with a Woodson and ball-tipped nerve hooks. The left L4-5 laminotomy was evaluated and the left L4-5 disc explored and showed no signs of disc herniation and again a median osteophyte was determined to be present. The left L4 and L5 neuroforamen probe to and showed no sign of nerve root compression. Irrigation was carried out using copious amounts of irrigant solution. All Gelfoam were then removed. No significant active bleeding present at the time of removal. All instruments sponge counts were correct retraction system was then carefully removed and only bipolar electrocautery of any small bleeders.Gelfoam foam was removed from the right and left sides and irrigation was performed. No active bleeding was present. This patient has a history of low platelet count. Subcutaneous tissue and fascia further infiltrated with MARCAINE1/2% 1:1 EXPAREL 1.3%, total amount: 30 ml. Lumbodorsal fascia was then carefully approximated with interrupted 0 Vicryl sutures, UR 6 needle deep subcutaneous layers were approximated with interrupted 0 Vicryl sutures on UR 6 the appear subcutaneous layers approximated with interrupted 2-0 Vicryl sutures and the skin closed with a running subcutaneous stitch of 4-0 Vicryl. Dermabond was applied allowed to dry and then Mepilex bandage applied. Patient was then carefully returned to supine position on a stretcher, reactivated and extubated. He was then returned to recovery room in satisfactory condition.  Benjiman Core PA-C perform the  duties of assistant surgeon during this case. He was present from the beginning of the case to the end of the case assisting in transfer the patient from his stretcher to the OR table and back to the stretcher at the end of the case. Assisted in careful retraction and suction of the laminectomy site delicate neural structures operating under the operating room microscope. He performed closure of the incision from the fascia to the skin applying the dressing.    Luke Falero E  05/05/2014, 9:10 AM

## 2014-05-05 NOTE — Interval H&P Note (Signed)
History and Physical Interval Note:  05/05/2014 8:00 AM  James Stevens  has presented today for surgery, with the diagnosis of Lumbar spinal stenosis bilateral L4-5 and L5-S1  The various methods of treatment have been discussed with the patient and family. After consideration of risks, benefits and other options for treatment, the patient has consented to  Procedure(s): BILATERAL LUMBAR LAMINECTOMY L4-5 AND L5-S1 (N/A) as a surgical intervention .  The patient's history has been reviewed, patient examined, no change in status, stable for surgery.  I have reviewed the patient's chart and labs.  Questions were answered to the patient's satisfaction.     Leandrea Ackley E

## 2014-05-05 NOTE — Progress Notes (Signed)
Patient wants to keep cell phone with him. Dr. Massage notified of PO intake.

## 2014-05-05 NOTE — Anesthesia Preprocedure Evaluation (Addendum)
Anesthesia Evaluation  Patient identified by MRN, date of birth, ID band Patient awake  General Assessment Comment:Pt admits to cocaine 2xper wk last was 4 days ago.tb  Reviewed: Allergy & Precautions, NPO status , Patient's Chart, lab work & pertinent test results  History of Anesthesia Complications Negative for: history of anesthetic complications  Airway Mallampati: II  TM Distance: <3 FB Neck ROM: Full    Dental  (+) Teeth Intact, Dental Advisory Given, Partial Lower, Partial Upper,    Pulmonary shortness of breath, Current Smoker,  breath sounds clear to auscultation        Cardiovascular + Peripheral Vascular Disease Rhythm:Regular Rate:Normal  Prev Aorto bifem   Neuro/Psych PSYCHIATRIC DISORDERS Depression Bipolar Disorder Schizophrenia    GI/Hepatic Neg liver ROS, GERD-  Controlled,  Endo/Other  negative endocrine ROS  Renal/GU negative Renal ROS     Musculoskeletal  (+) Arthritis -, Osteoarthritis,    Abdominal   Peds  Hematology negative hematology ROS (+)   Anesthesia Other Findings   Reproductive/Obstetrics                          Anesthesia Physical Anesthesia Plan  ASA: III  Anesthesia Plan: General   Post-op Pain Management:    Induction: Intravenous  Airway Management Planned: Oral ETT  Additional Equipment:   Intra-op Plan:   Post-operative Plan: Extubation in OR  Informed Consent: I have reviewed the patients History and Physical, chart, labs and discussed the procedure including the risks, benefits and alternatives for the proposed anesthesia with the patient or authorized representative who has indicated his/her understanding and acceptance.   Dental advisory given  Plan Discussed with:   Anesthesia Plan Comments:         Anesthesia Quick Evaluation

## 2014-05-05 NOTE — Brief Op Note (Signed)
05/05/2014  11:24 AM  PATIENT:  James Stevens  54 y.o. male  PRE-OPERATIVE DIAGNOSIS:  Lumbar spinal stenosis bilateral L4-5 and L5-S1  POST-OPERATIVE DIAGNOSIS:   Lumbar spinal stenosis bilateral L4-5 and L5-S1  PROCEDURE:  Procedure(s): BILATERAL LUMBAR LAMINECTOMY L4-5 AND L5-S1 (N/A)with microscope  SURGEON:  Surgeon(s) and Role: James ChampagneJames E Toneshia Coello, MD - Primary  PHYSICIAN ASSISTANT: Zonia KiefJames Owens, PA-C  ANESTHESIA:   local and general, Dr. Jacklynn BueMassagee  EBL:  Total I/O In: 1000 [I.V.:1000] Out: 465 [Urine:315; Blood:150]  BLOOD ADMINISTERED:none  DRAINS: Urinary Catheter (Foley)   LOCAL MEDICATIONS USED:  MARCAINE 0.5% 1:1 EXPAREL 1.3% Amount: 30 ml  SPECIMEN:  No Specimen  DISPOSITION OF SPECIMEN:  N/A  COUNTS:  YES  TOURNIQUET:  * No tourniquets in log *  DICTATION: .Dragon Dictation  PLAN OF CARE: Admit to inpatient   PATIENT DISPOSITION:  PACU - hemodynamically stable.   Delay start of Pharmacological VTE agent (>24hrs) due to surgical blood loss or risk of bleeding: yes

## 2014-05-05 NOTE — Transfer of Care (Signed)
Immediate Anesthesia Transfer of Care Note  Patient: James Stevens  Procedure(s) Performed: Procedure(s): BILATERAL LUMBAR LAMINECTOMY L4-5 AND L5-S1 (N/A)  Patient Location: PACU  Anesthesia Type:General  Level of Consciousness: awake, alert , oriented and patient cooperative  Airway & Oxygen Therapy: Patient Spontanous Breathing and Patient connected to nasal cannula oxygen  Post-op Assessment: Report given to RN and Post -op Vital signs reviewed and stable  Post vital signs: Reviewed and stable  Last Vitals:  Filed Vitals:   05/05/14 0644  BP: 110/76  Pulse: 71  Temp: 36.6 C  Resp: 18    Complications: No apparent anesthesia complications

## 2014-05-06 MED ORDER — TIZANIDINE HCL 4 MG PO TABS: 4.0000 mg | ORAL_TABLET | ORAL | Status: DC | PRN

## 2014-05-06 MED ORDER — DOCUSATE SODIUM 100 MG PO CAPS: 100.0000 mg | ORAL_CAPSULE | Freq: Two times a day (BID) | ORAL | Status: DC

## 2014-05-06 MED ORDER — PANTOPRAZOLE SODIUM 40 MG PO TBEC
40.0000 mg | DELAYED_RELEASE_TABLET | Freq: Every day | ORAL | Status: DC
Start: 2014-05-06 — End: 2014-05-06

## 2014-05-06 MED ORDER — OXYCODONE-ACETAMINOPHEN 5-325 MG PO TABS: 1.0000 | ORAL_TABLET | ORAL | Status: DC | PRN

## 2014-05-06 MED FILL — Thrombin For Soln 20000 Unit: CUTANEOUS | Qty: 1 | Status: AC

## 2014-05-06 MED FILL — Thrombin For Soln 5000 Unit: CUTANEOUS | Qty: 4 | Status: AC

## 2014-05-06 NOTE — Evaluation (Signed)
Physical Therapy Evaluation Patient Details Name: James Stevens MRN: [id number] DOB: 22-Apr-1960 Today's Date: 05/06/2014   History of Present Illness  54 yo male with spinal stenosis and neurogenic claudication, now with L4-5, L5-S1 laminectomy and microdiscectomy  Clinical Impression  Pt was assessed with imminent discharge and will need HHPT for follow up as he is still somewhat unsteady and in need of SPC to control his balance.  Will anticipate MD will refer on to outpatient therapy when appropriate as he is post-op day 1.    Follow Up Recommendations Home health PT;Supervision - Intermittent    Equipment Recommendations  3in1 (PT)    Recommendations for Other Services       Precautions / Restrictions Precautions Precautions: Back;Fall Precaution Booklet Issued: Yes (comment) Precaution Comments: Pt could repeat the instructions to PT Restrictions Weight Bearing Restrictions: No      Mobility  Bed Mobility Overal bed mobility: Needs Assistance Bed Mobility: Supine to Sit     Supine to sit: Min assist     General bed mobility comments: reviewed body mechanics  Transfers Overall transfer level: Needs assistance Equipment used: 1 person hand held assist;Straight cane Transfers: Sit to/from Stand;Stand Pivot Transfers Sit to Stand: Min guard;Min assist (depending on height of surface) Stand pivot transfers: Min guard       General transfer comment: reminders needed for hand placement  Ambulation/Gait Ambulation/Gait assistance: Min guard Ambulation Distance (Feet): 150 Feet Assistive device: 1 person hand held assist;Straight cane Gait Pattern/deviations: Step-through pattern;Decreased step length - left;Decreased step length - right;Decreased dorsiflexion - right;Decreased dorsiflexion - left;Wide base of support Gait velocity: reduced Gait velocity interpretation: Below normal speed for age/gender General Gait Details: pt is demonstrating some  unsteadiness related to LE weakness, able to climb stairs with one rail as he must to enter house  Stairs Stairs: Yes Stairs assistance: Min guard Stair Management: One rail Left;Alternating pattern Number of Stairs: 10 General stair comments: cues for safety but otherwise able to handle stairs  Wheelchair Mobility    Modified Rankin (Stroke Patients Only)       Balance Overall balance assessment: Needs assistance Sitting-balance support: Feet supported Sitting balance-Leahy Scale: Good   Postural control: Posterior lean Standing balance support: Single extremity supported Standing balance-Leahy Scale: Fair Standing balance comment: pt is recovering LE strength since his back surgery for LE pain                             Pertinent Vitals/Pain Pain Assessment: 0-10 Pain Score: 5  Pain Location: back Pain Intervention(s): Limited activity within patient's tolerance;Monitored during session;Premedicated before session;Repositioned;Other (comment) (instructed body mechanics)    Home Living Family/patient expects to be discharged to:: Private residence Living Arrangements: Alone Available Help at Discharge: Family Type of Home: House Home Access: Stairs to enter Entrance Stairs-Rails: Left Entrance Stairs-Number of Steps: 4 Home Layout: One level Home Equipment: Cane - single point;Walker - 2 wheels Additional Comments: First stated he didnt' have 3 in one then stated he did    Prior Function Level of Independence: Independent with assistive device(s)         Comments: used crutches due to claudication PTA     Hand Dominance   Dominant Hand: Right    Extremity/Trunk Assessment   Upper Extremity Assessment: Overall WFL for tasks assessed           Lower Extremity Assessment: Generalized weakness      Cervical /  Trunk Assessment: Normal  Communication   Communication: Other (comment) (speech is a bit unclear at times)  Cognition  Arousal/Alertness: Awake/alert Behavior During Therapy: WFL for tasks assessed/performed Overall Cognitive Status: Within Functional Limits for tasks assessed                      General Comments General comments (skin integrity, edema, etc.): Pt is somewhat reluctant to talk to PT but is able to walk on [facility] with cues for safety and safe on stairs with one rail.  He will need assistance at home and is not clear about just how much assistance he has    Exercises        Assessment/Plan    PT Assessment All further PT needs can be met in the next venue of care  PT Diagnosis Generalized weakness;Acute pain   PT Problem List Decreased strength;Decreased activity tolerance;Decreased balance;Decreased mobility;Decreased coordination;Decreased safety awareness;Decreased knowledge of precautions;Decreased skin integrity;Pain  PT Treatment Interventions     PT Goals (Current goals can be found in the Care Plan section) Acute Rehab PT Goals Patient Stated Goal: to get home and walk PT Goal Formulation: With patient Time For Goal Achievement: 05/13/14 Potential to Achieve Goals: Good    Frequency     Barriers to discharge        Co-evaluation               End of Session Equipment Utilized During Treatment: Gait belt Activity Tolerance: Patient tolerated treatment well Patient left: in chair;with call bell/phone within reach;with chair alarm set Nurse Communication: Mobility status         Time: 3094-0768 PT Time Calculation (min) (ACUTE ONLY): 27 min   Charges:   PT Evaluation $Initial PT Evaluation Tier I: 1 Procedure PT Treatments $Gait Training: 8-22 mins   PT G CodesRamond Dial 05-27-14, 10:20 AM  Mee Hives, PT MS Acute Rehab Dept. Number: [phone]

## 2014-05-06 NOTE — Progress Notes (Signed)
Patient ID: James Stevens, male   DOB: 04/11/1960, 54 y.o.   MRN: [id number]     Subjective: 1 Day Post-Op Procedure(s) (LRB): BILATERAL LUMBAR LAMINECTOMY L4-5 AND L5-S1 (N/A) Awake, alert and oriented x 4. Can you send a note to my probation officer, I may not be able to be at my probation hearing Next Monday, 05/12/2014. My leg pain is gone, just pain where the surgery was done.  Patient reports pain as mild.    Objective:   VITALS:  Temp:  [98.1 F (36.7 C)-99 F (37.2 C)] 98.4 F (36.9 C) (04/26 0614) Pulse Rate:  [69-94] 76 (04/26 0614) Resp:  [12-24] 16 (04/26 0614) BP: (101-158)/(52-95) 109/52 mmHg (04/26 0614) SpO2:  [97 %-100 %] 97 % (04/26 [id number])  Neurologically intact ABD soft Neurovascular intact Sensation intact distally Intact pulses distally Dorsiflexion/Plantar flexion intact Incision: no drainage No cellulitis present   LABS  Recent Labs  05/05/14 0631  HGB 14.8  WBC 9.4  PLT 215    Recent Labs  05/05/14 0631  NA 137  K 4.0  CL 105  CO2 22  BUN 16  CREATININE 1.24  GLUCOSE 98   No results for input(s): LABPT, INR in the last 72 hours.   Assessment/Plan: 1 Day Post-Op Procedure(s) (LRB): BILATERAL LUMBAR LAMINECTOMY L4-5 AND L5-S1 (N/A)  Advance diet Up with therapy D/C IV fluids Discharge home with home health this afternoon, 2 PM. I will check on his progress this afternoon about 1230 and  Be sure he remains comfortable with going home today. Orders and prescriptions on chart.  Davisha Linthicum E 05/06/2014, 8:47 AM

## 2014-05-06 NOTE — Progress Notes (Signed)
CARE MANAGEMENT NOTE 05/06/2014  Patient:  James Stevens, James Stevens   Account Number:  [id number]  Date Initiated:  05/06/2014  Documentation initiated by:  Lorne Skeens  Subjective/Objective Assessment:   Patient was admitted for a bilateral lumbar laminectomy. Lives at home alone.     Action/Plan:   Will follow for discharge needs.   Anticipated DC Date:  05/06/2014   Anticipated DC Plan:  [place]  CM consult      Choice offered to / List presented to:  C-1 Patient   DME arranged  3-N-1  Vassie Moselle      DME agency  [place].        Status of service:  In process, will continue to follow Medicare Important Message given?   (If response is "NO", the following Medicare IM given date fields will be blank) Date Medicare IM given:   Medicare IM given by:   Date Additional Medicare IM given:   Additional Medicare IM given by:    Discharge Disposition:    Per UR Regulation:    If discussed at Long Length of Stay Meetings, dates discussed:    Comments:  05/06/14 [place] RN, MSN, CM-  Met with patient to discuss discharge needs. Patient has chosen [facility]. Miranda with [facility] was notified of referral, but states that patient is not able to get HHPT under Medicaid based on his diagnosis. The patient will be able to get a [facility] for a post-op check and safety eval, if Dr Flavia Shipper would like to order it. CM left a message with Dr Otho Ket office to notify him of this.  [facility] was notified of need for equipment prior to discharge home later today. CM awaits call back from Dr Otho Ket office regarding home health orders.

## 2014-05-06 NOTE — Evaluation (Signed)
Occupational Therapy Evaluation Patient Details Name: IVON OELKERS MRN: [id number] DOB: 1960-12-20 Today's Date: 05/06/2014    History of Present Illness 54 yo male with spinal stenosis and neurogenic claudication, now with L4-5, L5-S1 laminectomy and microdiscectomy   Clinical Impression   Pt admitted with the above diagnoses. Pt educated on techniques and AE for ADLs and completed as detailed below. PTA pt was setup-min A for ADLs. Pt reports he will have 24 hour assist at d/c. No further OT needs indicated at this time.      Follow Up Recommendations  Supervision/Assistance - 24 hour;No OT follow up    Equipment Recommendations       Recommendations for Other Services       Precautions / Restrictions Precautions Precautions: Back;Fall Precaution Booklet Issued: Yes (comment) Precaution Comments: able to state 3/3 precautions Restrictions Weight Bearing Restrictions: No      Mobility Bed Mobility Overal bed mobility: Needs Assistance Bed Mobility: Supine to Sit     Supine to sit: Min assist     General bed mobility comments: EOB awaiting d/c  Transfers Overall transfer level: Needs assistance Equipment used: Straight cane Transfers: Sit to/from Stand Sit to Stand: Min guard Stand pivot transfers: Min guard       General transfer comment: height of bed elevated to replicate home setup    Balance Overall balance assessment: Needs assistance Sitting-balance support: No upper extremity supported;Feet supported Sitting balance-Leahy Scale: Good   Postural control: Posterior lean Standing balance support: Single extremity supported;During functional activity Standing balance-Leahy Scale: Fair Standing balance comment: pt is recovering LE strength since his back surgery for LE pain                            ADL Overall ADL's : Needs assistance/impaired Eating/Feeding: Set up;Sitting   Grooming: Set up;Sitting   Upper Body Bathing:  Set up;Sitting   Lower Body Bathing: Minimal assistance;Sit to/from stand   Upper Body Dressing : Set up;Sitting   Lower Body Dressing: Min guard;Minimal assistance;Sit to/from stand   Toilet Transfer: Min guard;Ambulation;BSC (cane)   Toileting- Clothing Manipulation and Hygiene: Minimal assistance;Sit to/from stand   Tub/ Shower Transfer: Min guard;Ambulation;Shower seat (cane)   Functional mobility during ADLs: Min guard;Cane General ADL Comments: Educated pt on techniques and AE for completion of ADLs with back precautions. Pt with good recall. Pt completed in-room ambulation with cane at min guard level.     Vision     Perception     Praxis      Pertinent Vitals/Pain Pain Assessment: 0-10 Pain Score: 4  Pain Location: back Pain Intervention(s): Monitored during session;Repositioned     Hand Dominance Right   Extremity/Trunk Assessment Upper Extremity Assessment Upper Extremity Assessment: Overall WFL for tasks assessed   Lower Extremity Assessment Lower Extremity Assessment: Defer to PT evaluation   Cervical / Trunk Assessment Cervical / Trunk Assessment: Normal   Communication Communication Communication: Other (comment) (speech is a bit unclear at times)   Cognition Arousal/Alertness: Awake/alert Behavior During Therapy: WFL for tasks assessed/performed Overall Cognitive Status: Within Functional Limits for tasks assessed                     General Comments       Exercises       Shoulder Instructions      Home Living Family/patient expects to be discharged to:: Private residence Living Arrangements: Alone Available Help at Discharge: Family;Home  health;Friend(s) Type of Home: House Home Access: Stairs to enter Entergy CorporationEntrance Stairs-Number of Steps: 4 Entrance Stairs-Rails: Left Home Layout: One level     Bathroom Shower/Tub: Chief Strategy OfficerTub/shower unit   Bathroom Toilet: Standard     Home Equipment: Cane - single point;Walker - 2  wheels;Bedside commode;Crutches;Adaptive equipment;Electric scooter Adaptive Equipment: Reacher;Sock aid;Long-handled shoe horn;Long-handled sponge;Other (Comment) (folding chair he uses in shower with nonslip mat) Additional Comments: 3n1 delivered to room      Prior Functioning/Environment Level of Independence: Needs assistance  Gait / Transfers Assistance Needed: used cane for household distance; power chair for community distances ADL's / Homemaking Assistance Needed: has HHA who provides light assist with bathing/dressing; "They clean my backside"   Comments: used crutches due to claudication PTA    OT Diagnosis:     OT Problem List:     OT Treatment/Interventions:      OT Goals(Current goals can be found in the care plan section) Acute Rehab OT Goals Patient Stated Goal: to get home and walk  OT Frequency:     Barriers to D/C:            Co-evaluation              End of Session Equipment Utilized During Treatment: Other (comment) (cane)  Activity Tolerance: Patient tolerated treatment well Patient left: with call bell/phone within reach;Other (comment) (EOB awaiting d/c)   Time: [id number] OT Time Calculation (min): 11 min Charges:  OT General Charges $OT Visit: 1 Procedure OT Evaluation $Initial OT Evaluation Tier I: 1 Procedure G-Codes:    Pilar GrammesMathews, Mandeep Ferch H 05/06/2014, 12:52 PM

## 2014-05-06 NOTE — Progress Notes (Signed)
Patient discharged home with family. Escorted to car by staff.

## 2014-05-06 NOTE — Progress Notes (Signed)
UR complete.  Mersadez Linden RN, MSN 

## 2014-05-06 NOTE — Progress Notes (Signed)
Discharge instructions discussed with patient. Prescriptions given to patient. Patient's money in security office was counted by Charity fundraiserN and nurse tech Eston EstersMolly Nash. Money given to patient. Patient states he understands discharge instructions and prescriptions. IV removed.

## 2014-05-07 ENCOUNTER — Encounter (HOSPITAL_COMMUNITY): Payer: Self-pay | Admitting: Specialist

## 2014-05-22 NOTE — Discharge Summary (Signed)
Physician Discharge Summary      Patient ID: James Stevens MRN: [id number] DOB/AGE: Jul 18, 1960 54 y.o.  Admit date: 05/05/2014 Discharge date: 05/06/2014  Admission Diagnoses:  Principal Problem:   Spinal stenosis, lumbar region, with neurogenic claudication Active Problems:   HNP (herniated nucleus pulposus), lumbar   Discharge Diagnoses:  Same  Past Medical History  Diagnosis Date  . Chronic back pain     and right hip  . GERD (gastroesophageal reflux disease)     occ tums  . Arthritis   . Hip pain   . Schizophrenia   . Bipolar affective   . 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 Depression   . Headache(784.0)     migraines  . Shortness of breath dyspnea     "if I walk a long way"    Surgeries: Procedure(s): BILATERAL LUMBAR LAMINECTOMY L4-5 AND L5-S1 on 05/05/2014   Consultants:    Discharged Condition: Improved  Hospital Course: James Stevens is an 54 y.o. male who was admitted 05/05/2014 with a chief complaint of No chief complaint on file. , and found to have a diagnosis of Spinal stenosis, lumbar region, with neurogenic claudication.  He was brought to the operating room on 05/05/2014 and underwent the above named procedures.    He was given perioperative antibiotics:  Anti-infectives    Start     Dose/Rate Route Frequency Ordered Stop   05/05/14 1415  ceFAZolin (ANCEF) IVPB 1 g/50 mL premix     1 g 100 mL/hr over 30 Minutes Intravenous Every 8 hours 05/05/14 1411 05/05/14 2256   05/05/14 0600  ceFAZolin (ANCEF) IVPB 2 g/50 mL premix     2 g 100 mL/hr over 30 Minutes Intravenous On call to O.R. 05/04/14 1443 05/05/14 4782    POD#1 he was alert and oriented x 4. No complaints of leg pain, his leg pain was improved. Taking and tolerating po medications and diet. He ambulated with minimal assistance with PT and OT indicated that he did well with  transitions to standing and walking. His  incision was dry, he was discharged home on POD#1  He was given sequential compression devices and early ambulation for DVT prophylaxis.  He benefited maximally from their hospital stay and there were no complications.    Recent vital signs:  Filed Vitals:   05/06/14 0942  BP: 100/69  Pulse: 68  Temp: 98.3 F (36.8 C)  Resp: 20    Recent laboratory studies:  Results for orders placed or performed during the hospital encounter of 05/05/14  Surgical pcr screen  Result Value Ref Range   MRSA, PCR NEGATIVE NEGATIVE   Staphylococcus aureus NEGATIVE NEGATIVE  CBC  Result Value Ref Range   WBC 9.4 4.0 - 10.5 K/uL   RBC 4.49 4.22 - 5.81 MIL/uL   Hemoglobin 14.8 13.0 - 17.0 g/dL   HCT 95.6 21.3 - 08.6 %   MCV 94.4 78.0 - 100.0 fL   MCH 33.0 26.0 - 34.0 pg   MCHC 34.9 30.0 - 36.0 g/dL   RDW 57.8 46.9 - 62.9 %   Platelets 215 150 - 400 K/uL  Comprehensive metabolic panel  Result Value Ref Range   Sodium 137 135 - 145 mmol/L   Potassium 4.0 3.5 - 5.1 mmol/L   Chloride 105 96 - 112 mmol/L   CO2 22 19 - 32 mmol/L   Glucose, Bld 98 70 - 99 mg/dL  BUN 16 6 - 23 mg/dL   Creatinine, Ser 1.611.24 0.50 - 1.35 mg/dL   Calcium 9.3 8.4 - 09.610.5 mg/dL   Total Protein 6.7 6.0 - 8.3 g/dL   Albumin 3.5 3.5 - 5.2 g/dL   AST 29 0 - 37 U/L   ALT 38 0 - 53 U/L   Alkaline Phosphatase 61 39 - 117 U/L   Total Bilirubin 0.4 0.3 - 1.2 mg/dL   GFR calc non Af Amer 65 (L) >90 mL/min   GFR calc Af Amer 75 (L) >90 mL/min   Anion gap 10 5 - 15    Discharge Medications:     Medication List    TAKE these medications        aspirin EC 325 MG tablet  Take 1 tablet (325 mg total) by mouth 2 (two) times daily.     docusate sodium 100 MG capsule  Commonly known as:  COLACE  Take 1 capsule (100 mg total) by mouth 2 (two) times daily as needed for mild constipation.     docusate sodium 100 MG capsule  Commonly known as:  COLACE  Take 1 capsule (100 mg total) by mouth 2 (two) times daily.     ferrous  sulfate 325 (65 FE) MG tablet  Take 1 tablet (325 mg total) by mouth 3 (three) times daily after meals.     HYDROcodone-acetaminophen 5-325 MG per tablet  Commonly known as:  NORCO/VICODIN  Take 2 tablets by mouth every 4 (four) hours as needed for moderate pain or severe pain.     HYDROcodone-acetaminophen 5-325 MG per tablet  Commonly known as:  NORCO/VICODIN  Take 2 tablets by mouth every 4 (four) hours as needed for moderate pain or severe pain.     oxyCODONE-acetaminophen 5-325 MG per tablet  Commonly known as:  PERCOCET/ROXICET  Take 1 tablet by mouth every 4 (four) hours as needed for severe pain.     oxyCODONE-acetaminophen 5-325 MG per tablet  Commonly known as:  PERCOCET/ROXICET  Take 1-2 tablets by mouth every 4 (four) hours as needed for moderate pain.     tiZANidine 4 MG capsule  Commonly known as:  ZANAFLEX  Take 1 capsule (4 mg total) by mouth as needed for muscle spasms.     tiZANidine 4 MG tablet  Commonly known as:  ZANAFLEX  Take 1 tablet (4 mg total) by mouth as needed (muscular spasm).     traMADol 50 MG tablet  Commonly known as:  ULTRAM  Take 50 mg by mouth every 6 (six) hours as needed for moderate pain or severe pain.     traZODone 100 MG tablet  Commonly known as:  DESYREL  Take 100 mg by mouth at bedtime as needed for sleep.        Diagnostic Studies: Dg Lumbar Spine 1 View  05/05/2014   CLINICAL DATA:  L4-5 and L5-S1 laminectomy.  EXAM: DG C-ARM 61-120 MIN; LUMBAR SPINE - 1 VIEW  FLUOROSCOPY TIME:  If the device does not provide the exposure index:  Fluoroscopy Time (in minutes and seconds):  0 minutes 2 seconds.  Number of Acquired Images:  2  COMPARISON:  MR lumbar spine 03/25/2014.  FINDINGS: Two intraoperative fluoroscopic spot views of the lumbosacral junction are submitted. The first image, taken at 1005 hours, shows a surgical instrument tip projecting posterior to the L5 inferior endplate. The second image, taken at 1015 hours, shows surgical  instrument tips projecting posterior to the L5 superior and inferior endplates,  respectively.  IMPRESSION: Intraoperative visualization for L4-5 and L5-S1 laminectomies.   Electronically Signed   By: Leanna BattlesMelinda  Blietz M.D.   On: 05/05/2014 12:52   Dg C-arm 1-60 Min  05/05/2014   CLINICAL DATA:  L4-5 and L5-S1 laminectomy.  EXAM: DG C-ARM 61-120 MIN; LUMBAR SPINE - 1 VIEW  FLUOROSCOPY TIME:  If the device does not provide the exposure index:  Fluoroscopy Time (in minutes and seconds):  0 minutes 2 seconds.  Number of Acquired Images:  2  COMPARISON:  MR lumbar spine 03/25/2014.  FINDINGS: Two intraoperative fluoroscopic spot views of the lumbosacral junction are submitted. The first image, taken at 1005 hours, shows a surgical instrument tip projecting posterior to the L5 inferior endplate. The second image, taken at 1015 hours, shows surgical instrument tips projecting posterior to the L5 superior and inferior endplates, respectively.  IMPRESSION: Intraoperative visualization for L4-5 and L5-S1 laminectomies.   Electronically Signed   By: Leanna BattlesMelinda  Blietz M.D.   On: 05/05/2014 12:52    Disposition: 01-Home or Self Care      Discharge Instructions    Call MD / Call 911    Complete by:  As directed   If you experience chest pain or shortness of breath, CALL 911 and be transported to the hospital emergency room.  If you develope a fever above 101 F, pus (white drainage) or increased drainage or redness at the wound, or calf pain, call your surgeon's office.     Constipation Prevention    Complete by:  As directed   Drink plenty of fluids.  Prune juice may be helpful.  You may use a stool softener, such as Colace (over the counter) 100 mg twice a day.  Use MiraLax (over the counter) for constipation as needed.     Diet - low sodium heart healthy    Complete by:  As directed      Discharge instructions    Complete by:  As directed   No lifting greater than 10 lbs. Avoid bending, stooping and  twisting. Walk in house for first week them may start to get out slowly increasing distance up to one half mile by 3 weeks post op. Keep incision dry for 3 days, may use tegaderm or similar water impervious dressing.     Driving restrictions    Complete by:  As directed   No driving for 2 weeks     Increase activity slowly as tolerated    Complete by:  As directed      Lifting restrictions    Complete by:  As directed   No lifting for 6 weeks           Follow-up Information    Follow up with NITKA,JAMES E, MD In 2 weeks.   Specialty:  Orthopedic Surgery   Contact information:   [address] [place] [address] [phone]        Signed: Kerrin ChampagneITKA,JAMES E 05/22/2014, 7:24 PM

## 2014-07-28 ENCOUNTER — Encounter: Payer: Self-pay | Admitting: Vascular Surgery

## 2014-07-31 ENCOUNTER — Encounter: Payer: Self-pay | Admitting: Vascular Surgery

## 2014-07-31 ENCOUNTER — Ambulatory Visit (INDEPENDENT_AMBULATORY_CARE_PROVIDER_SITE_OTHER): Payer: Medicaid Other | Admitting: Vascular Surgery

## 2014-07-31 ENCOUNTER — Ambulatory Visit (HOSPITAL_COMMUNITY)
Admission: RE | Admit: 2014-07-31 | Discharge: 2014-07-31 | Disposition: A | Discharge status: Home / Self Care | Payer: Medicaid Other | Source: Ambulatory Visit | Attending: Vascular Surgery | Admitting: Vascular Surgery

## 2014-07-31 VITALS — BP 111/76 | HR 86 | Ht 70.0 in | Wt 136.5 lb

## 2014-07-31 DIAGNOSIS — I739 Peripheral vascular disease, unspecified: Secondary | ICD-10-CM | POA: Diagnosis present

## 2014-07-31 NOTE — Addendum Note (Signed)
Addended by: Adria Dill L on: 07/31/2014 04:14 PM   Modules accepted: Orders

## 2014-07-31 NOTE — Progress Notes (Signed)
HISTORY AND PHYSICAL     CC:  F/u for aortobifemoral bypass grafting Referring Provider:  Dorrene German, MD  HPI: This is a 54 y.o. male who is s/p aortobifemoral bypass grafting on 10/07/13 who returns today for f/u.  he denies any claudication symptoms. He states he is able to walk as much as he wishes however he does get short of breath after walking long distances. He continues to smoke. He was counseled for greater than 3 minutes today around smoking cessation.  The pt is on a statin for his cholesterol.  He is on a beta blocker for his HTN.  he also takes aspirin daily.    Past Medical History   Diagnosis  Date   .  Chronic back pain         and right hip   .  GERD (gastroesophageal reflux disease)         occ tums   .  Arthritis     .  Hip pain     .  Schizophrenia     .  Bipolar affective     .  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  Depression     .  Headache(784.0)         migraines     Review of systems: He denies chest pain. He does get short of breath with exertion.    Past Surgical History   Procedure  Laterality  Date   .  Mandible fracture surgery       .  Esophagogastroduodenoscopy    09/06/2011       Procedure: ESOPHAGOGASTRODUODENOSCOPY (EGD);  Surgeon: Vertell Novak., MD;  Location: [facility];  Service: Endoscopy;  Laterality: N/A;   .  Savory dilation    09/06/2011       Procedure: SAVORY DILATION;  Surgeon: Vertell Novak., MD;  Location: [facility];  Service: Endoscopy;  Laterality: N/A;   .  Esophageal dilation       .  Total hip arthroplasty  Left  04/17/2012       Procedure: LEFT TOTAL HIP ARTHROPLASTY ANTERIOR APPROACH;  Surgeon: Kathryne Hitch, MD;  Location: [facility] OR;  Service: Orthopedics;  Laterality: Left;   .  Joint replacement           rt hip replacement, and  LT hip   .  Fracture surgery           left fingers # 4,5   .  Aorta - bilateral femoral artery  bypass graft  N/A  10/07/2013       Procedure: AORTA BIFEMORAL BYPASS GRAFT;  Surgeon: Sherren Kerns, MD;  Location: [facility] OR;  Service: Vascular;  Laterality: N/A;       Allergies   Allergen  Reactions   .  Ibuprofen  Other (See Comments)       Gastric irritation   .  Tramadol  Itching       Current Outpatient Prescriptions   Medication  Sig  Dispense  Refill   .  aspirin EC 325 MG tablet  Take 1 tablet (325 mg total) by mouth 2 (two) times daily.  45 tablet  0   .  atorvastatin (LIPITOR) 80 MG tablet  Take 80 mg by mouth daily.       .  carvedilol (COREG) 6.25 MG tablet  Take 6.25 mg by  mouth 2 (two) times daily with a meal.       .  docusate sodium (COLACE) 100 MG capsule  Take 1 capsule (100 mg total) by mouth 2 (two) times daily as needed for mild constipation.       .  ferrous sulfate 325 (65 FE) MG tablet  Take 1 tablet (325 mg total) by mouth 3 (three) times daily after meals.  90 tablet  0   .  HYDROcodone-acetaminophen (NORCO/VICODIN) 5-325 MG per tablet  Take 2 tablets by mouth every 4 (four) hours as needed for moderate pain or severe pain.  6 tablet  0   .  HYDROcodone-acetaminophen (NORCO/VICODIN) 5-325 MG per tablet  Take 2 tablets by mouth every 4 (four) hours as needed for moderate pain or severe pain.  6 tablet  0   .  magnesium hydroxide (MILK OF MAGNESIA) 400 MG/5ML suspension  Take 30 mLs by mouth daily as needed for mild constipation.       .  meloxicam (MOBIC) 7.5 MG tablet  Take 7.5 mg by mouth daily.       Marland Kitchen  oxyCODONE-acetaminophen (PERCOCET/ROXICET) 5-325 MG per tablet  Take 1 tablet by mouth every 4 (four) hours as needed for severe pain.  30 tablet  0   .  tiZANidine (ZANAFLEX) 4 MG capsule  Take 4 mg by mouth as needed for muscle spasms.       .  traZODone (DESYREL) 100 MG tablet  Take 100 mg by mouth at bedtime.          No current facility-administered medications for this visit.      Facility-Administered Medications Ordered in Other Visits   Medication   Dose  Route  Frequency  Provider  Last Rate  Last Dose   .  lactated ringers infusion       Continuous PRN  Nils Pyle, CRNA         .  lactated ringers infusion       Continuous PRN  Nils Pyle, CRNA             Family History   Problem  Relation  Age of Onset   .  Diabetes  Mother     .  Diabetes  Father     .  Diabetes  Other         History      Social History   .  Marital Status:  Divorced       Spouse Name:  N/A       Number of Children:  N/A   .  Years of Education:  N/A      Occupational History   .  Not on file.      Social History Main Topics   .  Smoking status:  Current Every Day Smoker -- 0.25 packs/day for 34 years       Types:  Cigarettes   .  Smokeless tobacco:  Never Used   .  Alcohol Use:  Yes         Comment: once a week    .  Drug Use:  2.00 per week       Special:  Cocaine   .  Sexual Activity:  Not on file      Other Topics  Concern   .  Not on file      Social History Narrative    PHYSICAL EXAMINATION:     Filed Vitals:   07/31/14 1518  BP: 111/76  Pulse: 86  Height: 5\' 10"  (1.778 m)  Weight: 136 lb 8 oz (61.916 kg)  SpO2: 98%   General:  WDWN in NAD Gait: Not observed HENT: WNL, normocephalic Pulmonary: normal non-labored breathing , without Rales, rhonchi,  wheezing Cardiac: RRR, without  Murmurs, rubs or gallops; without carotid bruits Abdomen: soft, NT, no masses Skin: without rashes, without ulcers  Vascular Exam/Pulses: 2+ femoral pulses left posterior tibial pulse 2+ right dorsalis pedis pulse 2+  Extremities: without ischemic changes, without Gangrene , without cellulitis; without open wounds;  Musculoskeletal: no muscle wasting or atrophy       Neurologic: A&O X 3; Appropriate Affect ; SENSATION: normal; MOTOR FUNCTION:  moving all extremities equally. Speech is fluent/normal   Pt meds includes: Statin:  Yes.   Beta Blocker:  Yes.   Aspirin: Yes.   ACEI: No. ARB:  No. Other Antiplatelet/Anticoagulant:  No.     ASSESSMENT/PLAN:: 54 y.o. male who is s/p aortobifemoral bypass grafting patent bypass.   Patient will try to quit smoking. He will follow-up in one year with repeat ABIs.  Fabienne Bruns, MD [facility] Office: [phone] Pager: [phone]

## 2015-07-20 ENCOUNTER — Ambulatory Visit (HOSPITAL_COMMUNITY)
Admission: EM | Admit: 2015-07-20 | Discharge: 2015-07-20 | Disposition: A | Discharge status: Home / Self Care | Payer: Medicaid Other | Attending: Emergency Medicine | Admitting: Emergency Medicine

## 2015-07-20 ENCOUNTER — Encounter (HOSPITAL_COMMUNITY): Payer: Self-pay | Admitting: Emergency Medicine

## 2015-07-20 DIAGNOSIS — L0390 Cellulitis, unspecified: Secondary | ICD-10-CM

## 2015-07-20 DIAGNOSIS — H00016 Hordeolum externum left eye, unspecified eyelid: Secondary | ICD-10-CM

## 2015-07-20 MED ORDER — ERYTHROMYCIN 5 MG/GM OP OINT: TOPICAL_OINTMENT | OPHTHALMIC | Status: DC

## 2015-07-20 MED ORDER — TETRACAINE HCL 0.5 % OP SOLN
OPHTHALMIC | Status: AC
  Filled 2015-07-20: qty 2

## 2015-07-20 MED ORDER — SULFAMETHOXAZOLE-TRIMETHOPRIM 800-160 MG PO TABS: 2.0000 | ORAL_TABLET | Freq: Two times a day (BID) | ORAL | Status: DC

## 2015-07-20 NOTE — ED Provider Notes (Signed)
HPI  SUBJECTIVE:  James AnchorsDaniel J Stevens is a 55 y.o. male who presents with left eyelid pain, swelling, edema for the past 11 days, and infraorbital swelling for the past several days.Marland Kitchen. He reports sticky eye drainage, blurry vision that clears with blinking. He reports pain with extraocular movements. Patient states that he has been rubbing his eyes. States that the swelling started after trying an unknown "stye cream". Symptoms are worse with blinking, better with warm compresses. He has also tried an unknown eyedrop for this. He reports intermittent left-sided headaches that last approximately 15-20 minutes, which resolved on their own. He denies halos around lights, photophobia, pain worse in the dark, supraorbital swelling. He denies any trauma to his eye, sick contacts with conjunctivitis, he does not wear glasses or contacts. He is a past medical history negative for diabetes, hypertension, glaucoma. PMD: Dr. Dalbert GarnetAbuvere.   Past Medical History  Diagnosis Date  . Chronic back pain     and right hip  . GERD (gastroesophageal reflux disease)     occ tums  . Arthritis   . Hip pain   . Schizophrenia (HCC)   . Bipolar affective (HCC)   . 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 Depression   . Headache(784.0)     migraines  . Shortness of breath dyspnea     "if I walk a long way"    Past Surgical History  Procedure Laterality Date  . Mandible fracture surgery    . Esophagogastroduodenoscopy  09/06/2011    Procedure: ESOPHAGOGASTRODUODENOSCOPY (EGD);  Surgeon: Vertell NovakJames L Edwards Jr., MD;  [facility];  Service: Endoscopy;  Laterality: N/A;  . Savory dilation  09/06/2011    Procedure: SAVORY DILATION;  Surgeon: Vertell NovakJames L Edwards Jr., MD;  [facility];  Service: Endoscopy;  Laterality: N/A;  . Esophageal dilation    . Total hip arthroplasty Left 04/17/2012    Procedure: LEFT TOTAL HIP ARTHROPLASTY ANTERIOR APPROACH;   Surgeon: Kathryne Hitchhristopher Y Blackman, MD;  [facility];  Service: Orthopedics;  Laterality: Left;  . Joint replacement Bilateral     rt hip replacement, and  LT hip  . Fracture surgery      left fingers # 4,5  . Aorta - bilateral femoral artery bypass graft N/A 10/07/2013    Procedure: AORTA BIFEMORAL BYPASS GRAFT;  Surgeon: Sherren Kernsharles E Fields, MD;  [facility];  Service: Vascular;  Laterality: N/A;  . Lumbar laminectomy N/A 05/05/2014    Procedure: BILATERAL LUMBAR LAMINECTOMY L4-5 AND L5-S1;  Surgeon: Kerrin ChampagneJames E Nitka, MD;  Location: [facility] OR;  Service: Orthopedics;  Laterality: N/A;    Family History  Problem Relation Age of Onset  . Diabetes Mother   . Diabetes Father   . Diabetes Other   . Diabetes Brother     Social History  Substance Use Topics  . Smoking status: Current Every Day Smoker -- 0.20 packs/day for 34 years    Types: Cigarettes  . Smokeless tobacco: Never Used  . Alcohol Use: Yes     Comment: once a week     No current facility-administered medications for this encounter.  Current outpatient prescriptions:  .  aspirin EC 325 MG tablet, Take 1 tablet (325 mg total) by mouth 2 (two) times daily., Disp: 45 tablet, Rfl: 0 .  docusate sodium (COLACE) 100 MG capsule, Take 1 capsule (100 mg total) by mouth 2 (two) times daily as needed for mild constipation.,  Disp: , Rfl:  .  erythromycin ophthalmic ointment, 1 cm ribbon to affected eyelid qid x 10 days, Disp: 5 g, Rfl: 0 .  ferrous sulfate 325 (65 FE) MG tablet, Take 1 tablet (325 mg total) by mouth 3 (three) times daily after meals., Disp: 90 tablet, Rfl: 0 .  sulfamethoxazole-trimethoprim (BACTRIM DS,SEPTRA DS) 800-160 MG tablet, Take 2 tablets by mouth 2 (two) times daily., Disp: 40 tablet, Rfl: 0 .  tiZANidine (ZANAFLEX) 4 MG capsule, Take 1 capsule (4 mg total) by mouth as needed for muscle spasms., Disp: 60 capsule, Rfl: 1 .  traZODone (DESYREL) 100 MG tablet, Take 100 mg by mouth at bedtime as needed for sleep. ,  Disp: , Rfl:  .  [DISCONTINUED] oxyCODONE-acetaminophen (PERCOCET/ROXICET) 5-325 MG per tablet, Take 1-2 tablets by mouth every 4 (four) hours as needed for moderate pain. (Patient not taking: Reported on 07/31/2014), Disp: 60 tablet, Rfl: 0  Facility-Administered Medications Ordered in Other Encounters:  .  lactated ringers infusion, , , Continuous PRN, Nils Pyle, CRNA .  lactated ringers infusion, , , Continuous PRN, Nils Pyle, CRNA  Allergies  Allergen Reactions  . Ibuprofen Other (See Comments)    Gastric irritation     ROS  As noted in HPI.   Physical Exam  BP 126/77 mmHg  Pulse 105  Temp(Src) 98 F (36.7 C) (Oral)  Resp 16  SpO2 100%  Constitutional: Well developed, well nourished, no acute distress Eyes:  EOMI, conjunctiva normal bilaterally. Positive tender swelling superior left eyelid, no pointing. Positive tender swelling inferior to the left eye, minimal eyelid swelling. No periorbital swelling. No direct photophobia. Positive consensual photophobia. Has full EOMs. No foreign body seen on lid eversion. Fluorescein stain negative for abrasion.    Visual Acuity  Right Eye Distance: 20/40 Left Eye Distance: 20/40 Bilateral Distance: 20/30  Right Eye Near:   Left Eye Near:    Bilateral Near:    HENT: Normocephalic, atraumatic,mucus membranes moist Respiratory: Normal inspiratory effort Cardiovascular: Normal rate GI: nondistended skin: No rash, skin intact Musculoskeletal: no deformities Neurologic: Alert & oriented x 3, no focal neuro deficits Psychiatric: Speech and behavior appropriate   ED Course   Medications - No data to display  No orders of the defined types were placed in this encounter.    No results found for this or any previous visit (from the past 24 hour(s)). No results found.  ED Clinical Impression  Stye, left  Cellulitis, unspecified cellulitis site, unspecified extremity site, unspecified laterality   ED  Assessment/Plan  No evidence of foreign body, glaucoma, abrasion, orbital cellulitis. He may have some periorbital cellulitis versus irritation from the unknown stye cream. His visual acuity is equal, he has no restriction of eye movement, proptosis, no fevers. however given the extent of the swelling, reported pain with extraocular movements, and the photophobia, concern for serious cause of his sx.   Discussed case with Dr. Darrol Poke staff at [facility]. They will see him today at 1:20 p.m. We'll send home with erythromycin ointment, Bactrim to cover any periorbital cellulitis, advised patient to wait and see what ophthalmology recommendations are prior to filling these prescriptions.  Discussed signs and symptoms that should prompt return to emergency room. Patient agrees with plan   *This clinic note was created using Dragon dictation software. Therefore, there may be occasional mistakes despite careful proofreading.  ?   Domenick Gong, MD 07/20/15 [id number]

## 2015-07-20 NOTE — ED Notes (Signed)
Patient has multiple concerns: left eye stye for 11 days.  Has been using otc stye medicine.  Eyelids are swollen.   Patient has concerns for when swallowing water, feeling it gets "stuck".  Patient has had esophagus stretched approx 2 years ago, cannot remember the name of the provider .

## 2015-07-20 NOTE — Discharge Instructions (Signed)
I have made an appointment for you with the ophthalmologist today with Dr. Sherryll BurgerShah at 1:20 pm. Go to [facility] at the address listed.

## 2015-07-20 NOTE — ED Notes (Signed)
Discharge on hold.  Dr Chaney Mallingmortenson contacting specialist

## 2015-07-27 ENCOUNTER — Telehealth: Payer: Self-pay | Admitting: Vascular Surgery

## 2015-07-27 NOTE — Telephone Encounter (Signed)
Called patient to remind him of his appointment on 08/06/15 at 12:30.  Patient stated he would be here for the appointment.

## 2015-07-30 ENCOUNTER — Encounter: Payer: Self-pay | Admitting: Family

## 2015-08-06 ENCOUNTER — Encounter: Payer: Self-pay | Admitting: Family

## 2015-08-06 ENCOUNTER — Ambulatory Visit (INDEPENDENT_AMBULATORY_CARE_PROVIDER_SITE_OTHER): Payer: Medicaid Other | Admitting: Family

## 2015-08-06 ENCOUNTER — Ambulatory Visit (HOSPITAL_COMMUNITY)
Admission: RE | Admit: 2015-08-06 | Discharge: 2015-08-06 | Disposition: A | Discharge status: Home / Self Care | Payer: Medicaid Other | Source: Ambulatory Visit | Attending: Family | Admitting: Family

## 2015-08-06 VITALS — BP 113/76 | HR 94 | Temp 97.0°F | Resp 14 | Ht 70.0 in | Wt 135.0 lb

## 2015-08-06 DIAGNOSIS — F3189 Other bipolar disorder: Secondary | ICD-10-CM | POA: Insufficient documentation

## 2015-08-06 DIAGNOSIS — I739 Peripheral vascular disease, unspecified: Secondary | ICD-10-CM | POA: Diagnosis not present

## 2015-08-06 DIAGNOSIS — I7409 Other arterial embolism and thrombosis of abdominal aorta: Secondary | ICD-10-CM

## 2015-08-06 DIAGNOSIS — Z720 Tobacco use: Secondary | ICD-10-CM | POA: Diagnosis not present

## 2015-08-06 DIAGNOSIS — K219 Gastro-esophageal reflux disease without esophagitis: Secondary | ICD-10-CM | POA: Insufficient documentation

## 2015-08-06 DIAGNOSIS — R0989 Other specified symptoms and signs involving the circulatory and respiratory systems: Secondary | ICD-10-CM | POA: Diagnosis present

## 2015-08-06 DIAGNOSIS — Z95828 Presence of other vascular implants and grafts: Secondary | ICD-10-CM

## 2015-08-06 DIAGNOSIS — F209 Schizophrenia, unspecified: Secondary | ICD-10-CM | POA: Insufficient documentation

## 2015-08-06 DIAGNOSIS — F17200 Nicotine dependence, unspecified, uncomplicated: Secondary | ICD-10-CM

## 2015-08-06 NOTE — Progress Notes (Signed)
[facility] [place]   CC: Follow up peripheral artery occlusive disease  History of Present Illness James Stevens is a 55 y.o. male patient of Dr. Darrick Penna who is s/p aortobifemoral bypass grafting on 10/07/13. He returns today for follow up. He denies any claudication symptoms. He continues to smoke.  The pt is on a statin for his cholesterol. He is on a beta blocker for his HTN. he also takes aspirin daily. He states one leg became shorter than the other after his bilateral hip replacements. He notices that he has bilateral hip and calf pain after walking about 1/4 mile, relieved by rest. He denies non healing wounds in his feet/legs. He has a history of lumbar spine surgery.   Pt Diabetic: No Pt smoker: smoker  (1/3 ppd, started at age 28 yrs)  Pt meds include: Statin :No Betablocker: No ASA: Yes Other anticoagulants/antiplatelets: no  Past Medical History:  Diagnosis Date  . Arthritis   . Bipolar affective (HCC)   . Chronic back pain    and right hip  . Depression   . GERD (gastroesophageal reflux disease)    occ tums  . Headache(784.0)    migraines  . Hip pain   . 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 Schizophrenia (HCC)   . Shortness of breath dyspnea    "if I walk a long way"    Social History Social History  Substance Use Topics  . Smoking status: Current Every Day Smoker    Packs/day: 0.20    Years: 34.00    Types: Cigarettes  . Smokeless tobacco: Never Used  . Alcohol use Yes     Comment: once a week     Family History Family History  Problem Relation Age of Onset  . Diabetes Mother   . Diabetes Father   . Diabetes Other   . Diabetes Brother     Past Surgical History:  Procedure Laterality Date  . AORTA - BILATERAL FEMORAL ARTERY BYPASS GRAFT N/A 10/07/2013   Procedure: AORTA BIFEMORAL BYPASS GRAFT;  Surgeon: Sherren Kerns, MD;  Location: [facility] OR;   Service: Vascular;  Laterality: N/A;  . ESOPHAGEAL DILATION    . ESOPHAGOGASTRODUODENOSCOPY  09/06/2011   Procedure: ESOPHAGOGASTRODUODENOSCOPY (EGD);  Surgeon: Vertell Novak., MD;  Location: [facility];  Service: Endoscopy;  Laterality: N/A;  . FRACTURE SURGERY     left fingers # 4,5  . JOINT REPLACEMENT Bilateral    rt hip replacement, and  LT hip  . LUMBAR LAMINECTOMY N/A 05/05/2014   Procedure: BILATERAL LUMBAR LAMINECTOMY L4-5 AND L5-S1;  Surgeon: Kerrin Champagne, MD;  Location: [facility] OR;  Service: Orthopedics;  Laterality: N/A;  . MANDIBLE FRACTURE SURGERY    . SAVORY DILATION  09/06/2011   Procedure: SAVORY DILATION;  Surgeon: Vertell Novak., MD;  Location: [facility];  Service: Endoscopy;  Laterality: N/A;  . TOTAL HIP ARTHROPLASTY Left 04/17/2012   Procedure: LEFT TOTAL HIP ARTHROPLASTY ANTERIOR APPROACH;  Surgeon: Kathryne Hitch, MD;  Location: [facility] OR;  Service: Orthopedics;  Laterality: Left;    Allergies  Allergen Reactions  . Ibuprofen Other (See Comments)    Gastric irritation    Current Outpatient Prescriptions  Medication Sig Dispense Refill  . aspirin EC 325 MG tablet Take 1 tablet (325 mg total) by mouth 2 (two) times daily. 45 tablet 0  . docusate sodium (COLACE) 100 MG capsule Take 1  capsule (100 mg total) by mouth 2 (two) times daily as needed for mild constipation.    Marland Kitchen erythromycin ophthalmic ointment 1 cm ribbon to affected eyelid qid x 10 days 5 g 0  . ferrous sulfate 325 (65 FE) MG tablet Take 1 tablet (325 mg total) by mouth 3 (three) times daily after meals. 90 tablet 0  . sulfamethoxazole-trimethoprim (BACTRIM DS,SEPTRA DS) 800-160 MG tablet Take 2 tablets by mouth 2 (two) times daily. 40 tablet 0  . tiZANidine (ZANAFLEX) 4 MG capsule Take 1 capsule (4 mg total) by mouth as needed for muscle spasms. 60 capsule 1  . traZODone (DESYREL) 100 MG tablet Take 100 mg by mouth at bedtime as needed for sleep.      No current facility-administered  medications for this visit.    Facility-Administered Medications Ordered in Other Visits  Medication Dose Route Frequency Provider Last Rate Last Dose  . lactated ringers infusion    Continuous PRN Nils Pyle, CRNA      . lactated ringers infusion    Continuous PRN Nils Pyle, CRNA        ROS: See HPI for pertinent positives and negatives.   Physical Examination  Vitals:   08/06/15 1307  BP: 113/76  Pulse: 94  Resp: 14  Temp: 97 F (36.1 C)  SpO2: 100%  Weight: 135 lb (61.2 kg)  Height: 5\' 10"  (1.778 m)   Body mass index is 19.37 kg/m.  General: A&O x 3, WDWN. Gait: mildly antalgic Eyes: PERRLA. Pulmonary: CTAB, without wheezes , rales or rhonchi. Cardiac: regular rhythm, no detected murmur.         Carotid Bruits Right Left   Negative Negative  Aorta is not palpable. Radial pulses: 2+ palpable and =                           VASCULAR EXAM: Extremities without ischemic changes, without Gangrene; without open wounds.                                                                                                          LE Pulses Right Left       FEMORAL  2+ palpable  2+ palpable        POPLITEAL  not palpable   not palpable       POSTERIOR TIBIAL   palpable    palpable        DORSALIS PEDIS      ANTERIOR TIBIAL  palpable  palpable    Abdomen: soft, NT, no palpable masses. Skin: no rashes, no ulcers. Musculoskeletal: no muscle wasting or atrophy.  Neurologic: A&O X 3; Appropriate Affect ; SENSATION: normal; MOTOR FUNCTION:  moving all extremities equally, motor strength 5/5 throughout. Speech is fluent/normal. CN 2-12 intact.    ASSESSMENT: James Stevens is a 55 y.o. male who is s/p aortobifemoral bypass grafting on 10/07/13. The pain that he has in his hips with walking is not due to lack of arterial perfusion. His right ABI is  normal with biphasic waveforms and the left ABI has mild decrease in arterial perfusion with tri and biphasic  waveforms. Both TBI's are normal.  His primary atherosclerotic risk factor remains active smoking since age 67. Fortunately he does not have DM, he has a normal BMI, and walks a good part of his day.   PLAN:  Graduated walking program discussed and how to achieve.  The patient was counseled re smoking cessation and given several free resources re smoking cessation. Follow up in 1 year with ABI's.  I discussed in depth with the patient the nature of atherosclerosis, and emphasized the importance of maximal medical management including strict control of blood pressure, blood glucose, and lipid levels, obtaining regular exercise, and cessation of smoking.  The patient is aware that without maximal medical management the underlying atherosclerotic disease process will progress, limiting the benefit of any interventions.  The patient was given information about PAD including signs, symptoms, treatment, what symptoms should prompt the patient to seek immediate medical care, and risk reduction measures to take.  Charisse March, RN, MSN, FNP-C [facility] [place] Phone: [phone]  Clinic MD: Darrick Penna  08/06/15 1:12 PM

## 2015-08-06 NOTE — Patient Instructions (Signed)
Peripheral Vascular Disease Peripheral vascular disease (PVD) is a disease of the blood vessels that are not part of your heart and brain. A simple term for PVD is poor circulation. In most cases, PVD narrows the blood vessels that carry blood from your heart to the rest of your body. This can result in a decreased supply of blood to your arms, legs, and internal organs, like your stomach or kidneys. However, it most often affects a person's lower legs and feet. There are two types of PVD.  Organic PVD. This is the more common type. It is caused by damage to the structure of blood vessels.  Functional PVD. This is caused by conditions that make blood vessels contract and tighten (spasm). Without treatment, PVD tends to get worse over time. PVD can also lead to acute ischemic limb. This is when an arm or limb suddenly has trouble getting enough blood. This is a medical emergency. CAUSES Each type of PVD has many different causes. The most common cause of PVD is buildup of a fatty material (plaque) inside of your arteries (atherosclerosis). Small amounts of plaque can break off from the walls of the blood vessels and become lodged in a smaller artery. This blocks blood flow and can cause acute ischemic limb. Other common causes of PVD include:  Blood clots that form inside of blood vessels.  Injuries to blood vessels.  Diseases that cause inflammation of blood vessels or cause blood vessel spasms.  Health behaviors and health history that increase your risk of developing PVD. RISK FACTORS  You may have a greater risk of PVD if you:  Have a family history of PVD.  Have certain medical conditions, including:  High cholesterol.  Diabetes.  High blood pressure (hypertension).  Coronary heart disease.  Past problems with blood clots.  Past injury, such as burns or a broken bone. These may have damaged blood vessels in your limbs.  Buerger disease. This is caused by inflamed blood  vessels in your hands and feet.  Some forms of arthritis.  Rare birth defects that affect the arteries in your legs.  Use tobacco.  Do not get enough exercise.  Are obese.  Are age 50 or older. SIGNS AND SYMPTOMS  PVD may cause many different symptoms. Your symptoms depend on what part of your body is not getting enough blood. Some common signs and symptoms include:  Cramps in your lower legs. This may be a symptom of poor leg circulation (claudication).  Pain and weakness in your legs while you are physically active that goes away when you rest (intermittent claudication).  Leg pain when at rest.  Leg numbness, tingling, or weakness.  Coldness in a leg or foot, especially when compared with the other leg.  Skin or hair changes. These can include:  Hair loss.  Shiny skin.  Pale or bluish skin.  Thick toenails.  Inability to get or maintain an erection (erectile dysfunction). People with PVD are more prone to developing ulcers and sores on their toes, feet, or legs. These may take longer than normal to heal. DIAGNOSIS Your health care provider may diagnose PVD from your signs and symptoms. The health care provider will also do a physical exam. You may have tests to find out what is causing your PVD and determine its severity. Tests may include:  Blood pressure recordings from your arms and legs and measurements of the strength of your pulses (pulse volume recordings).  Imaging studies using sound waves to take pictures of   the blood flow through your blood vessels (Doppler ultrasound).  Injecting a dye into your blood vessels before having imaging studies using:  X-rays (angiogram or arteriogram).  Computer-generated X-rays (CT angiogram).  A powerful electromagnetic field and a computer (magnetic resonance angiogram or MRA). TREATMENT Treatment for PVD depends on the cause of your condition and the severity of your symptoms. It also depends on your age. Underlying  causes need to be treated and controlled. These include long-lasting (chronic) conditions, such as diabetes, high cholesterol, and high blood pressure. You may need to first try making lifestyle changes and taking medicines. Surgery may be needed if these do not work. Lifestyle changes may include:  Quitting smoking.  Exercising regularly.  Following a low-fat, low-cholesterol diet. Medicines may include:  Blood thinners to prevent blood clots.  Medicines to improve blood flow.  Medicines to improve your blood cholesterol levels. Surgical procedures may include:  A procedure that uses an inflated balloon to open a blocked artery and improve blood flow (angioplasty).  A procedure to put in a tube (stent) to keep a blocked artery open (stent implant).  Surgery to reroute blood flow around a blocked artery (peripheral bypass surgery).  Surgery to remove dead tissue from an infected wound on the affected limb.  Amputation. This is surgical removal of the affected limb. This may be necessary in cases of acute ischemic limb that are not improved through medical or surgical treatments. HOME CARE INSTRUCTIONS  Take medicines only as directed by your health care provider.  Do not use any tobacco products, including cigarettes, chewing tobacco, or electronic cigarettes. If you need help quitting, ask your health care provider.  Lose weight if you are overweight, and maintain a healthy weight as directed by your health care provider.  Eat a diet that is low in fat and cholesterol. If you need help, ask your health care provider.  Exercise regularly. Ask your health care provider to suggest some good activities for you.  Use compression stockings or other mechanical devices as directed by your health care provider.  Take good care of your feet.  Wear comfortable shoes that fit well.  Check your feet often for any cuts or sores. SEEK MEDICAL CARE IF:  You have cramps in your legs  while walking.  You have leg pain when you are at rest.  You have coldness in a leg or foot.  Your skin changes.  You have erectile dysfunction.  You have cuts or sores on your feet that are not healing. SEEK IMMEDIATE MEDICAL CARE IF:  Your arm or leg turns cold and blue.  Your arms or legs become red, warm, swollen, painful, or numb.  You have chest pain or trouble breathing.  You suddenly have weakness in your face, arm, or leg.  You become very confused or lose the ability to speak.  You suddenly have a very bad headache or lose your vision.   This information is not intended to replace advice given to you by your health care provider. Make sure you discuss any questions you have with your health care provider.   Document Released: 02/04/2004 Document Revised: 01/17/2014 Document Reviewed: 06/06/2013 Elsevier Interactive Patient Education 2016 Elsevier Inc[employer]     Steps to Quit Smoking  Smoking tobacco can be harmful to your health and can affect almost every organ in your body. Smoking puts you, and those around you, at risk for developing many serious chronic diseases. Quitting smoking is difficult, but it is one of   the best things that you can do for your health. It is never too late to quit. WHAT ARE THE BENEFITS OF QUITTING SMOKING? When you quit smoking, you lower your risk of developing serious diseases and conditions, such as:  Lung cancer or lung disease, such as COPD.  Heart disease.  Stroke.  Heart attack.  Infertility.  Osteoporosis and bone fractures. Additionally, symptoms such as coughing, wheezing, and shortness of breath may get better when you quit. You may also find that you get sick less often because your body is stronger at fighting off colds and infections. If you are pregnant, quitting smoking can help to reduce your chances of having a baby of low birth weight. HOW DO I GET READY TO QUIT? When you decide to quit smoking, create a plan to  make sure that you are successful. Before you quit:  Pick a date to quit. Set a date within the next two weeks to give you time to prepare.  Write down the reasons why you are quitting. Keep this list in places where you will see it often, such as on your bathroom mirror or in your car or wallet.  Identify the people, places, things, and activities that make you want to smoke (triggers) and avoid them. Make sure to take these actions:  Throw away all cigarettes at home, at work, and in your car.  Throw away smoking accessories, such as ashtrays and lighters.  Clean your car and make sure to empty the ashtray.  Clean your home, including curtains and carpets.  Tell your family, friends, and coworkers that you are quitting. Support from your loved ones can make quitting easier.  Talk with your health care provider about your options for quitting smoking.  Find out what treatment options are covered by your health insurance. WHAT STRATEGIES CAN I USE TO QUIT SMOKING?  Talk with your healthcare provider about different strategies to quit smoking. Some strategies include:  Quitting smoking altogether instead of gradually lessening how much you smoke over a period of time. Research shows that quitting "cold turkey" is more successful than gradually quitting.  Attending in-person counseling to help you build problem-solving skills. You are more likely to have success in quitting if you attend several counseling sessions. Even short sessions of 10 minutes can be effective.  Finding resources and support systems that can help you to quit smoking and remain smoke-free after you quit. These resources are most helpful when you use them often. They can include:  Online chats with a counselor.  Telephone quitlines.  Printed self-help materials.  Support groups or group counseling.  Text messaging programs.  Mobile phone applications.  Taking medicines to help you quit smoking. (If you are  pregnant or breastfeeding, talk with your health care provider first.) Some medicines contain nicotine and some do not. Both types of medicines help with cravings, but the medicines that include nicotine help to relieve withdrawal symptoms. Your health care provider may recommend:  Nicotine patches, gum, or lozenges.  Nicotine inhalers or sprays.  Non-nicotine medicine that is taken by mouth. Talk with your health care provider about combining strategies, such as taking medicines while you are also receiving in-person counseling. Using these two strategies together makes you more likely to succeed in quitting than if you used either strategy on its own. If you are pregnant or breastfeeding, talk with your health care provider about finding counseling or other support strategies to quit smoking. Do not take medicine to help you   quit smoking unless told to do so by your health care provider. WHAT THINGS CAN I DO TO MAKE IT EASIER TO QUIT? Quitting smoking might feel overwhelming at first, but there is a lot that you can do to make it easier. Take these important actions:  Reach out to your family and friends and ask that they support and encourage you during this time. Call telephone quitlines, reach out to support groups, or work with a counselor for support.  Ask people who smoke to avoid smoking around you.  Avoid places that trigger you to smoke, such as bars, parties, or smoke-break areas at work.  Spend time around people who do not smoke.  Lessen stress in your life, because stress can be a smoking trigger for some people. To lessen stress, try:  Exercising regularly.  Deep-breathing exercises.  Yoga.  Meditating.  Performing a body scan. This involves closing your eyes, scanning your body from head to toe, and noticing which parts of your body are particularly tense. Purposefully relax the muscles in those areas.  Download or purchase mobile phone or tablet apps (applications)  that can help you stick to your quit plan by providing reminders, tips, and encouragement. There are many free apps, such as QuitGuide from the CDC (Centers for Disease Control and Prevention). You can find other support for quitting smoking (smoking cessation) through smokefree.gov and other websites. HOW WILL I FEEL WHEN I QUIT SMOKING? Within the first 24 hours of quitting smoking, you may start to feel some withdrawal symptoms. These symptoms are usually most noticeable 2-3 days after quitting, but they usually do not last beyond 2-3 weeks. Changes or symptoms that you might experience include:  Mood swings.  Restlessness, anxiety, or irritation.  Difficulty concentrating.  Dizziness.  Strong cravings for sugary foods in addition to nicotine.  Mild weight gain.  Constipation.  Nausea.  Coughing or a sore throat.  Changes in how your medicines work in your body.  A depressed mood.  Difficulty sleeping (insomnia). After the first 2-3 weeks of quitting, you may start to notice more positive results, such as:  Improved sense of smell and taste.  Decreased coughing and sore throat.  Slower heart rate.  Lower blood pressure.  Clearer skin.  The ability to breathe more easily.  Fewer sick days. Quitting smoking is very challenging for most people. Do not get discouraged if you are not successful the first time. Some people need to make many attempts to quit before they achieve long-term success. Do your best to stick to your quit plan, and talk with your health care provider if you have any questions or concerns.   This information is not intended to replace advice given to you by your health care provider. Make sure you discuss any questions you have with your health care provider.   Document Released: 12/21/2000 Document Revised: 05/13/2014 Document Reviewed: 05/13/2014 Elsevier Interactive Patient Education 2016 Elsevier Inc.  

## 2015-08-23 ENCOUNTER — Emergency Department (HOSPITAL_COMMUNITY)
Admission: EM | Admit: 2015-08-23 | Discharge: 2015-08-23 | Disposition: A | Discharge status: Home / Self Care | Payer: Medicaid Other | Attending: Dermatology | Admitting: Dermatology

## 2015-08-23 ENCOUNTER — Encounter (HOSPITAL_COMMUNITY): Payer: Self-pay | Admitting: *Deleted

## 2015-08-23 DIAGNOSIS — Y999 Unspecified external cause status: Secondary | ICD-10-CM | POA: Insufficient documentation

## 2015-08-23 DIAGNOSIS — X509XXA Other and unspecified overexertion or strenuous movements or postures, initial encounter: Secondary | ICD-10-CM | POA: Insufficient documentation

## 2015-08-23 DIAGNOSIS — M545 Low back pain: Secondary | ICD-10-CM | POA: Diagnosis not present

## 2015-08-23 DIAGNOSIS — Y9301 Activity, walking, marching and hiking: Secondary | ICD-10-CM | POA: Insufficient documentation

## 2015-08-23 DIAGNOSIS — Y929 Unspecified place or not applicable: Secondary | ICD-10-CM | POA: Insufficient documentation

## 2015-08-23 DIAGNOSIS — F17210 Nicotine dependence, cigarettes, uncomplicated: Secondary | ICD-10-CM | POA: Insufficient documentation

## 2015-08-23 DIAGNOSIS — Z5321 Procedure and treatment not carried out due to patient leaving prior to being seen by health care provider: Secondary | ICD-10-CM | POA: Diagnosis not present

## 2015-08-23 NOTE — ED Triage Notes (Addendum)
Pt reports lower back pain for two days. Pt had back surgery a year ago. Pt denies any recent injury, fall, heavy lifting, long distance travel, MVC, trauma. Pt first noted the pain while walking. Pt has not taken any medications for the pain. Denies loss of bowel or bladder

## 2015-08-23 NOTE — ED Notes (Signed)
No answer in waiting room 

## 2015-09-17 ENCOUNTER — Other Ambulatory Visit: Payer: Self-pay | Admitting: Specialist

## 2015-09-17 DIAGNOSIS — M545 Low back pain: Secondary | ICD-10-CM

## 2015-09-27 ENCOUNTER — Ambulatory Visit
Admission: RE | Admit: 2015-09-27 | Discharge: 2015-09-27 | Disposition: A | Discharge status: Home / Self Care | Payer: Medicaid Other | Source: Ambulatory Visit | Attending: Specialist | Admitting: Specialist

## 2015-09-27 DIAGNOSIS — M545 Low back pain: Secondary | ICD-10-CM

## 2015-09-27 MED ORDER — GADOBENATE DIMEGLUMINE 529 MG/ML IV SOLN
14.0000 mL | Freq: Once | INTRAVENOUS | Status: AC | PRN
  Administered 2015-09-27: 14 mL via INTRAVENOUS

## 2015-11-03 ENCOUNTER — Encounter (HOSPITAL_COMMUNITY): Payer: Self-pay | Admitting: Emergency Medicine

## 2015-11-03 ENCOUNTER — Ambulatory Visit (HOSPITAL_COMMUNITY): Payer: Medicaid Other

## 2015-11-03 ENCOUNTER — Emergency Department (HOSPITAL_COMMUNITY)
Admission: EM | Admit: 2015-11-03 | Discharge: 2015-11-03 | Disposition: A | Discharge status: Home / Self Care | Payer: Medicaid Other | Attending: Emergency Medicine | Admitting: Emergency Medicine

## 2015-11-03 DIAGNOSIS — Z7982 Long term (current) use of aspirin: Secondary | ICD-10-CM | POA: Diagnosis not present

## 2015-11-03 DIAGNOSIS — M25551 Pain in right hip: Secondary | ICD-10-CM | POA: Diagnosis present

## 2015-11-03 DIAGNOSIS — M7989 Other specified soft tissue disorders: Secondary | ICD-10-CM | POA: Diagnosis not present

## 2015-11-03 DIAGNOSIS — M5441 Lumbago with sciatica, right side: Secondary | ICD-10-CM | POA: Diagnosis not present

## 2015-11-03 DIAGNOSIS — Z96641 Presence of right artificial hip joint: Secondary | ICD-10-CM | POA: Diagnosis not present

## 2015-11-03 DIAGNOSIS — M5431 Sciatica, right side: Secondary | ICD-10-CM

## 2015-11-03 DIAGNOSIS — F17210 Nicotine dependence, cigarettes, uncomplicated: Secondary | ICD-10-CM | POA: Diagnosis not present

## 2015-11-03 MED ORDER — OXYCODONE-ACETAMINOPHEN 5-325 MG PO TABS: 1.0000 | ORAL_TABLET | ORAL | 0 refills | Status: DC | PRN

## 2015-11-03 MED ORDER — RIVAROXABAN (XARELTO) VTE STARTER PACK (15 & 20 MG): ORAL_TABLET | ORAL | 0 refills | Status: DC

## 2015-11-03 MED ORDER — METHOCARBAMOL 500 MG PO TABS: 500.0000 mg | ORAL_TABLET | Freq: Four times a day (QID) | ORAL | 0 refills | Status: DC | PRN

## 2015-11-03 MED ORDER — MORPHINE SULFATE (PF) 4 MG/ML IV SOLN
4.0000 mg | Freq: Once | INTRAVENOUS | Status: AC
  Administered 2015-11-03: 4 mg via INTRAVENOUS
  Filled 2015-11-03: qty 1

## 2015-11-03 MED ORDER — OXYCODONE-ACETAMINOPHEN 5-325 MG PO TABS
1.0000 | ORAL_TABLET | ORAL | 0 refills | Status: DC | PRN
Start: 2015-11-03 — End: 2015-11-03

## 2015-11-03 MED ORDER — METHOCARBAMOL 1000 MG/10ML IJ SOLN
1000.0000 mg | Freq: Once | INTRAVENOUS | Status: AC
  Administered 2015-11-03: 1000 mg via INTRAVENOUS
  Filled 2015-11-03: qty 10

## 2015-11-03 NOTE — ED Triage Notes (Signed)
Pt. reports right hip joint pain onset this evening , denies injury /ambulatory .

## 2015-11-03 NOTE — ED Provider Notes (Signed)
[facility]-EMERGENCY DEPT Provider Note   CSN: [id number] Arrival date & time: 11/03/15  0247  By signing my name below, I, Rosario AdieWilliam Andrew Hiatt, attest that this documentation has been prepared under the direction and in the presence of Dione Boozeavid Avry Monteleone, MD. Electronically Signed: Rosario AdieWilliam Andrew Hiatt, ED Scribe. 11/03/15. 4:04 AM.  History   Chief Complaint Chief Complaint  Patient presents with  . Hip Pain   The history is provided by the patient. No language interpreter was used.   HPI Comments: James Stevens is a 55 y.o. male who presents to the Emergency Department complaining of gradual onset, acute on chronic right hip pain onset this evening PTA. He additionally is c/o lower back pain, however, notes that it is additionally a chronic problem but has not acutely changed from baseline recently. Pt has a h/o similar chronic right hip pain associated with his h/o avascular necrosis of the hip and lumbar spinal stenosis and states that his pain today is similar; however, worsened from baseline. He additionally has had prior surgical intervention for his back/hip pain including lumbar laminectomy performed seven months ago, and notes that since this procedure he has had worsening bilateral lower extremity weakness. Pt has been taking Tylenol since the onset of his pain with minimal relief. There are no exacerbating factors for his pain. He denies numbness, tinging, bowel/bladder incontinence, or any other associated symptom.    Past Medical History:  Diagnosis Date  . Arthritis   . Bipolar affective (HCC)   . Chronic back pain    and right hip  . Depression   . GERD (gastroesophageal reflux disease)    occ tums  . Headache(784.0)    migraines  . Hip pain   . 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 Schizophrenia (HCC)   . Shortness of breath dyspnea    "if I walk a long way"   Patient Active Problem List   Diagnosis  Date Noted  . 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 HNP (herniated nucleus pulposus), lumbar 05/05/2014    Class: Chronic  . PVD (peripheral vascular disease) (HCC) 10/30/2013  . Aortoiliac occlusive disease (HCC) 10/07/2013  . Atherosclerosis of native arteries of the extremities with intermittent claudication 09/05/2013  . Peripheral vascular disease, unspecified 08/29/2013  . Avascular necrosis of hip (HCC) 04/17/2012   Past Surgical History:  Procedure Laterality Date  . AORTA - BILATERAL FEMORAL ARTERY BYPASS GRAFT N/A 10/07/2013   Procedure: AORTA BIFEMORAL BYPASS GRAFT;  Surgeon: Sherren Kernsharles E Fields, MD;  [facility];  Service: Vascular;  Laterality: N/A;  . ESOPHAGEAL DILATION    . ESOPHAGOGASTRODUODENOSCOPY  09/06/2011   Procedure: ESOPHAGOGASTRODUODENOSCOPY (EGD);  Surgeon: Vertell NovakJames L Edwards Jr., MD;  [facility];  Service: Endoscopy;  Laterality: N/A;  . FRACTURE SURGERY     left fingers # 4,5  . JOINT REPLACEMENT Bilateral    rt hip replacement, and  LT hip  . LUMBAR LAMINECTOMY N/A 05/05/2014   Procedure: BILATERAL LUMBAR LAMINECTOMY L4-5 AND L5-S1;  Surgeon: Kerrin ChampagneJames E Nitka, MD;  Location: [facility] OR;  Service: Orthopedics;  Laterality: N/A;  . MANDIBLE FRACTURE SURGERY    . SAVORY DILATION  09/06/2011   Procedure: SAVORY DILATION;  Surgeon: Vertell NovakJames L Edwards Jr., MD;  [facility];  Service: Endoscopy;  Laterality: N/A;  . TOTAL HIP ARTHROPLASTY Left 04/17/2012   Procedure: LEFT TOTAL HIP ARTHROPLASTY ANTERIOR  APPROACH;  Surgeon: Kathryne Hitch, MD;  Location: [facility];  Service: Orthopedics;  Laterality: Left;    Home Medications    Prior to Admission medications   Medication Sig Start Date End Date Taking? Authorizing Provider  aspirin EC 325 MG tablet Take 1 tablet (325 mg total) by mouth 2 (two) times daily. Patient not taking: Reported on 08/06/2015 04/19/12   Kirtland Bouchard, PA-C  docusate sodium  (COLACE) 100 MG capsule Take 1 capsule (100 mg total) by mouth 2 (two) times daily as needed for mild constipation. Patient not taking: Reported on 08/06/2015 10/12/13   Ames Coupe Rhyne, PA-C  erythromycin ophthalmic ointment 1 cm ribbon to affected eyelid qid x 10 days 07/20/15   Domenick Gong, MD  ferrous sulfate 325 (65 FE) MG tablet Take 1 tablet (325 mg total) by mouth 3 (three) times daily after meals. Patient not taking: Reported on 08/06/2015 04/19/12   Kirtland Bouchard, PA-C  sulfamethoxazole-trimethoprim (BACTRIM DS,SEPTRA DS) 800-160 MG tablet Take 2 tablets by mouth 2 (two) times daily. 07/20/15   Domenick Gong, MD  tiZANidine (ZANAFLEX) 4 MG capsule Take 1 capsule (4 mg total) by mouth as needed for muscle spasms. Patient not taking: Reported on 08/06/2015 05/05/14   Kerrin Champagne, MD  traZODone (DESYREL) 100 MG tablet Take 100 mg by mouth at bedtime as needed for sleep.     Historical Provider, MD   Family History Family History  Problem Relation Age of Onset  . Diabetes Mother   . Diabetes Father   . Diabetes Other   . Diabetes Brother    Social History Social History  Substance Use Topics  . Smoking status: Current Every Day Smoker    Packs/day: 0.20    Years: 34.00    Types: Cigarettes  . Smokeless tobacco: Never Used  . Alcohol use Yes     Comment: once a week    Allergies   Ibuprofen  Review of Systems Review of Systems  Musculoskeletal: Positive for arthralgias (right hip) and back pain (lower).  Neurological: Positive for weakness (BLE). Negative for numbness.       Negative for tingling, bowel/bladder incontinence.   All other systems reviewed and are negative.  Physical Exam Updated Vital Signs BP 104/75 (BP Location: Left Arm)   Pulse 98   Temp 97.4 F (36.3 C) (Oral)   Resp 18   Ht 5\' 10"  (1.778 m)   Wt 140 lb (63.5 kg)   SpO2 100%   BMI 20.09 kg/m   Physical Exam  Constitutional: He is oriented to person, place, and time. He appears  well-developed and well-nourished.  Restless, unable to find a comfortable position.   HENT:  Head: Normocephalic and atraumatic.  Eyes: EOM are normal. Pupils are equal, round, and reactive to light.  Neck: Normal range of motion. Neck supple. No JVD present.  Cardiovascular: Normal rate, regular rhythm and normal heart sounds.   No murmur heard. Pulmonary/Chest: Effort normal and breath sounds normal. He has no wheezes. He has no rales. He exhibits no tenderness.  Abdominal: Soft. Bowel sounds are normal. He exhibits no distension and no mass. There is no tenderness.  Musculoskeletal: Normal range of motion. He exhibits tenderness. He exhibits no edema.  Mild tenderness over the lower lumbar and right lumbar area. Negative straight leg raise. Full passive ROM of the right hip w/o pain.   Lymphadenopathy:    He has no cervical adenopathy.  Neurological: He is alert and oriented to  person, place, and time. No cranial nerve deficit. He exhibits normal muscle tone. Coordination normal.  Skin: Skin is warm and dry. No rash noted.  Psychiatric: He has a normal mood and affect. His behavior is normal. Judgment and thought content normal.  Nursing note and vitals reviewed.  ED Treatments / Results  DIAGNOSTIC STUDIES: Oxygen Saturation is 100% on RA, normal by my interpretation.   COORDINATION OF CARE: 4:04 AM-Discussed next steps with pt. Pt verbalized understanding and is agreeable with the plan.   Procedures Procedures   Medications Ordered in ED Medications  morphine 4 MG/ML injection 4 mg (not administered)  methocarbamol (ROBAXIN) 1,000 mg in dextrose 5 % 50 mL IVPB (not administered)    Initial Impression / Assessment and Plan / ED Course  I have reviewed the triage vital signs and the nursing notes.  Clinical Course   Right hip pain which seems most consistent with a radicular pain. No evidence of any actual joint pathology. Old records reviewed, and he has been seen in  the past for radicular pain as well as problems related to aorto-bifemoral bypass. CT angiogram in 2015 showed no evidence of abdominal aortic aneurysm.Marland Kitchen He will be given a dose of morphine and methocarbamol.  He had excellent relief of symptoms with above noted treatment. He is discharged with prescription for oxycodone have acetaminophen and methocarbamol. Follow-up with PCP.  Final Clinical Impressions(s) / ED Diagnoses   Final diagnoses:  Pain and swelling of left lower leg   New Prescriptions New Prescriptions   METHOCARBAMOL (ROBAXIN) 500 MG TABLET    Take 1 tablet (500 mg total) by mouth every 6 (six) hours as needed for muscle spasms.   OXYCODONE-ACETAMINOPHEN (PERCOCET) 5-325 MG TABLET    Take 1 tablet by mouth every 4 (four) hours as needed for moderate pain.   RIVAROXABAN 15 & 20 MG TBPK    Take as directed on package: Start with one 15mg  tablet by mouth twice a day with food. On Day 22, switch to one 20mg  tablet once a day with food.   I personally performed the services described in this documentation, which was scribed in my presence. The recorded information has been reviewed and is accurate.      Dione Booze, MD 11/03/15 [phone]

## 2015-11-04 ENCOUNTER — Encounter (HOSPITAL_COMMUNITY): Payer: Self-pay

## 2015-11-04 ENCOUNTER — Ambulatory Visit (HOSPITAL_COMMUNITY): Admission: RE | Admit: 2015-11-04 | Payer: Medicaid Other | Source: Ambulatory Visit

## 2015-11-04 ENCOUNTER — Emergency Department (HOSPITAL_COMMUNITY)
Admission: EM | Admit: 2015-11-04 | Discharge: 2015-11-05 | Disposition: A | Discharge status: Home / Self Care | Payer: Medicaid Other | Attending: Emergency Medicine | Admitting: Emergency Medicine

## 2015-11-04 ENCOUNTER — Emergency Department (HOSPITAL_COMMUNITY): Payer: Medicaid Other

## 2015-11-04 DIAGNOSIS — M5116 Intervertebral disc disorders with radiculopathy, lumbar region: Secondary | ICD-10-CM | POA: Diagnosis not present

## 2015-11-04 DIAGNOSIS — Z96643 Presence of artificial hip joint, bilateral: Secondary | ICD-10-CM | POA: Insufficient documentation

## 2015-11-04 DIAGNOSIS — Z7901 Long term (current) use of anticoagulants: Secondary | ICD-10-CM | POA: Insufficient documentation

## 2015-11-04 DIAGNOSIS — R1013 Epigastric pain: Secondary | ICD-10-CM | POA: Insufficient documentation

## 2015-11-04 DIAGNOSIS — M549 Dorsalgia, unspecified: Secondary | ICD-10-CM

## 2015-11-04 DIAGNOSIS — M545 Low back pain: Secondary | ICD-10-CM | POA: Diagnosis present

## 2015-11-04 DIAGNOSIS — F17210 Nicotine dependence, cigarettes, uncomplicated: Secondary | ICD-10-CM | POA: Insufficient documentation

## 2015-11-04 LAB — COMPREHENSIVE METABOLIC PANEL
ALBUMIN: 4.7 g/dL (ref 3.5–5.0)
ALT: 18 U/L (ref 17–63)
AST: 19 U/L (ref 15–41)
Alkaline Phosphatase: 61 U/L (ref 38–126)
Anion gap: 11 (ref 5–15)
BILIRUBIN TOTAL: 0.6 mg/dL (ref 0.3–1.2)
BUN: 22 mg/dL — AB (ref 6–20)
CHLORIDE: 97 mmol/L — AB (ref 101–111)
CO2: 26 mmol/L (ref 22–32)
Calcium: 10.2 mg/dL (ref 8.9–10.3)
Creatinine, Ser: 1.52 mg/dL — ABNORMAL HIGH (ref 0.61–1.24)
GFR calc Af Amer: 58 mL/min — ABNORMAL LOW (ref >60)
GFR calc non Af Amer: 50 mL/min — ABNORMAL LOW (ref >60)
GLUCOSE: 113 mg/dL — AB (ref 65–99)
Potassium: 4.8 mmol/L (ref 3.5–5.1)
Sodium: 134 mmol/L — ABNORMAL LOW (ref 135–145)
Total Protein: 8.9 g/dL — ABNORMAL HIGH (ref 6.5–8.1)

## 2015-11-04 LAB — URINE MICROSCOPIC-ADD ON

## 2015-11-04 LAB — CBC
HCT: 52.8 % — ABNORMAL HIGH (ref 39.0–52.0)
Hemoglobin: 18.3 g/dL — ABNORMAL HIGH (ref 13.0–17.0)
MCH: 34 pg (ref 26.0–34.0)
MCHC: 34.7 g/dL (ref 30.0–36.0)
MCV: 98.1 fL (ref 78.0–100.0)
Platelets: 245 10*3/uL (ref 150–400)
RBC: 5.38 MIL/uL (ref 4.22–5.81)
RDW: 13.4 % (ref 11.5–15.5)
WBC: 13.7 10*3/uL — ABNORMAL HIGH (ref 4.0–10.5)

## 2015-11-04 LAB — URINALYSIS, ROUTINE W REFLEX MICROSCOPIC
GLUCOSE, UA: NEGATIVE mg/dL
KETONES UR: NEGATIVE mg/dL
Nitrite: NEGATIVE
PH: 6 (ref 5.0–8.0)
Protein, ur: 100 mg/dL — AB
Specific Gravity, Urine: 1.028 (ref 1.005–1.030)

## 2015-11-04 LAB — LIPASE, BLOOD: LIPASE: 16 U/L (ref 11–51)

## 2015-11-04 MED ORDER — HYDROMORPHONE HCL 1 MG/ML IJ SOLN
1.0000 mg | Freq: Once | INTRAMUSCULAR | Status: AC
  Administered 2015-11-04: 1 mg via INTRAVENOUS
  Filled 2015-11-04: qty 1

## 2015-11-04 MED ORDER — SODIUM CHLORIDE 0.9 % IV BOLUS (SEPSIS)
1000.0000 mL | Freq: Once | INTRAVENOUS | Status: AC
  Administered 2015-11-04: 1000 mL via INTRAVENOUS

## 2015-11-04 MED ORDER — GADOBENATE DIMEGLUMINE 529 MG/ML IV SOLN
15.0000 mL | Freq: Once | INTRAVENOUS | Status: AC | PRN
Start: 2015-11-04 — End: 2015-11-04
  Administered 2015-11-04: 13 mL via INTRAVENOUS

## 2015-11-04 MED ORDER — LORAZEPAM 2 MG/ML IJ SOLN
1.0000 mg | Freq: Once | INTRAMUSCULAR | Status: AC
Start: 2015-11-04 — End: 2015-11-04
  Administered 2015-11-04: 1 mg via INTRAVENOUS
  Filled 2015-11-04: qty 1

## 2015-11-04 MED ORDER — SODIUM CHLORIDE 0.9 % IV SOLN
INTRAVENOUS | Status: DC
  Administered 2015-11-04: 15:00:00 via INTRAVENOUS

## 2015-11-04 NOTE — ED Notes (Signed)
Patient is resting comfortably. 

## 2015-11-04 NOTE — ED Notes (Signed)
Patient transported to MRI 

## 2015-11-04 NOTE — ED Provider Notes (Signed)
[facility]-EMERGENCY DEPT Provider Note   CSN: [id number] Arrival date & time: 11/04/15  1020     History   Chief Complaint Chief Complaint  Patient presents with  . Back Pain  . Emesis    HPI James Stevens is a 55 y.o. male.  55 year old male presents with chronic lower back pain that became worse several days ago. Was seen here yesterday for similar symptoms diagnosed with sciatica and was treated with Robaxin as well as morphine and felt better. Now complains of increasing lower lumbar sacral pain which radiates down to his legs and worse with movement better with rest. Denies any hematuria or dysuria. No fever or chills. Denies any new injury. Has had a history of vascular surgery in the past. Scheduled to see them today. Has had epigastric abdominal cramping which she says started after he began vomiting. Was prescribed Percocet but he has not been able to keep that down.      Past Medical History:  Diagnosis Date  . Arthritis   . Bipolar affective (HCC)   . Chronic back pain    and right hip  . Depression   . GERD (gastroesophageal reflux disease)    occ tums  . Headache(784.0)    migraines  . Hip pain   . 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 Schizophrenia (HCC)   . Shortness of breath dyspnea    "if I walk a long way"    Patient Active Problem List   Diagnosis Date Noted  . 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 HNP (herniated nucleus pulposus), lumbar 05/05/2014    Class: Chronic  . PVD (peripheral vascular disease) (HCC) 10/30/2013  . Aortoiliac occlusive disease (HCC) 10/07/2013  . Atherosclerosis of native arteries of the extremities with intermittent claudication 09/05/2013  . Peripheral vascular disease, unspecified 08/29/2013  . Avascular necrosis of hip (HCC) 04/17/2012    Past Surgical History:  Procedure Laterality Date  .  AORTA - BILATERAL FEMORAL ARTERY BYPASS GRAFT N/A 10/07/2013   Procedure: AORTA BIFEMORAL BYPASS GRAFT;  Surgeon: Sherren Kerns, MD;  Location: [facility] OR;  Service: Vascular;  Laterality: N/A;  . ESOPHAGEAL DILATION    . ESOPHAGOGASTRODUODENOSCOPY  09/06/2011   Procedure: ESOPHAGOGASTRODUODENOSCOPY (EGD);  Surgeon: Vertell Novak., MD;  [facility];  Service: Endoscopy;  Laterality: N/A;  . FRACTURE SURGERY     left fingers # 4,5  . JOINT REPLACEMENT Bilateral    rt hip replacement, and  LT hip  . LUMBAR LAMINECTOMY N/A 05/05/2014   Procedure: BILATERAL LUMBAR LAMINECTOMY L4-5 AND L5-S1;  Surgeon: Kerrin Champagne, MD;  Location: [facility] OR;  Service: Orthopedics;  Laterality: N/A;  . MANDIBLE FRACTURE SURGERY    . SAVORY DILATION  09/06/2011   Procedure: SAVORY DILATION;  Surgeon: Vertell Novak., MD;  [facility];  Service: Endoscopy;  Laterality: N/A;  . TOTAL HIP ARTHROPLASTY Left 04/17/2012   Procedure: LEFT TOTAL HIP ARTHROPLASTY ANTERIOR APPROACH;  Surgeon: Kathryne Hitch, MD;  Location: [facility] OR;  Service: Orthopedics;  Laterality: Left;       Home Medications    Prior to Admission medications   Medication Sig Start Date End Date Taking? Authorizing Provider  methocarbamol (ROBAXIN) 500 MG tablet Take 1 tablet (500 mg total) by mouth every 6 (six) hours as needed for muscle spasms. 11/03/15  Yes Dione Booze, MD  Rivaroxaban 15 & 20 MG TBPK Take as directed on package: Start with one 15mg  tablet by mouth twice a day with food. On Day 22, switch to one 20mg  tablet once a day with food. 11/03/15  Yes Dione Boozeavid Glick, MD  oxyCODONE-acetaminophen (PERCOCET) 5-325 MG tablet Take 1 tablet by mouth every 4 (four) hours as needed for moderate pain. Patient not taking: Reported on 11/04/2015 11/03/15   Dione Boozeavid Glick, MD    Family History Family History  Problem Relation Age of Onset  . Diabetes Mother   . Diabetes Father   . Diabetes Other   . Diabetes Brother      Social History Social History  Substance Use Topics  . Smoking status: Current Every Day Smoker    Packs/day: 0.20    Years: 34.00    Types: Cigarettes  . Smokeless tobacco: Never Used  . Alcohol use Yes     Comment: once a week      Allergies   Ibuprofen and Tramadol   Review of Systems Review of Systems  All other systems reviewed and are negative.    Physical Exam Updated Vital Signs BP 117/93 (BP Location: Left Arm)   Pulse 109   Temp 97.7 F (36.5 C) (Oral)   Resp 20   SpO2 100%   Physical Exam  Constitutional: He is oriented to person, place, and time. He appears well-developed and well-nourished.  Non-toxic appearance. No distress.  HENT:  Head: Normocephalic and atraumatic.  Eyes: Conjunctivae, EOM and lids are normal. Pupils are equal, round, and reactive to light.  Neck: Normal range of motion. Neck supple. No tracheal deviation present. No thyroid mass present.  Cardiovascular: Regular rhythm and normal heart sounds.  Tachycardia present.  Exam reveals no gallop.   No murmur heard. Pulmonary/Chest: Effort normal and breath sounds normal. No stridor. No respiratory distress. He has no decreased breath sounds. He has no wheezes. He has no rhonchi. He has no rales.  Abdominal: Soft. Normal appearance and bowel sounds are normal. He exhibits no distension. There is no tenderness. There is no rebound and no CVA tenderness.  Musculoskeletal: Normal range of motion. He exhibits no edema or tenderness.       Back:  Lateral lower extremity is warm to touch. Dorsalis pedis pulses palpable bilaterally.  Neurological: He is alert and oriented to person, place, and time. He has normal strength. No cranial nerve deficit or sensory deficit. GCS eye subscore is 4. GCS verbal subscore is 5. GCS motor subscore is 6.  Skin: Skin is warm and dry. No abrasion and no rash noted.  Psychiatric: He has a normal mood and affect. His speech is normal and behavior is normal.   Nursing note and vitals reviewed.    ED Treatments / Results  Labs (all labs ordered are listed, but only abnormal results are displayed) Labs Reviewed  COMPREHENSIVE METABOLIC PANEL - Abnormal; Notable for the following:       Result Value   Sodium 134 (*)    Chloride 97 (*)    Glucose, Bld 113 (*)    BUN 22 (*)    Creatinine, Ser 1.52 (*)    Total Protein 8.9 (*)    GFR calc non Af Amer 50 (*)    GFR calc Af Amer 58 (*)    All other components within normal limits  CBC - Abnormal; Notable for the following:    WBC 13.7 (*)    Hemoglobin 18.3 (*)    HCT 52.8 (*)  All other components within normal limits  LIPASE, BLOOD  URINALYSIS, ROUTINE W REFLEX MICROSCOPIC (NOT AT [facility])    EKG  EKG Interpretation None       Radiology No results found.  Procedures Procedures (including critical care time)  Medications Ordered in ED Medications  sodium chloride 0.9 % bolus 1,000 mL (not administered)  0.9 %  sodium chloride infusion (not administered)  HYDROmorphone (DILAUDID) injection 1 mg (not administered)  LORazepam (ATIVAN) injection 1 mg (not administered)     Initial Impression / Assessment and Plan / ED Course  I have reviewed the triage vital signs and the nursing notes.  Pertinent labs & imaging results that were available during my care of the patient were reviewed by me and considered in my medical decision making (see chart for details).  Clinical Course    Patient medicated for pain here. It better. He had a renal scan which is negative for kidney stones which was performed because he was having colicky pain. A reassessment patient has severe mid lower back pain. We'll MRI of back at this time. Neurological exam is stable. Signed out to oncoming provider  Final Clinical Impressions(s) / ED Diagnoses   Final diagnoses:  None    New Prescriptions New Prescriptions   No medications on file     Lorre Nick, MD 11/04/15 1739

## 2015-11-04 NOTE — ED Triage Notes (Signed)
Per EMS, pt from home.  Pt c/o back pain with n/v.  Vitals:  118/78, hr 84, resp 18, cbg 112

## 2015-11-04 NOTE — ED Triage Notes (Signed)
Pt here yesterday for same.  States back pain x 1 year.  Emesis x 2 days.  Dx sciatica yesterday.  Some abdominal pain.

## 2015-11-04 NOTE — ED Notes (Signed)
MD at bedside. 

## 2015-11-04 NOTE — ED Notes (Signed)
Patient transported to CT 

## 2015-11-05 ENCOUNTER — Telehealth (INDEPENDENT_AMBULATORY_CARE_PROVIDER_SITE_OTHER): Payer: Self-pay

## 2015-11-05 MED ORDER — PREDNISONE 20 MG PO TABS: 40.0000 mg | ORAL_TABLET | Freq: Every day | ORAL | 0 refills | Status: DC

## 2015-11-05 MED ORDER — OXYCODONE-ACETAMINOPHEN 5-325 MG PO TABS
1.0000 | ORAL_TABLET | Freq: Once | ORAL | Status: AC
  Administered 2015-11-05: 1 via ORAL
  Filled 2015-11-05: qty 1

## 2015-11-05 NOTE — Telephone Encounter (Signed)
See msg below, patient calling for rx.

## 2015-11-05 NOTE — Telephone Encounter (Signed)
We have evaluated his current lumbar status with a recent MRI  9/18. The findings do not indicate that he needs narcotic medication. 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

## 2015-11-05 NOTE — ED Provider Notes (Signed)
I received this pt in signout from Dr. Freida BusmanAllen. We were awaiting MRI results. MRI shows disc protrusion compressing L5 nerve, no spinal cord impingement and no acute findings. Patient asleep on reexamination. I discussed MRI findings and provided patient with neurosurgery spine follow-up information to discuss further management options. Patient denies a history of diabetes and no recent steroids, provided with a short steroid course. Patient discharged in satisfactory condition.   Laurence Spatesachel Morgan Louisa Favaro, MD 11/05/15 [phone]

## 2015-11-05 NOTE — Discharge Instructions (Signed)
Return immediately if you have loss of bowel or bladder function, numbness in your groin or legs, or weakness of your legs; or if you develop a fever.

## 2015-11-05 NOTE — ED Notes (Signed)
Pt ambulatory and independent at discharge.  Wheeled to significant other's car.  Verbalized understanding of discharge and follow up instructions.

## 2015-11-06 NOTE — Telephone Encounter (Signed)
Called patient and left voicemail advising per Dr. Otelia SergeantNitka no pain medication at this time. This can be discussed more at his office visit on 11/12/15 Thanks. Herbert SetaHeather

## 2015-11-16 ENCOUNTER — Encounter (INDEPENDENT_AMBULATORY_CARE_PROVIDER_SITE_OTHER): Payer: Self-pay | Admitting: Specialist

## 2015-11-16 ENCOUNTER — Ambulatory Visit (INDEPENDENT_AMBULATORY_CARE_PROVIDER_SITE_OTHER): Payer: Medicaid Other | Admitting: Specialist

## 2015-11-16 VITALS — BP 120/83 | HR 107 | Ht 70.0 in | Wt 140.0 lb

## 2015-11-16 DIAGNOSIS — M5442 Lumbago with sciatica, left side: Secondary | ICD-10-CM | POA: Diagnosis not present

## 2015-11-16 DIAGNOSIS — G8929 Other chronic pain: Secondary | ICD-10-CM

## 2015-11-16 DIAGNOSIS — M5416 Radiculopathy, lumbar region: Secondary | ICD-10-CM

## 2015-11-16 DIAGNOSIS — M5441 Lumbago with sciatica, right side: Secondary | ICD-10-CM | POA: Diagnosis not present

## 2015-11-16 MED ORDER — ACETAMINOPHEN-CODEINE #4 300-60 MG PO TABS: 1.0000 | ORAL_TABLET | ORAL | 0 refills | Status: DC | PRN

## 2015-11-16 NOTE — Patient Instructions (Signed)
Please pay close attention to how you feel immediately after you have had your injection with Dr Alvester MorinNewton. We will be asking if you've had any improvement when you return for follow-up visit with James Stevens.

## 2015-11-16 NOTE — Addendum Note (Signed)
Addended by: Vira BrownsNITKA, Cameo Schmiesing on: 11/16/2015 04:01 PM   Modules accepted: Orders

## 2015-11-16 NOTE — Addendum Note (Signed)
Addended by: Suanne MarkerWOOD, Maeby Vankleeck N on: 11/16/2015 04:03 PM   Modules accepted: Orders

## 2015-11-16 NOTE — Progress Notes (Addendum)
Office Visit Note   Patient: James AnchorsDaniel J Burkitt           Date of Birth: 04/07/1960           MRN: [id number] Visit Date: 11/16/2015              Requested by: Fleet ContrasEdwin Avbuere, MD [address] [place], [place] [id number] PCP: Dorrene GermanEdwin A Avbuere, MD   Assessment & Plan: Visit Diagnoses:  1. Radiculopathy, lumbar region     Plan: We'll schedule patient for left L4-5 transforaminal ESI and right L5-S1 transforaminal ESI with Dr. Alvester MorinNewton. Patient follow up in the office in 4 weeks for recheck. Advised him to pay close attention to how he feels it medially after the injection. Advised patient that if we were to entertain the idea of surgery that it would ultimately come down him needing 2 level fusion. We will like to exhaust all conservative measures before offering him that. Patient agrees.  Addendum:Patient expressed concerns because of pain in order for tramadol was not given to him but discussed with him. He wants to know why he would've been given tramadol which he is allergic to. He also is allergic to nonsteroidal anti-inflammatory agents. Wants to know why he cannot have hydrocodone or something stronger. I discussed with Mr. Deloria LairHamilton the fact that he has disc that are worn his back degenerated and injection treatment may be of some benefit. He rejects the idea of further injections as he has had 2 in the past that didn't help looking through his notes his last injection was in January 2015 and this was prior to his surgery of April 2016. He has had no injections since that time but presented numerous occasions to the emergency room complaining of pain has called our facility requesting further narcotic medicines and I suspect the next 20 minutes after he had been seen by Andee LinemanJames Owens,PA-C discussing why we should not prescribe stronger narcotic medicines for this condition which is chronic. This narcotic medicines really don't change his back condition 1 bit. They do however place him at risk of  developing opiate dependence. I would prescribe Tylenol No. 4 one every 6 hours for discomfort if he desires some medication nothing stronger. He relates that he is unable to take Motrin or Aleve and is allergic to tramadol causes him to itch. Discussed consideration of ESI left side at the L4-5 level right side at L5-S1 he was upset that he was not going to position prescribe a stronger medicines that he stood up in the walked out the office showing no limp or list or pelvic obliquity.  Follow-Up Instructions: Return in about 6 weeks (around 12/28/2015).   Orders:  No orders of the defined types were placed in this encounter.  No orders of the defined types were placed in this encounter.     Procedures: No procedures performed   Clinical Data: No additional findings.   Subjective: Chief Complaint  Patient presents with  . Lower Back - Pain, Follow-up    Patient is returning today to review MRI Lspine. Patient complains with low back pain. Pain radiates into bilateral lower extremities with numbness. States he is having some swelling in his right leg.   Patient states that he continues to have ongoing pain down both legs. Right leg pain is worse down to his right foot. Most of the pain in the left side extends down towards his knee. Numbness and tingling in both legs.  He had a lumbar MRI scan  done while at the ER 11/04/2015. Report Read:  T12-L1 thru L3-4: No disc bulge, canal stenosis nor neural foraminal narrowing.  L4-5: Status post RIGHT hemilaminectomy. 7 mm LEFT central to subarticular disc protrusion effaces the LEFT lateral recess, posteriorly displacing the traversing LEFT L5 nerve. Mild canal stenosis. Mild RIGHT, mild to moderate LEFT neural foraminal narrowing.  L5-S1: Bilateral laminectomies. 6 mm broad-based disc bulge with enhancing annular fissure. No canal stenosis. Moderate RIGHT, mild to moderate LEFT neural foraminal  narrowing.  IMPRESSION: Degenerative change of lower lumbar spine including large LEFT L4-5 disc protrusion which displaces the traversing LEFT L5 nerve. Mild canal stenosis L4-5.  Status post RIGHT L4-5 and bilateral L5-S1 laminectomies.  Neural foraminal narrowing L4-5 and L5-S1, moderate on the RIGHT at L5-S1.  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   Review of Systems  HENT: Negative.   Respiratory: Negative.   Cardiovascular: Negative.   Gastrointestinal: Negative.   Musculoskeletal: Positive for back pain.  Neurological: Positive for weakness and numbness.  Psychiatric/Behavioral: Negative.      Objective: Vital Signs: BP 120/83   Pulse (!) 107   Ht 5\' 10"  (1.778 m)   Wt 140 lb (63.5 kg)   BMI 20.09 kg/m   Physical Exam  Constitutional: He is oriented to person, place, and time. No distress.  HENT:  Head: Normocephalic and atraumatic.  Eyes: Pupils are equal, round, and reactive to light.  Neck: Normal range of motion.  Pulmonary/Chest: No respiratory distress.  Abdominal: He exhibits no distension.  Neurological: He is alert and oriented to person, place, and time.  Skin: Skin is warm.    Ortho Exam Gait is somewhat antalgic. He has lumbar paraspinal tenderness. Negative logroll bilateral hips. He has tight bilateral hamstrings. Complains of pain in the left low back and left buttock and hip area with left straight leg raise. Bilateral calves nontender. He has moderate to markedly tender over the bilateral hip greater trochanter versus and also tender down the IT bands. No focal motor deficits. Specialty Comments:  No specialty comments available.  Imaging: No results found.   PMFS History: Patient Active Problem List   Diagnosis Date Noted  . 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 HNP (herniated nucleus pulposus), lumbar 05/05/2014    Class: Chronic  . PVD (peripheral vascular  disease) (HCC) 10/30/2013  . Aortoiliac occlusive disease (HCC) 10/07/2013  . Atherosclerosis of native arteries of the extremities with intermittent claudication 09/05/2013  . Peripheral vascular disease, unspecified 08/29/2013  . Avascular necrosis of hip (HCC) 04/17/2012   Past Medical History:  Diagnosis Date  . Arthritis   . Bipolar affective (HCC)   . Chronic back pain    and right hip  . Depression   . GERD (gastroesophageal reflux disease)    occ tums  . Headache(784.0)    migraines  . Hip pain   . 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 Schizophrenia (HCC)   . Shortness of breath dyspnea    "if I walk a long way"    Family History  Problem Relation Age of Onset  . Diabetes Mother   . Diabetes Father   . Diabetes Other   . Diabetes Brother     Past Surgical History:  Procedure Laterality Date  . AORTA - BILATERAL FEMORAL ARTERY BYPASS GRAFT N/A 10/07/2013   Procedure: AORTA BIFEMORAL BYPASS GRAFT;  Surgeon: Janetta Hora  Fields, MD;  Location: [facility] OR;  Service: Vascular;  Laterality: N/A;  . ESOPHAGEAL DILATION    . ESOPHAGOGASTRODUODENOSCOPY  09/06/2011   Procedure: ESOPHAGOGASTRODUODENOSCOPY (EGD);  Surgeon: Vertell NovakJames L Edwards Jr., MD;  [facility];  Service: Endoscopy;  Laterality: N/A;  . FRACTURE SURGERY     left fingers # 4,5  . JOINT REPLACEMENT Bilateral    rt hip replacement, and  LT hip  . LUMBAR LAMINECTOMY N/A 05/05/2014   Procedure: BILATERAL LUMBAR LAMINECTOMY L4-5 AND L5-S1;  Surgeon: Kerrin ChampagneJames E Nitka, MD;  Location: [facility] OR;  Service: Orthopedics;  Laterality: N/A;  . MANDIBLE FRACTURE SURGERY    . SAVORY DILATION  09/06/2011   Procedure: SAVORY DILATION;  Surgeon: Vertell NovakJames L Edwards Jr., MD;  [facility];  Service: Endoscopy;  Laterality: N/A;  . TOTAL HIP ARTHROPLASTY Left 04/17/2012   Procedure: LEFT TOTAL HIP ARTHROPLASTY ANTERIOR APPROACH;  Surgeon: Kathryne Hitchhristopher Y Blackman, MD;   Location: [facility] OR;  Service: Orthopedics;  Laterality: Left;   Social History   Occupational History  . Not on file.   Social History Main Topics  . Smoking status: Current Every Day Smoker    Packs/day: 0.20    Years: 34.00    Types: Cigarettes  . Smokeless tobacco: Never Used  . Alcohol use Yes     Comment: once a week   . Drug use:     Frequency: 2.0 times per week    Types: Cocaine     Comment: Last time was 04/30/14 - "Stopped using for surgery."  . Sexual activity: Not on file

## 2015-11-27 NOTE — Addendum Note (Signed)
Addended by: Burton ApleyPETTY, Giulliana Mcroberts A on: 11/27/2015 02:32 PM   Modules accepted: Orders

## 2015-12-23 ENCOUNTER — Ambulatory Visit: Payer: Medicaid Other | Attending: Neurosurgery | Admitting: Physical Therapy

## 2016-03-02 ENCOUNTER — Other Ambulatory Visit: Payer: Self-pay | Admitting: Neurosurgery

## 2016-03-15 ENCOUNTER — Inpatient Hospital Stay (HOSPITAL_COMMUNITY)
Admission: RE | Admit: 2016-03-15 | Discharge: 2016-03-15 | Disposition: A | Discharge status: Home / Self Care | Payer: Medicaid Other | Source: Ambulatory Visit

## 2016-03-15 NOTE — Pre-Procedure Instructions (Signed)
    James RossettiDaniel J [facility]  03/15/2016      [facility] - [place], [place] - [address] [place] [id number] Phone: [phone] Fax: [phone]  [facility] - [place], [place] - [address] AT [facility] [address] [place] [address] Phone: [phone] Fax: [phone]    Your procedure is scheduled on 03/23/16.  [facility]  Call this number if you have problems the morning of surgery:  [phone]   Remember:  Do not eat food or drink liquids after midnight.  Take these medicines the morning of surgery with A SIP OF WATER --vit c   Do not wear jewelry, make-up or nail polish.  Do not wear lotions, powders, or perfumes, or deoderant.  Do not shave 48 hours prior to surgery.  Men may shave face and neck.  Do not bring valuables to the hospital.  [facility] is not responsible for any belongings or valuables.  Contacts, dentures or bridgework may not be worn into surgery.  Leave your suitcase in the car.  After surgery it may be brought to your room.  For patients admitted to the hospital, discharge time will be determined by your treatment team.  Patients discharged the day of surgery will not be allowed to drive home.   Name and phone number of your driver:   Special instructions: Do not take any aspirin,anti-inflammatories,vitamins,or herbal supplements 5-7 days prior to surgery.  Please read over the following fact sheets that you were given. MRSA Information

## 2016-03-17 ENCOUNTER — Encounter (HOSPITAL_COMMUNITY): Payer: Self-pay

## 2016-03-17 ENCOUNTER — Encounter (HOSPITAL_COMMUNITY)
Admission: RE | Admit: 2016-03-17 | Discharge: 2016-03-17 | Disposition: A | Discharge status: Home / Self Care | Payer: Medicaid Other | Source: Ambulatory Visit | Attending: Neurosurgery | Admitting: Neurosurgery

## 2016-03-17 DIAGNOSIS — M961 Postlaminectomy syndrome, not elsewhere classified: Secondary | ICD-10-CM | POA: Diagnosis not present

## 2016-03-17 DIAGNOSIS — Z01812 Encounter for preprocedural laboratory examination: Secondary | ICD-10-CM | POA: Insufficient documentation

## 2016-03-17 DIAGNOSIS — M5136 Other intervertebral disc degeneration, lumbar region: Secondary | ICD-10-CM | POA: Diagnosis not present

## 2016-03-17 HISTORY — DX: Peripheral vascular disease, unspecified: I73.9

## 2016-03-17 LAB — CBC
HCT: 46.8 % (ref 39.0–52.0)
Hemoglobin: 15.9 g/dL (ref 13.0–17.0)
MCH: 33.7 pg (ref 26.0–34.0)
MCHC: 34 g/dL (ref 30.0–36.0)
MCV: 99.2 fL (ref 78.0–100.0)
Platelets: 199 10*3/uL (ref 150–400)
RBC: 4.72 MIL/uL (ref 4.22–5.81)
RDW: 13.7 % (ref 11.5–15.5)
WBC: 8.4 10*3/uL (ref 4.0–10.5)

## 2016-03-17 LAB — COMPREHENSIVE METABOLIC PANEL
ALBUMIN: 3.7 g/dL (ref 3.5–5.0)
ALT: 33 U/L (ref 17–63)
AST: 32 U/L (ref 15–41)
Alkaline Phosphatase: 68 U/L (ref 38–126)
Anion gap: 6 (ref 5–15)
BUN: 15 mg/dL (ref 6–20)
CO2: 26 mmol/L (ref 22–32)
Calcium: 9.1 mg/dL (ref 8.9–10.3)
Chloride: 110 mmol/L (ref 101–111)
Creatinine, Ser: 1.31 mg/dL — ABNORMAL HIGH (ref 0.61–1.24)
GFR calc Af Amer: >60 mL/min (ref >60)
GFR calc non Af Amer: 60 mL/min — ABNORMAL LOW (ref >60)
GLUCOSE: 104 mg/dL — AB (ref 65–99)
POTASSIUM: 4.6 mmol/L (ref 3.5–5.1)
Sodium: 142 mmol/L (ref 135–145)
Total Bilirubin: 0.2 mg/dL — ABNORMAL LOW (ref 0.3–1.2)
Total Protein: 6.4 g/dL — ABNORMAL LOW (ref 6.5–8.1)

## 2016-03-17 LAB — TYPE AND SCREEN
ABO/RH(D): O POS
Antibody Screen: NEGATIVE

## 2016-03-17 LAB — SURGICAL PCR SCREEN
MRSA, PCR: NEGATIVE
Staphylococcus aureus: NEGATIVE

## 2016-03-17 NOTE — Progress Notes (Signed)
PCP is Dr Concepcion ElkAvbuere Denies ever seeing a cardiologist. Denies nay chest pain Denies ever having a stress test, card cath, or echo. States he last used cocaine yesterday 03-16-16. Instructed him not to use before surgery, state "I'm trying to stop, I want use it before surgery" And educated not to smoke the day of surgery, voices understanding.

## 2016-03-23 ENCOUNTER — Inpatient Hospital Stay (HOSPITAL_COMMUNITY): Payer: Medicaid Other

## 2016-03-23 ENCOUNTER — Encounter (HOSPITAL_COMMUNITY): Payer: Self-pay | Admitting: Anesthesiology

## 2016-03-23 ENCOUNTER — Inpatient Hospital Stay (HOSPITAL_COMMUNITY): Payer: Medicaid Other | Admitting: Anesthesiology

## 2016-03-23 ENCOUNTER — Inpatient Hospital Stay (HOSPITAL_COMMUNITY)
Admission: RE | Disposition: A | Discharge status: Home / Self Care | Payer: Self-pay | Source: Ambulatory Visit | Attending: Neurosurgery

## 2016-03-23 ENCOUNTER — Inpatient Hospital Stay (HOSPITAL_COMMUNITY)
Admission: RE | Admit: 2016-03-23 | Discharge: 2016-03-25 | DRG: 455 | Disposition: A | Discharge status: Home / Self Care | Payer: Medicaid Other | Source: Ambulatory Visit | Attending: Neurosurgery | Admitting: Neurosurgery

## 2016-03-23 DIAGNOSIS — Z885 Allergy status to narcotic agent status: Secondary | ICD-10-CM | POA: Diagnosis not present

## 2016-03-23 DIAGNOSIS — M5116 Intervertebral disc disorders with radiculopathy, lumbar region: Secondary | ICD-10-CM | POA: Diagnosis present

## 2016-03-23 DIAGNOSIS — Z419 Encounter for procedure for purposes other than remedying health state, unspecified: Secondary | ICD-10-CM

## 2016-03-23 DIAGNOSIS — Z888 Allergy status to other drugs, medicaments and biological substances status: Secondary | ICD-10-CM

## 2016-03-23 DIAGNOSIS — I739 Peripheral vascular disease, unspecified: Secondary | ICD-10-CM | POA: Diagnosis present

## 2016-03-23 DIAGNOSIS — F17210 Nicotine dependence, cigarettes, uncomplicated: Secondary | ICD-10-CM | POA: Diagnosis present

## 2016-03-23 DIAGNOSIS — Z96643 Presence of artificial hip joint, bilateral: Secondary | ICD-10-CM | POA: Diagnosis present

## 2016-03-23 DIAGNOSIS — K219 Gastro-esophageal reflux disease without esophagitis: Secondary | ICD-10-CM | POA: Diagnosis present

## 2016-03-23 DIAGNOSIS — M5126 Other intervertebral disc displacement, lumbar region: Secondary | ICD-10-CM | POA: Diagnosis present

## 2016-03-23 DIAGNOSIS — M5117 Intervertebral disc disorders with radiculopathy, lumbosacral region: Secondary | ICD-10-CM | POA: Diagnosis present

## 2016-03-23 DIAGNOSIS — Z95828 Presence of other vascular implants and grafts: Secondary | ICD-10-CM | POA: Diagnosis not present

## 2016-03-23 DIAGNOSIS — Z79899 Other long term (current) drug therapy: Secondary | ICD-10-CM

## 2016-03-23 SURGERY — POSTERIOR LUMBAR FUSION 2 LEVEL
Anesthesia: General | Site: Back

## 2016-03-23 MED ORDER — CEFAZOLIN SODIUM-DEXTROSE 2-4 GM/100ML-% IV SOLN
INTRAVENOUS | Status: AC
  Filled 2016-03-23: qty 100

## 2016-03-23 MED ORDER — MIDAZOLAM HCL 2 MG/2ML IJ SOLN
INTRAMUSCULAR | Status: DC | PRN
  Administered 2016-03-23: 2 mg via INTRAVENOUS

## 2016-03-23 MED ORDER — PROPOFOL 10 MG/ML IV BOLUS
INTRAVENOUS | Status: AC
Start: 2016-03-23 — End: 2016-03-23
  Filled 2016-03-23: qty 40

## 2016-03-23 MED ORDER — SODIUM CHLORIDE 0.9% FLUSH: 3.0000 mL | INTRAVENOUS | Status: DC | PRN

## 2016-03-23 MED ORDER — LACTATED RINGERS IV SOLN
INTRAVENOUS | Status: DC
  Administered 2016-03-23: 09:00:00 via INTRAVENOUS

## 2016-03-23 MED ORDER — ONDANSETRON HCL 4 MG/2ML IJ SOLN
INTRAMUSCULAR | Status: DC | PRN
  Administered 2016-03-23: 4 mg via INTRAVENOUS

## 2016-03-23 MED ORDER — ONDANSETRON HCL 4 MG PO TABS: 4.0000 mg | ORAL_TABLET | Freq: Four times a day (QID) | ORAL | Status: DC | PRN

## 2016-03-23 MED ORDER — FENTANYL CITRATE (PF) 100 MCG/2ML IJ SOLN
INTRAMUSCULAR | Status: AC
  Administered 2016-03-23: 50 ug via INTRAVENOUS
  Filled 2016-03-23: qty 2

## 2016-03-23 MED ORDER — PROPOFOL 10 MG/ML IV BOLUS
INTRAVENOUS | Status: DC | PRN
  Administered 2016-03-23: 200 mg via INTRAVENOUS

## 2016-03-23 MED ORDER — THROMBIN 20000 UNITS EX SOLR
CUTANEOUS | Status: AC
  Filled 2016-03-23: qty 20000

## 2016-03-23 MED ORDER — MENTHOL 3 MG MT LOZG: 1.0000 | LOZENGE | OROMUCOSAL | Status: DC | PRN

## 2016-03-23 MED ORDER — CEFAZOLIN SODIUM-DEXTROSE 2-4 GM/100ML-% IV SOLN
2.0000 g | Freq: Three times a day (TID) | INTRAVENOUS | Status: AC
  Administered 2016-03-23 – 2016-03-24 (×2): 2 g via INTRAVENOUS
  Filled 2016-03-23 (×2): qty 100

## 2016-03-23 MED ORDER — VANCOMYCIN HCL 1000 MG IV SOLR
INTRAVENOUS | Status: AC
  Filled 2016-03-23: qty 1000

## 2016-03-23 MED ORDER — CHLORHEXIDINE GLUCONATE CLOTH 2 % EX PADS: 6.0000 | MEDICATED_PAD | Freq: Once | CUTANEOUS | Status: DC

## 2016-03-23 MED ORDER — MORPHINE SULFATE (PF) 4 MG/ML IV SOLN
4.0000 mg | INTRAVENOUS | Status: DC | PRN
  Administered 2016-03-23 – 2016-03-24 (×2): 4 mg via INTRAVENOUS
  Filled 2016-03-23 (×2): qty 1

## 2016-03-23 MED ORDER — CYCLOBENZAPRINE HCL 10 MG PO TABS
10.0000 mg | ORAL_TABLET | Freq: Three times a day (TID) | ORAL | Status: DC | PRN
  Administered 2016-03-23 – 2016-03-25 (×5): 10 mg via ORAL
  Filled 2016-03-23 (×5): qty 1

## 2016-03-23 MED ORDER — PHENYLEPHRINE 40 MCG/ML (10ML) SYRINGE FOR IV PUSH (FOR BLOOD PRESSURE SUPPORT)
PREFILLED_SYRINGE | INTRAVENOUS | Status: AC
  Filled 2016-03-23: qty 10

## 2016-03-23 MED ORDER — FENTANYL CITRATE (PF) 100 MCG/2ML IJ SOLN
INTRAMUSCULAR | Status: DC | PRN
  Administered 2016-03-23 (×3): 100 ug via INTRAVENOUS

## 2016-03-23 MED ORDER — THROMBIN 5000 UNITS EX SOLR
CUTANEOUS | Status: AC
  Filled 2016-03-23: qty 5000

## 2016-03-23 MED ORDER — ONDANSETRON HCL 4 MG/2ML IJ SOLN: 4.0000 mg | Freq: Four times a day (QID) | INTRAMUSCULAR | Status: DC | PRN

## 2016-03-23 MED ORDER — OXYCODONE HCL 5 MG PO TABS
5.0000 mg | ORAL_TABLET | ORAL | Status: DC | PRN
  Administered 2016-03-23 – 2016-03-25 (×8): 10 mg via ORAL
  Filled 2016-03-23 (×8): qty 2

## 2016-03-23 MED ORDER — BUPIVACAINE LIPOSOME 1.3 % IJ SUSP
20.0000 mL | INTRAMUSCULAR | Status: AC
  Administered 2016-03-23: 20 mL
  Filled 2016-03-23: qty 20

## 2016-03-23 MED ORDER — THROMBIN 20000 UNITS EX SOLR
CUTANEOUS | Status: DC | PRN
  Administered 2016-03-23: 13:00:00 via TOPICAL

## 2016-03-23 MED ORDER — PHENYLEPHRINE HCL 10 MG/ML IJ SOLN
INTRAVENOUS | Status: DC | PRN
  Administered 2016-03-23: 25 ug/min via INTRAVENOUS

## 2016-03-23 MED ORDER — SODIUM CHLORIDE 0.9% FLUSH
3.0000 mL | Freq: Two times a day (BID) | INTRAVENOUS | Status: DC
  Administered 2016-03-24: 3 mL via INTRAVENOUS

## 2016-03-23 MED ORDER — BACITRACIN ZINC 500 UNIT/GM EX OINT
TOPICAL_OINTMENT | CUTANEOUS | Status: AC
  Filled 2016-03-23: qty 28.35

## 2016-03-23 MED ORDER — MIDAZOLAM HCL 2 MG/2ML IJ SOLN
INTRAMUSCULAR | Status: AC
  Filled 2016-03-23: qty 2

## 2016-03-23 MED ORDER — FENTANYL CITRATE (PF) 100 MCG/2ML IJ SOLN
INTRAMUSCULAR | Status: AC
  Filled 2016-03-23: qty 2

## 2016-03-23 MED ORDER — FENTANYL CITRATE (PF) 100 MCG/2ML IJ SOLN
25.0000 ug | INTRAMUSCULAR | Status: DC | PRN
  Administered 2016-03-23 (×2): 50 ug via INTRAVENOUS

## 2016-03-23 MED ORDER — LIDOCAINE 2% (20 MG/ML) 5 ML SYRINGE
INTRAMUSCULAR | Status: AC
  Filled 2016-03-23: qty 5

## 2016-03-23 MED ORDER — CEFAZOLIN SODIUM-DEXTROSE 2-4 GM/100ML-% IV SOLN
2.0000 g | INTRAVENOUS | Status: DC
  Filled 2016-03-23: qty 100

## 2016-03-23 MED ORDER — BISACODYL 10 MG RE SUPP: 10.0000 mg | Freq: Every day | RECTAL | Status: DC | PRN

## 2016-03-23 MED ORDER — BUPIVACAINE-EPINEPHRINE (PF) 0.5% -1:200000 IJ SOLN
INTRAMUSCULAR | Status: DC | PRN
  Administered 2016-03-23: 10 mL

## 2016-03-23 MED ORDER — HYDRALAZINE HCL 20 MG/ML IJ SOLN
5.0000 mg | INTRAMUSCULAR | Status: DC | PRN
  Administered 2016-03-23: 5 mg via INTRAVENOUS

## 2016-03-23 MED ORDER — ACETAMINOPHEN 325 MG PO TABS
650.0000 mg | ORAL_TABLET | ORAL | Status: DC | PRN
  Administered 2016-03-23: 650 mg via ORAL
  Filled 2016-03-23: qty 2

## 2016-03-23 MED ORDER — FENTANYL CITRATE (PF) 100 MCG/2ML IJ SOLN
INTRAMUSCULAR | Status: AC
  Filled 2016-03-23: qty 4

## 2016-03-23 MED ORDER — ACETAMINOPHEN 650 MG RE SUPP: 650.0000 mg | RECTAL | Status: DC | PRN

## 2016-03-23 MED ORDER — SODIUM CHLORIDE 0.9 % IV SOLN: 250.0000 mL | INTRAVENOUS | Status: DC

## 2016-03-23 MED ORDER — ROCURONIUM BROMIDE 50 MG/5ML IV SOSY
PREFILLED_SYRINGE | INTRAVENOUS | Status: AC
  Filled 2016-03-23: qty 20

## 2016-03-23 MED ORDER — HYDRALAZINE HCL 20 MG/ML IJ SOLN
INTRAMUSCULAR | Status: AC
  Administered 2016-03-23: 5 mg via INTRAVENOUS
  Filled 2016-03-23: qty 1

## 2016-03-23 MED ORDER — BUPIVACAINE-EPINEPHRINE (PF) 0.5% -1:200000 IJ SOLN
INTRAMUSCULAR | Status: AC
  Filled 2016-03-23: qty 30

## 2016-03-23 MED ORDER — BACITRACIN ZINC 500 UNIT/GM EX OINT
TOPICAL_OINTMENT | CUTANEOUS | Status: DC | PRN
  Administered 2016-03-23: 1 via TOPICAL

## 2016-03-23 MED ORDER — CEFAZOLIN SODIUM-DEXTROSE 2-4 GM/100ML-% IV SOLN
2.0000 g | INTRAVENOUS | Status: AC
  Administered 2016-03-23 (×2): 2 g via INTRAVENOUS

## 2016-03-23 MED ORDER — LIDOCAINE HCL (CARDIAC) 20 MG/ML IV SOLN
INTRAVENOUS | Status: DC | PRN
  Administered 2016-03-23: 100 mg via INTRAVENOUS

## 2016-03-23 MED ORDER — PROMETHAZINE HCL 25 MG/ML IJ SOLN: 6.2500 mg | INTRAMUSCULAR | Status: DC | PRN

## 2016-03-23 MED ORDER — SUGAMMADEX SODIUM 200 MG/2ML IV SOLN
INTRAVENOUS | Status: DC | PRN
  Administered 2016-03-23: 123.4 mg via INTRAVENOUS

## 2016-03-23 MED ORDER — SODIUM CHLORIDE 0.9 % IR SOLN
Status: DC | PRN
  Administered 2016-03-23: 13:00:00

## 2016-03-23 MED ORDER — SUGAMMADEX SODIUM 200 MG/2ML IV SOLN
INTRAVENOUS | Status: AC
  Filled 2016-03-23: qty 2

## 2016-03-23 MED ORDER — PHENOL 1.4 % MT LIQD: 1.0000 | OROMUCOSAL | Status: DC | PRN

## 2016-03-23 MED ORDER — VANCOMYCIN HCL 1000 MG IV SOLR
INTRAVENOUS | Status: DC | PRN
  Administered 2016-03-23: 1000 mg via TOPICAL

## 2016-03-23 MED ORDER — ONDANSETRON HCL 4 MG/2ML IJ SOLN
INTRAMUSCULAR | Status: AC
  Filled 2016-03-23: qty 2

## 2016-03-23 MED ORDER — DOCUSATE SODIUM 100 MG PO CAPS
100.0000 mg | ORAL_CAPSULE | Freq: Two times a day (BID) | ORAL | Status: DC
  Administered 2016-03-24 (×3): 100 mg via ORAL
  Filled 2016-03-23 (×3): qty 1

## 2016-03-23 MED ORDER — LACTATED RINGERS IV SOLN
INTRAVENOUS | Status: DC | PRN
  Administered 2016-03-23 (×3): via INTRAVENOUS

## 2016-03-23 MED ORDER — 0.9 % SODIUM CHLORIDE (POUR BTL) OPTIME
TOPICAL | Status: DC | PRN
  Administered 2016-03-23: 1000 mL

## 2016-03-23 MED ORDER — ROCURONIUM BROMIDE 100 MG/10ML IV SOLN
INTRAVENOUS | Status: DC | PRN
  Administered 2016-03-23 (×3): 10 mg via INTRAVENOUS
  Administered 2016-03-23: 50 mg via INTRAVENOUS

## 2016-03-23 MED ORDER — LACTATED RINGERS IV SOLN: INTRAVENOUS | Status: DC

## 2016-03-23 SURGICAL SUPPLY — 70 items
APL SKNCLS STERI-STRIP NONHPOA (GAUZE/BANDAGES/DRESSINGS) ×1
BAG DECANTER FOR FLEXI CONT (MISCELLANEOUS) ×3 IMPLANT
BASKET BONE COLLECTION (BASKET) ×2 IMPLANT
BENZOIN TINCTURE PRP APPL 2/3 (GAUZE/BANDAGES/DRESSINGS) ×3 IMPLANT
BLADE CLIPPER SURG (BLADE) IMPLANT
BUR MATCHSTICK NEURO 3.0 LAGG (BURR) ×3 IMPLANT
BUR PRECISION FLUTE 6.0 (BURR) ×3 IMPLANT
CANISTER SUCT 3000ML PPV (MISCELLANEOUS) ×3 IMPLANT
CAP REVERE LOCKING (Cap) ×12 IMPLANT
CARTRIDGE OIL MAESTRO DRILL (MISCELLANEOUS) ×1 IMPLANT
CLOSURE WOUND 1/2 X4 (GAUZE/BANDAGES/DRESSINGS) ×1
CONT SPEC 4OZ CLIKSEAL STRL BL (MISCELLANEOUS) ×5 IMPLANT
COVER BACK TABLE 60X90IN (DRAPES) ×4 IMPLANT
DIFFUSER DRILL AIR PNEUMATIC (MISCELLANEOUS) ×3 IMPLANT
DRAPE C-ARM 42X72 X-RAY (DRAPES) ×6 IMPLANT
DRAPE HALF SHEET 40X57 (DRAPES) ×1 IMPLANT
DRAPE LAPAROTOMY 100X72X124 (DRAPES) ×3 IMPLANT
DRAPE POUCH INSTRU U-SHP 10X18 (DRAPES) ×3 IMPLANT
DRAPE SURG 17X23 STRL (DRAPES) ×12 IMPLANT
ELECT BLADE 4.0 EZ CLEAN MEGAD (MISCELLANEOUS) ×3
ELECT REM PT RETURN 9FT ADLT (ELECTROSURGICAL) ×3
ELECTRODE BLDE 4.0 EZ CLN MEGD (MISCELLANEOUS) ×1 IMPLANT
ELECTRODE REM PT RTRN 9FT ADLT (ELECTROSURGICAL) ×1 IMPLANT
GAUZE SPONGE 4X4 12PLY STRL (GAUZE/BANDAGES/DRESSINGS) ×3 IMPLANT
GAUZE SPONGE 4X4 16PLY XRAY LF (GAUZE/BANDAGES/DRESSINGS) ×3 IMPLANT
GLOVE BIO SURGEON STRL SZ8 (GLOVE) ×6 IMPLANT
GLOVE BIO SURGEON STRL SZ8.5 (GLOVE) ×6 IMPLANT
GLOVE BIOGEL PI IND STRL 6.5 (GLOVE) IMPLANT
GLOVE BIOGEL PI IND STRL 7.5 (GLOVE) IMPLANT
GLOVE BIOGEL PI INDICATOR 6.5 (GLOVE) ×4
GLOVE BIOGEL PI INDICATOR 7.5 (GLOVE) ×6
GLOVE ECLIPSE 6.5 STRL STRAW (GLOVE) ×2 IMPLANT
GLOVE ECLIPSE 7.5 STRL STRAW (GLOVE) ×2 IMPLANT
GLOVE EXAM NITRILE LRG STRL (GLOVE) IMPLANT
GLOVE EXAM NITRILE XL STR (GLOVE) IMPLANT
GLOVE EXAM NITRILE XS STR PU (GLOVE) IMPLANT
GLOVE SURG SS PI 6.5 STRL IVOR (GLOVE) ×10 IMPLANT
GOWN STRL REUS W/ TWL LRG LVL3 (GOWN DISPOSABLE) IMPLANT
GOWN STRL REUS W/ TWL XL LVL3 (GOWN DISPOSABLE) ×2 IMPLANT
GOWN STRL REUS W/TWL 2XL LVL3 (GOWN DISPOSABLE) IMPLANT
GOWN STRL REUS W/TWL LRG LVL3 (GOWN DISPOSABLE) ×9
GOWN STRL REUS W/TWL XL LVL3 (GOWN DISPOSABLE) ×9
KIT BASIN OR (CUSTOM PROCEDURE TRAY) ×3 IMPLANT
KIT ROOM TURNOVER OR (KITS) ×3 IMPLANT
NDL HYPO 21X1.5 SAFETY (NEEDLE) IMPLANT
NEEDLE HYPO 21X1.5 SAFETY (NEEDLE) ×3 IMPLANT
NEEDLE HYPO 22GX1.5 SAFETY (NEEDLE) ×3 IMPLANT
NS IRRIG 1000ML POUR BTL (IV SOLUTION) ×3 IMPLANT
OIL CARTRIDGE MAESTRO DRILL (MISCELLANEOUS) ×3
PACK LAMINECTOMY NEURO (CUSTOM PROCEDURE TRAY) ×3 IMPLANT
PAD ARMBOARD 7.5X6 YLW CONV (MISCELLANEOUS) ×9 IMPLANT
PATTIES SURGICAL .5 X1 (DISPOSABLE) IMPLANT
PATTIES SURGICAL 1X1 (DISPOSABLE) ×2 IMPLANT
ROD REVERE CURVED 65MM (Rod) ×4 IMPLANT
SCREW REVERE 6.5X50MM (Screw) ×12 IMPLANT
SPACER ALTERA 10X31 8-12MM-8 (Spacer) ×4 IMPLANT
SPONGE LAP 4X18 X RAY DECT (DISPOSABLE) IMPLANT
SPONGE NEURO XRAY DETECT 1X3 (DISPOSABLE) IMPLANT
SPONGE SURGIFOAM ABS GEL 100 (HEMOSTASIS) ×3 IMPLANT
STRIP BIOACTIVE 20CC 25X100X8 (Miscellaneous) ×4 IMPLANT
STRIP CLOSURE SKIN 1/2X4 (GAUZE/BANDAGES/DRESSINGS) ×2 IMPLANT
SUT VIC AB 1 CT1 18XBRD ANBCTR (SUTURE) ×2 IMPLANT
SUT VIC AB 1 CT1 8-18 (SUTURE) ×6
SUT VIC AB 2-0 CP2 18 (SUTURE) ×6 IMPLANT
SYRINGE 20CC LL (MISCELLANEOUS) ×2 IMPLANT
TAPE CLOTH SURG 4X10 WHT LF (GAUZE/BANDAGES/DRESSINGS) ×2 IMPLANT
TOWEL GREEN STERILE (TOWEL DISPOSABLE) ×2 IMPLANT
TOWEL GREEN STERILE FF (TOWEL DISPOSABLE) ×2 IMPLANT
TRAY FOLEY W/METER SILVER 16FR (SET/KITS/TRAYS/PACK) ×3 IMPLANT
WATER STERILE IRR 1000ML POUR (IV SOLUTION) ×3 IMPLANT

## 2016-03-23 NOTE — Anesthesia Preprocedure Evaluation (Addendum)
Anesthesia Evaluation  Patient identified by MRN, date of birth, ID band Patient awake    Reviewed: Allergy & Precautions, NPO status , Patient's Chart, lab work & pertinent test results  History of Anesthesia Complications Negative for: history of anesthetic complications  Airway Mallampati: II  TM Distance: <3 FB Neck ROM: Full    Dental  (+) Teeth Intact, Dental Advisory Given, Partial Lower, Partial Upper,    Pulmonary shortness of breath, Current Smoker,    Pulmonary exam normal breath sounds clear to auscultation       Cardiovascular + Peripheral Vascular Disease (s/p aorto-bifem bypass)  Normal cardiovascular exam Rhythm:Regular Rate:Normal  Prev Aorto bifem   Neuro/Psych  Headaches, PSYCHIATRIC DISORDERS Depression Bipolar Disorder Schizophrenia    GI/Hepatic GERD  Controlled,(+)     substance abuse (Last use 3 days ago)  cocaine use,   Endo/Other  negative endocrine ROS  Renal/GU Renal InsufficiencyRenal disease     Musculoskeletal  (+) Arthritis , Osteoarthritis,    Abdominal   Peds  Hematology negative hematology ROS (+)   Anesthesia Other Findings Day of surgery medications reviewed with the patient.  Reproductive/Obstetrics                          Anesthesia Physical  Anesthesia Plan  ASA: III  Anesthesia Plan: General   Post-op Pain Management:    Induction: Intravenous  Airway Management Planned: Oral ETT  Additional Equipment: Arterial line  Intra-op Plan:   Post-operative Plan: Extubation in OR  Informed Consent: I have reviewed the patients History and Physical, chart, labs and discussed the procedure including the risks, benefits and alternatives for the proposed anesthesia with the patient or authorized representative who has indicated his/her understanding and acceptance.   Dental advisory given  Plan Discussed with:   Anesthesia Plan Comments:        Anesthesia Quick Evaluation

## 2016-03-23 NOTE — H&P (Signed)
Subjective: The patient is a 56 year old black male who's had previous surgery by another physician about 2 years ago. He's had chronic and worsening back pain. He has failed medical management. He was worked up with a lumbar MRI which demonstrated disc degeneration and recurrent herniated disc at L4-5 and L5-S1. I discussed the various treatment options with the patient including surgery. He has weighed the risks, benefits, and alternatives to surgery and decided proceed with a 2 level lumbar fusion.   Past Medical History:  Diagnosis Date  . Arthritis   . Bipolar affective (HCC)   . Chronic back pain    and right hip  . Depression   . GERD (gastroesophageal reflux disease)    occ tums  . Headache(784.0)    migraines  . Hip pain   . 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 Peripheral vascular disease (HCC)   . Schizophrenia [facility])     Past Surgical History:  Procedure Laterality Date  . AORTA - BILATERAL FEMORAL ARTERY BYPASS GRAFT N/A 10/07/2013   Procedure: AORTA BIFEMORAL BYPASS GRAFT;  Surgeon: Sherren Kerns, MD;  Location: [facility] OR;  Service: Vascular;  Laterality: N/A;  . ESOPHAGEAL DILATION    . ESOPHAGOGASTRODUODENOSCOPY  09/06/2011   Procedure: ESOPHAGOGASTRODUODENOSCOPY (EGD);  Surgeon: Vertell Novak., MD;  [facility];  Service: Endoscopy;  Laterality: N/A;  . FRACTURE SURGERY     left fingers # 4,5  . JOINT REPLACEMENT Bilateral    rt hip replacement, and  LT hip  . LUMBAR LAMINECTOMY N/A 05/05/2014   Procedure: BILATERAL LUMBAR LAMINECTOMY L4-5 AND L5-S1;  Surgeon: Kerrin Champagne, MD;  Location: [facility] OR;  Service: Orthopedics;  Laterality: N/A;  . MANDIBLE FRACTURE SURGERY    . SAVORY DILATION  09/06/2011   Procedure: SAVORY DILATION;  Surgeon: Vertell Novak., MD;  [facility];  Service: Endoscopy;  Laterality: N/A;  . TOTAL HIP ARTHROPLASTY Left 04/17/2012   Procedure: LEFT  TOTAL HIP ARTHROPLASTY ANTERIOR APPROACH;  Surgeon: Kathryne Hitch, MD;  Location: [facility] OR;  Service: Orthopedics;  Laterality: Left;    Allergies  Allergen Reactions  . Ibuprofen Other (See Comments)    Gastric irritation  . Tramadol Itching    Social History  Substance Use Topics  . Smoking status: Current Every Day Smoker    Packs/day: 0.20    Years: 34.00    Types: Cigarettes  . Smokeless tobacco: Never Used  . Alcohol use Yes     Comment: once a week     Family History  Problem Relation Age of Onset  . Diabetes Mother   . Diabetes Father   . Diabetes Other   . Diabetes Brother    Prior to Admission medications   Medication Sig Start Date End Date Taking? Authorizing Provider  Ascorbic Acid (VITAMIN C PO) Take 1 tablet by mouth 2 (two) times daily.   Yes Historical Provider, MD     Review of Systems  Positive ROS: As above  All other systems have been reviewed and were otherwise negative with the exception of those mentioned in the HPI and as above.  Objective: Vital signs in last 24 hours: Temp:  [97.7 F (36.5 C)] 97.7 F (36.5 C) (03/14 0913) Pulse Rate:  [66] 66 (03/14 0913) Resp:  [20] 20 (03/14 0913) BP: (125)/(88) 125/88 (03/14 0913) SpO2:  [100 %] 100 % (03/14 0913) Weight:  [61.7 kg (136 lb)]  61.7 kg (136 lb) (03/14 0913)  General Appearance: Alert Head: Normocephalic, without obvious abnormality, atraumatic Eyes: PERRL, conjunctiva/corneas clear, EOM's intact,    Ears: Normal  Throat: Normal  Neck: Supple, Back: unremarkable. The patient's lumbar incision is well-healed. Lungs: Clear to auscultation bilaterally, respirations unlabored Heart: Regular rate and rhythm, no murmur, rub or gallop Abdomen: Soft, non-tender Extremities: Extremities normal, atraumatic, no cyanosis or edema Skin: unremarkable  NEUROLOGIC:   Mental status: alert and oriented,Motor Exam - grossly normal Sensory Exam - grossly normal Reflexes:  Coordination -  grossly normal Gait - grossly normal Balance - grossly normal Cranial Nerves: I: smell Not tested  II: visual acuity  OS: Normal  OD: Normal   II: visual fields Full to confrontation  II: pupils Equal, round, reactive to light  III,VII: ptosis None  III,IV,VI: extraocular muscles  Full ROM  V: mastication Normal  V: facial light touch sensation  Normal  V,VII: corneal reflex  Present  VII: facial muscle function - upper  Normal  VII: facial muscle function - lower Normal  VIII: hearing Not tested  IX: soft palate elevation  Normal  IX,X: gag reflex Present  XI: trapezius strength  5/5  XI: sternocleidomastoid strength 5/5  XI: neck flexion strength  5/5  XII: tongue strength  Normal    Data Review Lab Results  Component Value Date   WBC 8.4 03/17/2016   HGB 15.9 03/17/2016   HCT 46.8 03/17/2016   MCV 99.2 03/17/2016   PLT 199 03/17/2016   Lab Results  Component Value Date   NA 142 03/17/2016   K 4.6 03/17/2016   CL 110 03/17/2016   CO2 26 03/17/2016   BUN 15 03/17/2016   CREATININE 1.31 (H) 03/17/2016   GLUCOSE 104 (H) 03/17/2016   Lab Results  Component Value Date   INR 1.13 10/07/2013    Assessment/Plan: L4-5 and L5-S1 degenerative disc disease, recurrent herniated disc, lumbago, lumbar radiculopathy: I have discussed the situation with the patient. I have reviewed his imaging studies with him and pointed out the abnormalities. We have discussed the various treatment options including surgery. I have described the surgical treatment option of an L4-5 and L5-S1 redo laminectomy, discectomy decompression, instrumentation, and fusion. I have shown him surgical models. We have discussed the risks, benefits, alternatives, expected postoperative course, and likelihood of achieving goals with surgery. I have answered all the patient's questions. He has decided to proceed with surgery.   Desmond Tufano D 03/23/2016 11:12 AM

## 2016-03-23 NOTE — Anesthesia Procedure Notes (Signed)
Procedure Name: Intubation Date/Time: 03/23/2016 11:30 AM Performed by: Eligha Bridegroom Pre-anesthesia Checklist: Patient identified, Emergency Drugs available, Suction available, Patient being monitored and Timeout performed Patient Re-evaluated:Patient Re-evaluated prior to inductionOxygen Delivery Method: Circle system utilized Preoxygenation: Pre-oxygenation with 100% oxygen Intubation Type: IV induction Ventilation: Mask ventilation without difficulty and Oral airway inserted - appropriate to patient size Laryngoscope Size: Mac and 4 Grade View: Grade I Tube type: Oral Tube size: 8.0 mm Number of attempts: 1 Airway Equipment and Method: Stylet Placement Confirmation: ETT inserted through vocal cords under direct vision,  positive ETCO2 and breath sounds checked- equal and bilateral Secured at: 22 cm Tube secured with: Tape Dental Injury: Teeth and Oropharynx as per pre-operative assessment

## 2016-03-23 NOTE — Anesthesia Procedure Notes (Signed)
Arterial Line Insertion Start/End3/14/2018 10:00 AM, 03/23/2016 10:04 AM Performed by: Cecile HearingURK, Tijana Walder EDWARD, anesthesiologist  Patient location: Pre-op. Preanesthetic checklist: patient identified, IV checked, site marked, risks and benefits discussed, surgical consent, monitors and equipment checked, pre-op evaluation, timeout performed and anesthesia consent Lidocaine 1% used for infiltration Left, radial was placed Catheter size: 20 Fr Hand hygiene performed  and maximum sterile barriers used   Attempts: 1 Procedure performed using ultrasound guided technique. Ultrasound Notes:anatomy identified, needle tip was noted to be adjacent to the nerve/plexus identified, no ultrasound evidence of intravascular and/or intraneural injection and image(s) printed for medical record Following insertion, dressing applied and Biopatch. Post procedure assessment: normal and unchanged  Patient tolerated the procedure well with no immediate complications.

## 2016-03-23 NOTE — Transfer of Care (Signed)
Immediate Anesthesia Transfer of Care Note  Patient: James Stevens  Procedure(s) Performed: Procedure(s): POSTERIOR LUMBAR INTERBODY FUSION, INTERBODY PROSTHESIS,POSTERIOR LATERAL ARTHRODESIS,POSTERIOR SEGMENTAL INSTRUMENTATION LUMBAR FOUR- LUMBAR FIVE, LUMBAR FIVE- SACRAL ONE (N/A)  Patient Location: PACU  Anesthesia Type:General  Level of Consciousness: awake, alert  and oriented  Airway & Oxygen Therapy: Patient Spontanous Breathing and Patient connected to nasal cannula oxygen  Post-op Assessment: Report given to RN and Post -op Vital signs reviewed and stable  Post vital signs: Reviewed and stable  Last Vitals:  Vitals:   03/23/16 0913 03/23/16 1644  BP: 125/88   Pulse: 66   Resp: 20   Temp: 36.5 C (P) 37.2 C    Last Pain:  Vitals:   03/23/16 0913  TempSrc: Oral  PainSc:          Complications: No apparent anesthesia complications

## 2016-03-23 NOTE — Progress Notes (Addendum)
Orthopedic Tech Progress Note Patient Details:  James AnchorsDaniel J [facility] 07/20/1960 [id number] Patient has brace Patient ID: James Anchorsaniel J [facility], male   DOB: 08/26/1960, 56 y.o.   MRN: [id number] RN called back and informed me that patient did not have brace, order will be call in 3/15  Jennye MoccasinHughes, Callie Bunyard Craig 03/23/2016, 7:34 PM

## 2016-03-23 NOTE — Progress Notes (Signed)
Patient ID: James Stevens, male   DOB: 10/30/1960, 56 y.o.   MRN: [id number] Subjective:  The patient is somnolent but arousable. He is in no apparent distress.  Objective: Vital signs in last 24 hours: Temp:  [97.7 F (36.5 C)-98.9 F (37.2 C)] 98.9 F (37.2 C) (03/14 1644) Pulse Rate:  [66-86] 86 (03/14 1643) Resp:  [20-29] 29 (03/14 1643) BP: (125-174)/(88-112) 174/112 (03/14 1643) SpO2:  [99 %-100 %] 99 % (03/14 1643) Arterial Line BP: (184)/(92) 184/92 (03/14 1643) Weight:  [61.7 kg (136 lb)] 61.7 kg (136 lb) (03/14 0913)  Intake/Output from previous day: No intake/output data recorded. Intake/Output this shift: Total I/O In: 2400 [I.V.:2400] Out: 710 [Urine:455; Blood:255]  Physical exam the patient is somnolent but arousable. He is moving his lower extremities well.  Lab Results: No results for input(s): WBC, HGB, HCT, PLT in the last 72 hours. BMET No results for input(s): NA, K, CL, CO2, GLUCOSE, BUN, CREATININE, CALCIUM in the last 72 hours.  Studies/Results: Dg Lumbar Spine 2-3 Views  Result Date: 03/23/2016 CLINICAL DATA:  L4-S1 PLIF EXAM: DG C-ARM 61-120 MIN; LUMBAR SPINE - 2-3 VIEW COMPARISON:  03/23/2016 FLUOROSCOPY TIME:  Fluoroscopy Time:  13 seconds Radiation Exposure Index (if provided by the fluoroscopic device): Not available Number of Acquired Spot Images: 2 FINDINGS: Pedicle screws are noted at L4, L5 and S1 with interbody fusion at both levels. The numbering nomenclature is similar to that utilized on prior imaging. IMPRESSION: L4-S1 PLIF Electronically Signed   By: Alcide CleverMark  Lukens M.D.   On: 03/23/2016 15:59   Dg Lumbar Spine 1 View  Result Date: 03/23/2016 CLINICAL DATA:  L4-L5-S1 PLIF. EXAM: LUMBAR SPINE - 1 VIEW COMPARISON:  Multiple exams, including 11/24/2015 FINDINGS: Numbering is performed to reflect fat on the prior lumbar spine MRI from 11/04/2015. The lowest lumbar type non-rib-bearing vertebra is labeled L5. Tissue spreaders are in place  posterior to the L5 vertebra. A curved tip probe is present at the L4-5 interspinous space oriented towards the L4-5 level. IMPRESSION: 1. Probe localization of L4-5. Electronically Signed   By: Gaylyn RongWalter  Liebkemann M.D.   On: 03/23/2016 14:37   Dg C-arm 1-60 Min  Result Date: 03/23/2016 CLINICAL DATA:  L4-S1 PLIF EXAM: DG C-ARM 61-120 MIN; LUMBAR SPINE - 2-3 VIEW COMPARISON:  03/23/2016 FLUOROSCOPY TIME:  Fluoroscopy Time:  13 seconds Radiation Exposure Index (if provided by the fluoroscopic device): Not available Number of Acquired Spot Images: 2 FINDINGS: Pedicle screws are noted at L4, L5 and S1 with interbody fusion at both levels. The numbering nomenclature is similar to that utilized on prior imaging. IMPRESSION: L4-S1 PLIF Electronically Signed   By: Alcide CleverMark  Lukens M.D.   On: 03/23/2016 15:59    Assessment/Plan: The patient is doing well. I spoke with his sister. I will ask Dr. Mikal Planeabell the see the patient in my absence.  LOS: 0 days     Samad Thon D 03/23/2016, 4:53 PM

## 2016-03-23 NOTE — Op Note (Signed)
Brief history: The patient is a 56 year old black male who's had prior lumbar surgery by another physician about 2 years ago. He has had chronic and worsening back pain. He was worked up with a lumbar MRI which demonstrated disc degeneration and recurrent ruptured disks at L4-5 and L5-S1. I discussed the various treatment options with the patient including surgery. He has weighed the risks, benefits, and alternative surgery and decided proceed with an L4-5 and L5-S1 decompression, instrumentation, and fusion.  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 Etc. requiring a wide decompression of the nerve roots.); L4-5 and L5-S1 transforaminal lumbar interbody fusion with local morselized autograft bone and Kinnex graft extender; insertion of interbody prosthesis at L4-5 and L5-S1 (globus peek expandable interbody prosthesis); posterior segmental instrumentation from L4 to S1 with globus titanium pedicle screws and rods; posterior lateral arthrodesis at L4-5 and L5-S1 bilaterally with local morselized autograft bone and Kinnex bone graft extender.  Surgeon: Dr. Delma Officer  Asst.: Dr. Mikal Plane  Anesthesia: Gen. endotracheal  Estimated blood loss: 250 mL  Drains: None  Complications: None  Description of procedure: The patient was brought to the operating room by the anesthesia team. General endotracheal anesthesia was induced. The patient was turned to the prone position on the Wilson frame. The patient's lumbosacral region  was then prepared with Betadine scrub and Betadine solution. Sterile drapes were applied.  I then injected the area to be incised with Marcaine with epinephrine solution. I then used the scalpel to make a linear midline incision over the L4-5 and L5-S1 interspace. I then used electrocautery to perform a bilateral subperiosteal dissection exposing the spinous process and lamina of L4, L5 and S1 bilaterally. We then obtained intraoperative radiograph to confirm our location. We then inserted the Verstrac retractor to provide exposure.  I began the decompression by using the high speed drill to perform laminotomies at L4-5 and L5-S1 bilaterally. We then used the Kerrison punches to widen the laminotomy and removed the ligamentum flavum at L4-5 and L5-S1 bilaterally. We used the Kerrison punches to remove the medial facets at L4-5 and L5-S1 bilaterally. We performed wide foraminotomies about the bilateral L4, L5 and S1 nerve roots completing the decompression.  We now turned our attention to the posterior lumbar interbody fusion. I used a scalpel to incise the intervertebral disc at L4-5 and L5-S1 bilaterally. I then performed a partial intervertebral discectomy at L4-5 and L5-S1 bilaterally using the pituitary forceps. We prepared the vertebral endplates at L4-5 and L5-S1 bilaterally for the fusion by removing the soft tissues with the curettes. We then used the trial spacers to pick the appropriate sized interbody prosthesis. We prefilled his prosthesis with a combination of local morselized autograft bone that we obtained during the decompression as well as Kinnex bone graft extender. We inserted the prefilled prosthesis into the interspace at L4-5 and L5-S1 we then expanded the prosthesis. There was a good snug fit of the prosthesis in the interspace. We then filled and the remainder of the intervertebral disc space with local morselized autograft bone and Kinnex. This completed the posterior lumbar interbody  arthrodesis.  We now turned attention to the instrumentation. Under fluoroscopic guidance we cannulated the bilateral L4, L5 and S1 pedicles with the bone probe. We then removed the bone probe. We then tapped the pedicle with a 5.5 millimeter tap. We then removed the tap. We probed inside the tapped pedicle with a ball probe to rule out cortical breaches. We then inserted a 6.5 x 50 millimeter pedicle screw into the L4, L5 and S1 pedicles bilaterally under fluoroscopic guidance. We then palpated along the medial aspect of the pedicles to rule out cortical breaches. There were none. The nerve roots were not injured. We then connected the unilateral pedicle screws with a lordotic rod. We compressed the construct and secured the rod in place with the caps. We then tightened the caps appropriately. This completed the instrumentation from L4-S1 bilaterally.  We now turned our attention to the posterior lateral arthrodesis at L4-5 and L5-S1. We used the high-speed drill to decorticate the remainder of the facets, pars, transverse process at L4-5 and L5-S1 bilaterally. We then applied a combination of local morselized autograft bone and Kinnex bone graft extender over these decorticated posterior lateral structures. This completed the posterior lateral arthrodesis.  We then obtained hemostasis using bipolar electrocautery. We irrigated the wound out with bacitracin solution. We inspected the thecal sac and nerve roots and noted they were well decompressed. We then removed the retractor. We placed vancomycin powder in the wound. We reapproximated patient's thoracolumbar fascia with interrupted #1 Vicryl suture. We reapproximated patient's subcutaneous tissue with interrupted 2-0 Vicryl suture. The reapproximated patient's skin with Steri-Strips and benzoin. The wound was then coated with bacitracin ointment. A sterile dressing was applied. The drapes were removed. The patient was subsequently returned to the supine  position where they were extubated by the anesthesia team. He was then transported to the post anesthesia care unit in stable condition. All sponge instrument and needle counts were reportedly correct at the end of this case.

## 2016-03-24 LAB — BASIC METABOLIC PANEL
Anion gap: 10 (ref 5–15)
BUN: 8 mg/dL (ref 6–20)
CO2: 25 mmol/L (ref 22–32)
CREATININE: 1.22 mg/dL (ref 0.61–1.24)
Calcium: 9.1 mg/dL (ref 8.9–10.3)
Chloride: 103 mmol/L (ref 101–111)
GFR calc non Af Amer: >60 mL/min (ref >60)
Glucose, Bld: 139 mg/dL — ABNORMAL HIGH (ref 65–99)
POTASSIUM: 4.4 mmol/L (ref 3.5–5.1)
SODIUM: 138 mmol/L (ref 135–145)

## 2016-03-24 LAB — CBC
HCT: 41.1 % (ref 39.0–52.0)
HEMOGLOBIN: 13.7 g/dL (ref 13.0–17.0)
MCH: 32.6 pg (ref 26.0–34.0)
MCHC: 33.3 g/dL (ref 30.0–36.0)
MCV: 97.9 fL (ref 78.0–100.0)
Platelets: 156 10*3/uL (ref 150–400)
RBC: 4.2 MIL/uL — AB (ref 4.22–5.81)
RDW: 13.8 % (ref 11.5–15.5)
WBC: 15.8 10*3/uL — ABNORMAL HIGH (ref 4.0–10.5)

## 2016-03-24 MED ORDER — OXYCODONE-ACETAMINOPHEN 10-325 MG PO TABS: 1.0000 | ORAL_TABLET | ORAL | 0 refills | Status: DC | PRN

## 2016-03-24 MED ORDER — DOCUSATE SODIUM 100 MG PO CAPS: 100.0000 mg | ORAL_CAPSULE | Freq: Two times a day (BID) | ORAL | 0 refills | Status: DC

## 2016-03-24 MED ORDER — CYCLOBENZAPRINE HCL 10 MG PO TABS: 10.0000 mg | ORAL_TABLET | Freq: Three times a day (TID) | ORAL | 1 refills | Status: DC | PRN

## 2016-03-24 NOTE — Evaluation (Signed)
Physical Therapy Evaluation Patient Details Name: James AnchorsDaniel J Stevens MRN: [id number] DOB: 08/20/1960 Today's Date: 03/24/2016   History of Present Illness  Pt is a 56 y/o male who presents s/p L4-S1 posterior lumbar fusion on 03/23/16.   Clinical Impression  Pt admitted with above diagnosis. Pt currently with functional limitations due to the deficits listed below (see PT Problem List). At the time of PT eval pt was very painful and generally moving slow and guarded. Feel pt will progress well as pain is managed. Pt will benefit from skilled PT to increase their independence and safety with mobility to allow discharge to the venue listed below.       Follow Up Recommendations Outpatient PT;Supervision for mobility/OOB    Equipment Recommendations  Rolling walker with 5" wheels;3in1 (PT)    Recommendations for Other Services       Precautions / Restrictions Precautions Precautions: Fall;Back Precaution Booklet Issued: Yes (comment) Precaution Comments: Reviewed with pt and wife. Pt was cued for precautions during functional mobility.  Required Braces or Orthoses: Spinal Brace (Not present - per RN ok to ambulate without) Restrictions Weight Bearing Restrictions: No      Mobility  Bed Mobility               General bed mobility comments: Pt was received exiting bathroom. Reviewed log roll verbally however didnt practice with this therapist.   Transfers Overall transfer level: Needs assistance Equipment used: Rolling walker (2 wheeled) Transfers: Sit to/from Stand Sit to Stand: Min guard         General transfer comment: Close guard for safety as pt powered up to full standing position. Pt was cued for hand placement on seated surface for safety.   Ambulation/Gait Ambulation/Gait assistance: Min guard Ambulation Distance (Feet): 100 Feet Assistive device: Rolling walker (2 wheeled) Gait Pattern/deviations: Step-through pattern;Decreased stride length;Trunk  flexed Gait velocity: Decreased Gait velocity interpretation: Below normal speed for age/gender General Gait Details: VC's for improved posture. Pt moving very slow and guarded with tension noted in shoulders (elevated with stiff arms). Able to correct with cues but unable to maintain.   Stairs            Wheelchair Mobility    Modified Rankin (Stroke Patients Only)       Balance Overall balance assessment: Needs assistance Sitting-balance support: Feet supported;No upper extremity supported Sitting balance-Leahy Scale: Fair     Standing balance support: Bilateral upper extremity supported;During functional activity Standing balance-Leahy Scale: Poor                               Pertinent Vitals/Pain Pain Assessment: 0-10 Pain Score: 8  Pain Location: Incision site Pain Descriptors / Indicators: Operative site guarding Pain Intervention(s): Limited activity within patient's tolerance;Monitored during session;Repositioned    Home Living Family/patient expects to be discharged to:: Private residence Living Arrangements: Spouse/significant other Available Help at Discharge: Family;Available 24 hours/day Type of Home: House Home Access: Stairs to enter   [employer] CorporationEntrance Stairs-Number of Steps: 1 Home Layout: One level Home Equipment: None      Prior Function Level of Independence: Independent               Hand Dominance   Dominant Hand: Right    Extremity/Trunk Assessment   Upper Extremity Assessment Upper Extremity Assessment: Defer to OT evaluation    Lower Extremity Assessment Lower Extremity Assessment: Generalized weakness;LLE deficits/detail LLE Deficits / Details: Reported nerve pain  radiating down leg while sitting at end of session. Improved with propping the LLE up slightly LLE: Unable to fully assess due to pain    Cervical / Trunk Assessment Cervical / Trunk Assessment: Other exceptions Cervical / Trunk Exceptions: s/p surgery   Communication   Communication: No difficulties  Cognition Arousal/Alertness: Lethargic;Suspect due to medications Behavior During Therapy: [facility] for tasks assessed/performed Overall Cognitive Status: Within Functional Limits for tasks assessed                      General Comments      Exercises     Assessment/Plan    PT Assessment Patient needs continued PT services  PT Problem List Decreased strength;Decreased activity tolerance;Decreased balance;Decreased mobility;Decreased knowledge of use of DME;Decreased safety awareness;Decreased knowledge of precautions;Pain       PT Treatment Interventions DME instruction;Gait training;Stair training;Functional mobility training;Therapeutic activities;Therapeutic exercise;Neuromuscular re-education;Patient/family education    PT Goals (Current goals can be found in the Care Plan section)  Acute Rehab PT Goals Patient Stated Goal: Decrease pain PT Goal Formulation: With patient/family Time For Goal Achievement: 03/31/16 Potential to Achieve Goals: Good    Frequency Min 5X/week   Barriers to discharge        Co-evaluation               End of Session Equipment Utilized During Treatment: Gait belt Activity Tolerance: Patient limited by pain;Patient limited by fatigue Patient left: in chair;with call bell/phone within reach;with family/visitor present Nurse Communication: Mobility status PT Visit Diagnosis: Other abnormalities of gait and mobility (R26.89);Pain Pain - part of body:  (Back)         Time: 4098-1191 PT Time Calculation (min) (ACUTE ONLY): 30 min   Charges:   PT Evaluation $PT Eval Moderate Complexity: 1 Procedure PT Treatments $Gait Training: 8-22 mins   PT G Codes:         Marylynn Pearson 03/24/2016, 9:08 AM  Conni Slipper, PT, DPT Acute Rehabilitation Services Pager: [phone]

## 2016-03-24 NOTE — Anesthesia Postprocedure Evaluation (Signed)
Anesthesia Post Note  Patient: James Stevens  Procedure(s) Performed: Procedure(s) (LRB): POSTERIOR LUMBAR INTERBODY FUSION, INTERBODY PROSTHESIS,POSTERIOR LATERAL ARTHRODESIS,POSTERIOR SEGMENTAL INSTRUMENTATION LUMBAR FOUR- LUMBAR FIVE, LUMBAR FIVE- SACRAL ONE (N/A)  Patient location during evaluation: PACU Anesthesia Type: General Level of consciousness: awake and alert Pain management: pain level controlled Vital Signs Assessment: post-procedure vital signs reviewed and stable Respiratory status: spontaneous breathing, nonlabored ventilation, respiratory function stable and patient connected to nasal cannula oxygen Cardiovascular status: blood pressure returned to baseline and stable Postop Assessment: no signs of nausea or vomiting Anesthetic complications: no       Last Vitals:  Vitals:   03/24/16 0832 03/24/16 1639  BP: 133/80 127/76  Pulse: 99 (!) 110  Resp: 18 18  Temp: 37.3 C     Last Pain:  Vitals:   03/24/16 1430  TempSrc:   PainSc: Asleep                 Cecile HearingStephen Edward Pier Bosher

## 2016-03-24 NOTE — Progress Notes (Signed)
Orthopedic Tech Progress Note Patient Details:  James AnchorsDaniel J Tep 12/02/1960 [id number] Called [employer] for brace. Patient ID: James Anchorsaniel J Stevens, male   DOB: 02/09/1960, 56 y.o.   MRN: [id number]   James MoccasinHughes, James Stevens James Stevens 03/24/2016, 2:50 PM

## 2016-03-24 NOTE — Discharge Summary (Signed)
Physician Discharge Summary  Patient ID: James AnchorsDaniel J Askin MRN: [id number] DOB/AGE: 56/08/1960 56 y.o.  Admit date: 03/23/2016 Discharge date: 03/24/2016  Admission Diagnoses:L4-5 and L5-S1 degenerative disc disease, lumbago, recurrent hernia disc, lumbar radiculopathy  Discharge Diagnoses: The same Active Problems:   Lumbar herniated disc   Discharged Condition: good  Hospital Course: I performed an L4-5 and L5-S1 decompression, instrumentation, and fusion on the patient on 03/23/2016. The surgery went well.  The patient's postoperative course is unremarkable. The plan is to mobilize him today and likely send him home tomorrow. I gave him his discharge instructions and answered all his questions.  Consults: Physical therapy Significant Diagnostic Studies: None Treatments: Redo L4-5 and L5-S1 decompression, instrumentation, and fusion. Discharge Exam: Blood pressure 133/80, pulse 99, temperature 99.1 F (37.3 C), resp. rate 18, height 5\' 10"  (1.778 m), weight 62 kg (136 lb 11 oz), SpO2 94 %. The patient is alert and pleasant. His strength is grossly normal his lower extremities.  Disposition: Home      Signed: Cristi LoronJENKINS,Council [place] D 03/24/2016, 12:45 PM

## 2016-03-25 MED FILL — Heparin Sodium (Porcine) Inj 1000 Unit/ML: INTRAMUSCULAR | Qty: 30 | Status: AC

## 2016-03-25 MED FILL — Sodium Chloride IV Soln 0.9%: INTRAVENOUS | Qty: 2000 | Status: AC

## 2016-03-25 NOTE — Progress Notes (Signed)
Patient DC'd home via car with significant other.  DC instructions and prescription given to patient and both fully understood.  Vital signs and assessments were stable.

## 2016-03-25 NOTE — Progress Notes (Signed)
Physical Therapy Treatment Patient Details Name: James AnchorsDaniel J Stevens MRN: [id number] DOB: 03/10/1960 Today's Date: 03/25/2016    History of Present Illness Pt is a 56 y/o male who presents s/p L4-S1 posterior lumbar fusion on 03/23/16.     PT Comments    Pt progressing towards physical therapy goals. Was able to perform transfers and ambulation with gross min guard assist with VC's for safety and maintenance of precautions. Pt anticipates d/c home today however continues to appear very limited by pain.  Will continue to follow.   Follow Up Recommendations  Outpatient PT;Supervision for mobility/OOB     Equipment Recommendations  Rolling walker with 5" wheels;3in1 (PT)    Recommendations for Other Services       Precautions / Restrictions Precautions Precautions: Fall;Back Precaution Booklet Issued: Yes (comment) Precaution Comments: Reviewed with pt and wife. Pt was cued for precautions during functional mobility.  Required Braces or Orthoses: Spinal Brace (Not present - per RN ok to ambulate without) Restrictions Weight Bearing Restrictions: No    Mobility  Bed Mobility Overal bed mobility: Needs Assistance Bed Mobility: Rolling;Sidelying to Sit;Sit to Sidelying Rolling: Supervision Sidelying to sit: Min guard     Sit to sidelying: Min guard General bed mobility comments: VC's for proper log roll technique. HOB flat and rails lowered to simulate home environment.   Transfers Overall transfer level: Needs assistance Equipment used: Rolling walker (2 wheeled) Transfers: Sit to/from Stand Sit to Stand: Min guard         General transfer comment: Close guard for safety as pt powered up to full standing position. Pt was cued for hand placement on seated surface for safety.   Ambulation/Gait Ambulation/Gait assistance: Min guard Ambulation Distance (Feet): 120 Feet Assistive device: Rolling walker (2 wheeled) Gait Pattern/deviations: Step-through pattern;Decreased  stride length;Trunk flexed Gait velocity: Decreased Gait velocity interpretation: Below normal speed for age/gender General Gait Details: VC's for improved posture. Pt moving very slow and guarded with tension noted in shoulders (elevated with stiff arms). Able to correct with cues but unable to maintain.    Stairs            Wheelchair Mobility    Modified Rankin (Stroke Patients Only)       Balance Overall balance assessment: Needs assistance Sitting-balance support: Feet supported;No upper extremity supported Sitting balance-Leahy Scale: Fair     Standing balance support: Bilateral upper extremity supported;During functional activity Standing balance-Leahy Scale: Poor                      Cognition Arousal/Alertness: Lethargic;Suspect due to medications [facility] tasks assessed/performed Overall Cognitive Status: Within Functional Limits for tasks assessed                      Exercises      General Comments        Pertinent Vitals/Pain Pain Assessment: Faces Faces Pain Scale: Hurts little more Pain Location: Incision site Pain Descriptors / Indicators: Operative site guarding Pain Intervention(s): Limited activity within patient's tolerance;Monitored during session;Repositioned    Home Living                      Prior Function            PT Goals (current goals can now be found in the care plan section) Acute Rehab PT Goals Patient Stated Goal: Decrease pain PT Goal Formulation: With patient/family Time For Goal Achievement: 03/31/16 Potential  to Achieve Goals: Good Progress towards PT goals: Progressing toward goals    Frequency    Min 5X/week      PT Plan Current plan remains appropriate    Co-evaluation             End of Session Equipment Utilized During Treatment: Gait belt Activity Tolerance: Patient limited by pain;Patient limited by fatigue Patient left: in chair;with call  bell/phone within reach;with family/visitor present Nurse Communication: Mobility status PT Visit Diagnosis: Other abnormalities of gait and mobility (R26.89);Pain Pain - part of body:  (Back)     Time: 1610-9604 PT Time Calculation (min) (ACUTE ONLY): 25 min  Charges:  $Gait Training: 23-37 mins                    G Codes:       Marylynn Pearson 04-14-16, 2:26 PM   Conni Slipper, PT, DPT Acute Rehabilitation Services Pager: [phone]

## 2016-04-08 ENCOUNTER — Encounter (HOSPITAL_COMMUNITY): Payer: Self-pay | Admitting: Emergency Medicine

## 2016-04-08 DIAGNOSIS — Z79899 Other long term (current) drug therapy: Secondary | ICD-10-CM | POA: Insufficient documentation

## 2016-04-08 DIAGNOSIS — Z96642 Presence of left artificial hip joint: Secondary | ICD-10-CM | POA: Diagnosis not present

## 2016-04-08 DIAGNOSIS — F17210 Nicotine dependence, cigarettes, uncomplicated: Secondary | ICD-10-CM | POA: Insufficient documentation

## 2016-04-08 DIAGNOSIS — R1030 Lower abdominal pain, unspecified: Secondary | ICD-10-CM | POA: Diagnosis present

## 2016-04-08 DIAGNOSIS — R591 Generalized enlarged lymph nodes: Secondary | ICD-10-CM | POA: Insufficient documentation

## 2016-04-08 NOTE — ED Triage Notes (Signed)
C/o L groin pain x 1 hour.  States he noticed a "knot" on L groin just pta.

## 2016-04-09 ENCOUNTER — Emergency Department (HOSPITAL_COMMUNITY)
Admission: EM | Admit: 2016-04-09 | Discharge: 2016-04-09 | Disposition: A | Discharge status: Home / Self Care | Payer: Medicaid Other | Attending: Emergency Medicine | Admitting: Emergency Medicine

## 2016-04-09 ENCOUNTER — Encounter (HOSPITAL_COMMUNITY): Payer: Self-pay

## 2016-04-09 ENCOUNTER — Emergency Department (HOSPITAL_COMMUNITY): Payer: Medicaid Other

## 2016-04-09 DIAGNOSIS — R591 Generalized enlarged lymph nodes: Secondary | ICD-10-CM

## 2016-04-09 LAB — CBC WITH DIFFERENTIAL/PLATELET
BASOS PCT: 0 %
Basophils Absolute: 0 10*3/uL (ref 0.0–0.1)
EOS ABS: 0.1 10*3/uL (ref 0.0–0.7)
EOS PCT: 1 %
HCT: 36.1 % — ABNORMAL LOW (ref 39.0–52.0)
Hemoglobin: 12.1 g/dL — ABNORMAL LOW (ref 13.0–17.0)
Lymphocytes Relative: 21 %
Lymphs Abs: 2.6 10*3/uL (ref 0.7–4.0)
MCH: 32.4 pg (ref 26.0–34.0)
MCHC: 33.5 g/dL (ref 30.0–36.0)
MCV: 96.8 fL (ref 78.0–100.0)
MONO ABS: 0.8 10*3/uL (ref 0.1–1.0)
Monocytes Relative: 6 %
Neutro Abs: 9.2 10*3/uL — ABNORMAL HIGH (ref 1.7–7.7)
Neutrophils Relative %: 72 %
PLATELETS: 336 10*3/uL (ref 150–400)
RBC: 3.73 MIL/uL — AB (ref 4.22–5.81)
RDW: 13.5 % (ref 11.5–15.5)
WBC: 12.7 10*3/uL — AB (ref 4.0–10.5)

## 2016-04-09 LAB — I-STAT CHEM 8, ED
BUN: 19 mg/dL (ref 6–20)
Calcium, Ion: 1.2 mmol/L (ref 1.15–1.40)
Chloride: 102 mmol/L (ref 101–111)
Creatinine, Ser: 1.5 mg/dL — ABNORMAL HIGH (ref 0.61–1.24)
Glucose, Bld: 89 mg/dL (ref 65–99)
HCT: 37 % — ABNORMAL LOW (ref 39.0–52.0)
Hemoglobin: 12.6 g/dL — ABNORMAL LOW (ref 13.0–17.0)
Potassium: 4 mmol/L (ref 3.5–5.1)
SODIUM: 141 mmol/L (ref 135–145)
TCO2: 29 mmol/L (ref 0–100)

## 2016-04-09 LAB — URINALYSIS, ROUTINE W REFLEX MICROSCOPIC
BILIRUBIN URINE: NEGATIVE
Glucose, UA: NEGATIVE mg/dL
HGB URINE DIPSTICK: NEGATIVE
KETONES UR: NEGATIVE mg/dL
LEUKOCYTES UA: NEGATIVE
Nitrite: NEGATIVE
Protein, ur: 30 mg/dL — AB
Specific Gravity, Urine: 1.024 (ref 1.005–1.030)
pH: 5 (ref 5.0–8.0)

## 2016-04-09 MED ORDER — MORPHINE SULFATE (PF) 4 MG/ML IV SOLN
6.0000 mg | Freq: Once | INTRAVENOUS | Status: AC
  Administered 2016-04-09: 6 mg via INTRAVENOUS
  Filled 2016-04-09: qty 2

## 2016-04-09 MED ORDER — OXYCODONE HCL 5 MG PO TABS: 5.0000 mg | ORAL_TABLET | Freq: Two times a day (BID) | ORAL | 0 refills | Status: DC | PRN

## 2016-04-09 MED ORDER — IOPAMIDOL (ISOVUE-300) INJECTION 61%
INTRAVENOUS | Status: AC
  Administered 2016-04-09: 100 mL
  Filled 2016-04-09: qty 100

## 2016-04-09 NOTE — ED Provider Notes (Signed)
[facility]-EMERGENCY DEPT Provider Note   CSN: [id number] Arrival date & time: 04/08/16  2311   By signing my name below, I, Octavia Heir, attest that this documentation has been prepared under the direction and in the presence of Tomasita Crumble, MD.  Electronically Signed: Octavia Heir, ED Scribe. 04/09/16. 1:17 AM.   History   Chief Complaint Chief Complaint  Patient presents with  . Groin Pain   The history is provided by the patient. No language interpreter was used.   HPI Comments: James Stevens is a 56 y.o. male who has a PMhx of chronic back pain, GERD, occulusion of artery, schizophrenia and PVD presents to the Emergency Department complaining of acute onset, moderate, left inguinal pain that began ~ 2 hours ago. He states he was rubbing his back when he felt a sudden knot in his left inguinal area. Pt has not had similar symptoms in the past. He states he recently had a posterior lumbar interbody fusion, interbody prosthesis, posterior lateral arthrodesis, posterior segmental instrumentation of L4/L5/S1 performed by Dr. Tressie Stalker on 03/23/16. He denies vomiting, diarrhea, difficulty urinating, and bladder or bowel incontinence.  Past Medical History:  Diagnosis Date  . Arthritis   . Bipolar affective (HCC)   . Chronic back pain    and right hip  . Depression   . GERD (gastroesophageal reflux disease)    occ tums  . Headache(784.0)    migraines  . Hip pain   . 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 Peripheral vascular disease (HCC)   . Schizophrenia [facility])     Patient Active Problem List   Diagnosis Date Noted  . Lumbar herniated disc 03/23/2016  . 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 HNP (herniated nucleus pulposus), lumbar 05/05/2014    Class: Chronic  . PVD (peripheral vascular disease) (HCC) 10/30/2013  . Aortoiliac occlusive disease  (HCC) 10/07/2013  . Atherosclerosis of native arteries of the extremities with intermittent claudication 09/05/2013  . Peripheral vascular disease, unspecified 08/29/2013  . Avascular necrosis of hip (HCC) 04/17/2012    Past Surgical History:  Procedure Laterality Date  . AORTA - BILATERAL FEMORAL ARTERY BYPASS GRAFT N/A 10/07/2013   Procedure: AORTA BIFEMORAL BYPASS GRAFT;  Surgeon: Sherren Kerns, MD;  Location: [facility] OR;  Service: Vascular;  Laterality: N/A;  . ESOPHAGEAL DILATION    . ESOPHAGOGASTRODUODENOSCOPY  09/06/2011   Procedure: ESOPHAGOGASTRODUODENOSCOPY (EGD);  Surgeon: Vertell Novak., MD;  Location: [facility];  Service: Endoscopy;  Laterality: N/A;  . FRACTURE SURGERY     left fingers # 4,5  . JOINT REPLACEMENT Bilateral    rt hip replacement, and  LT hip  . LUMBAR LAMINECTOMY N/A 05/05/2014   Procedure: BILATERAL LUMBAR LAMINECTOMY L4-5 AND L5-S1;  Surgeon: Kerrin Champagne, MD;  Location: [facility] OR;  Service: Orthopedics;  Laterality: N/A;  . MANDIBLE FRACTURE SURGERY    . SAVORY DILATION  09/06/2011   Procedure: SAVORY DILATION;  Surgeon: Vertell Novak., MD;  Location: [facility];  Service: Endoscopy;  Laterality: N/A;  . TOTAL HIP ARTHROPLASTY Left 04/17/2012   Procedure: LEFT TOTAL HIP ARTHROPLASTY ANTERIOR APPROACH;  Surgeon: Kathryne Hitch, MD;  Location: [facility] OR;  Service: Orthopedics;  Laterality: Left;       Home Medications    Prior to Admission medications   Medication Sig Start Date End Date Taking? Authorizing Provider  cyclobenzaprine (  FLEXERIL) 10 MG tablet Take 1 tablet (10 mg total) by mouth 3 (three) times daily as needed for muscle spasms. 03/24/16  Yes Tressie Stalker, MD  oxyCODONE-acetaminophen (PERCOCET) 10-325 MG tablet Take 1 tablet by mouth every 4 (four) hours as needed for pain. 03/24/16  Yes Tressie Stalker, MD  docusate sodium (COLACE) 100 MG capsule Take 1 capsule (100 mg total) by mouth 2 (two) times daily. Patient not taking:  Reported on 04/09/2016 03/24/16   Tressie Stalker, MD    Family History Family History  Problem Relation Age of Onset  . Diabetes Mother   . Diabetes Father   . Diabetes Other   . Diabetes Brother     Social History Social History  Substance Use Topics  . Smoking status: Current Every Day Smoker    Packs/day: 0.20    Years: 34.00    Types: Cigarettes  . Smokeless tobacco: Never Used  . Alcohol use Yes     Comment: once a week      Allergies   Ibuprofen and Tramadol   Review of Systems Review of Systems  A complete 10 system review of systems was obtained and all systems are negative except as noted in the HPI and PMH.   Physical Exam Updated Vital Signs BP 117/80   Pulse (!) 107   Temp 97.6 F (36.4 C) (Oral)   Resp 16   Ht  (1.778 m)   Wt 143 lb (64.9 kg)   SpO2 99%   BMI 20.52 kg/m   Physical Exam  Constitutional: He is oriented to person, place, and time. Vital signs are normal. He appears well-developed and well-nourished.  Non-toxic appearance. He does not appear ill. No distress.  HENT:  Head: Normocephalic and atraumatic.  Nose: Nose normal.  Mouth/Throat: Oropharynx is clear and moist. No oropharyngeal exudate.  Eyes: Conjunctivae and EOM are normal. Pupils are equal, round, and reactive to light. No scleral icterus.  Neck: Normal range of motion. Neck supple. No tracheal deviation, no edema, no erythema and normal range of motion present. No thyroid mass and no thyromegaly present.  Cardiovascular: Normal rate, regular rhythm, S1 normal, S2 normal, normal heart sounds, intact distal pulses and normal pulses.  Exam reveals no gallop and no friction rub.   No murmur heard. Pulmonary/Chest: Effort normal and breath sounds normal. No respiratory distress. He has no wheezes. He has no rhonchi. He has no rales.  Abdominal: Soft. Normal appearance and bowel sounds are normal. He exhibits no distension, no ascites and no mass. There is no  hepatosplenomegaly. There is no tenderness. There is no rebound, no guarding and no CVA tenderness.  Genitourinary:  Genitourinary Comments: 2 left inguinal lymphnodes that are TTP, both are mobile and 1 cm each  Musculoskeletal: Normal range of motion. He exhibits no edema or tenderness.  Lymphadenopathy:    He has no cervical adenopathy.  Neurological: He is alert and oriented to person, place, and time. He has normal strength. No cranial nerve deficit or sensory deficit.  Skin: Skin is warm, dry and intact. No petechiae and no rash noted. He is not diaphoretic. No erythema. No pallor.  Nursing note and vitals reviewed.    ED Treatments / Results  DIAGNOSTIC STUDIES: Oxygen Saturation is 99% on RA, normal by my interpretation.  COORDINATION OF CARE:  12:43 AM Discussed treatment plan with pt at bedside and pt agreed to plan.  Labs (all labs ordered are listed, but only abnormal results are displayed) Labs Reviewed  URINALYSIS, ROUTINE W REFLEX MICROSCOPIC - Abnormal; Notable for the following:       Result Value   APPearance HAZY (*)    Protein, ur 30 (*)    Bacteria, UA RARE (*)    Squamous Epithelial / LPF 0-5 (*)    All other components within normal limits  CBC WITH DIFFERENTIAL/PLATELET - Abnormal; Notable for the following:    WBC 12.7 (*)    RBC 3.73 (*)    Hemoglobin 12.1 (*)    HCT 36.1 (*)    Neutro Abs 9.2 (*)    All other components within normal limits  I-STAT CHEM 8, ED - Abnormal; Notable for the following:    Creatinine, Ser 1.50 (*)    Hemoglobin 12.6 (*)    HCT 37.0 (*)    All other components within normal limits    EKG  EKG Interpretation None       Radiology Ct Pelvis W Contrast  Result Date: 04/09/2016 CLINICAL DATA:  Left groin pain for 1 hour. Left inguinal pain with 2 tender masses, possible lymph nodes on exam. EXAM: CT PELVIS WITH CONTRAST TECHNIQUE: Multidetector CT imaging of the pelvis was performed using the standard protocol  following the bolus administration of intravenous contrast. CONTRAST:  ISOVUE-300 IOPAMIDOL (ISOVUE-300) INJECTION 61% COMPARISON:  Noncontrast CT 11/04/2015 FINDINGS: Urinary Tract: Urinary bladder obscured by streak artifact from bilateral hip prostheses. Bowel: Visualized small and large bowel loops appear normal, streak artifact limits assessment. Vascular/Lymphatic: Aorto bi-iliac bypass graft. Inguinal/external iliac portions obscured by streak artifact from bilateral hip prostheses. Reproductive:  Prostate gland obscured by streak artifact. Other: Significant streak artifact from bilateral hip arthroplasties limits assessment of the inguinal regions. No bulky lymphadenopathy. Musculoskeletal: Bilateral hip arthroplasties appear intact. Posterior fusion in the lumbosacral spine is partially included. Benign-appearing sclerotic lesion in the right iliac bone, small peripherally sclerotic left sacral lesion, nonaggressive. IMPRESSION: Area of clinical concern in the left inguinal region is significantly obscured by streak artifact from bilateral hip prostheses. No bulky adenopathy is visualized, however regional visualization is limited. Ultrasound may be of value for further evaluation. Electronically Signed   By: Rubye Oaks M.D.   On: 04/09/2016 02:45    Procedures Procedures (including critical care time)  Medications Ordered in ED Medications  morphine 4 MG/ML injection 6 mg (6 mg Intravenous Given 04/09/16 0132)  iopamidol (ISOVUE-300) 61 % injection (100 mLs  Contrast Given 04/09/16 0218)     Initial Impression / Assessment and Plan / ED Course  I have reviewed the triage vital signs and the nursing notes.  Pertinent labs & imaging results that were available during my care of the patient were reviewed by me and considered in my medical decision making (see chart for details).     Patient presents to the ED for groin pain.  He has tender masses that may be lymphadenopathy, but  he states he has had instrumentation in the area as well. Plan to obtain CT for further evaluation.  He was given morphine for pain control.  3:18 AM CT does not reveal the area of pain due to artifact.  Rec fu ultrasound. Patient advised to see PCP for this.  No large masses seen on CT.  He appears well and in NAD.  He agrees with the plan and will call on Monday.  VS remain within his normal limits and he is safe for DC.  Final Clinical Impressions(s) / ED Diagnoses   Final diagnoses:  Lymphadenopathy  I personally performed the services described in this documentation, which was scribed in my presence. The recorded information has been reviewed and is accurate.    New Prescriptions New Prescriptions   No medications on file     Tomasita Crumble, MD 04/09/16 [phone]

## 2016-08-01 ENCOUNTER — Encounter: Payer: Self-pay | Admitting: Family

## 2016-08-11 ENCOUNTER — Inpatient Hospital Stay (HOSPITAL_COMMUNITY): Admission: RE | Admit: 2016-08-11 | Payer: Medicaid Other | Source: Ambulatory Visit

## 2016-08-11 ENCOUNTER — Ambulatory Visit: Payer: Medicaid Other | Admitting: Family

## 2016-09-02 ENCOUNTER — Encounter: Payer: Self-pay | Admitting: Surgery

## 2016-09-05 ENCOUNTER — Ambulatory Visit (INDEPENDENT_AMBULATORY_CARE_PROVIDER_SITE_OTHER): Payer: Medicaid Other | Admitting: Family

## 2016-09-05 ENCOUNTER — Ambulatory Visit (HOSPITAL_COMMUNITY)
Admission: RE | Admit: 2016-09-05 | Discharge: 2016-09-05 | Disposition: A | Discharge status: Home / Self Care | Payer: Medicaid Other | Source: Ambulatory Visit | Attending: Family | Admitting: Family

## 2016-09-05 ENCOUNTER — Encounter: Payer: Self-pay | Admitting: Family

## 2016-09-05 VITALS — BP 115/77 | HR 69 | Temp 97.2°F | Resp 16 | Ht 70.0 in | Wt 143.0 lb

## 2016-09-05 DIAGNOSIS — I7409 Other arterial embolism and thrombosis of abdominal aorta: Secondary | ICD-10-CM | POA: Insufficient documentation

## 2016-09-05 DIAGNOSIS — Z95828 Presence of other vascular implants and grafts: Secondary | ICD-10-CM | POA: Diagnosis present

## 2016-09-05 DIAGNOSIS — F17200 Nicotine dependence, unspecified, uncomplicated: Secondary | ICD-10-CM | POA: Diagnosis not present

## 2016-09-05 NOTE — Patient Instructions (Signed)
Steps to Quit Smoking Smoking tobacco can be bad for your health. It can also affect almost every organ in your body. Smoking puts you and people around you at risk for many serious long-lasting (chronic) diseases. Quitting smoking is hard, but it is one of the best things that you can do for your health. It is never too late to quit. What are the benefits of quitting smoking? When you quit smoking, you lower your risk for getting serious diseases and conditions. They can include:  Lung cancer or lung disease.  Heart disease.  Stroke.  Heart attack.  Not being able to have children (infertility).  Weak bones (osteoporosis) and broken bones (fractures).  If you have coughing, wheezing, and shortness of breath, those symptoms may get better when you quit. You may also get sick less often. If you are pregnant, quitting smoking can help to lower your chances of having a baby of low birth weight. What can I do to help me quit smoking? Talk with your doctor about what can help you quit smoking. Some things you can do (strategies) include:  Quitting smoking totally, instead of slowly cutting back how much you smoke over a period of time.  Going to in-person counseling. You are more likely to quit if you go to many counseling sessions.  Using resources and support systems, such as: ? Online chats with a counselor. ? Phone quitlines. ? Printed self-help materials. ? Support groups or group counseling. ? Text messaging programs. ? Mobile phone apps or applications.  Taking medicines. Some of these medicines may have nicotine in them. If you are pregnant or breastfeeding, do not take any medicines to quit smoking unless your doctor says it is okay. Talk with your doctor about counseling or other things that can help you.  Talk with your doctor about using more than one strategy at the same time, such as taking medicines while you are also going to in-person counseling. This can help make  quitting easier. What things can I do to make it easier to quit? Quitting smoking might feel very hard at first, but there is a lot that you can do to make it easier. Take these steps:  Talk to your family and friends. Ask them to support and encourage you.  Call phone quitlines, reach out to support groups, or work with a counselor.  Ask people who smoke to not smoke around you.  Avoid places that make you want (trigger) to smoke, such as: ? Bars. ? Parties. ? Smoke-break areas at work.  Spend time with people who do not smoke.  Lower the stress in your life. Stress can make you want to smoke. Try these things to help your stress: ? Getting regular exercise. ? Deep-breathing exercises. ? Yoga. ? Meditating. ? Doing a body scan. To do this, close your eyes, focus on one area of your body at a time from head to toe, and notice which parts of your body are tense. Try to relax the muscles in those areas.  Download or buy apps on your mobile phone or tablet that can help you stick to your quit plan. There are many free apps, such as QuitGuide from the CDC (Centers for Disease Control and Prevention). You can find more support from smokefree.gov and other websites.  This information is not intended to replace advice given to you by your health care provider. Make sure you discuss any questions you have with your health care provider. Document Released: 10/23/2008 Document   Revised: 08/25/2015 Document Reviewed: 05/13/2014 Elsevier Interactive Patient Education  2018 Elsevier Inc.     Peripheral Vascular Disease Peripheral vascular disease (PVD) is a disease of the blood vessels that are not part of your heart and brain. A simple term for PVD is poor circulation. In most cases, PVD narrows the blood vessels that carry blood from your heart to the rest of your body. This can result in a decreased supply of blood to your arms, legs, and internal organs, like your stomach or kidneys.  However, it most often affects a person's lower legs and feet. There are two types of PVD.  Organic PVD. This is the more common type. It is caused by damage to the structure of blood vessels.  Functional PVD. This is caused by conditions that make blood vessels contract and tighten (spasm).  Without treatment, PVD tends to get worse over time. PVD can also lead to acute ischemic limb. This is when an arm or limb suddenly has trouble getting enough blood. This is a medical emergency. Follow these instructions at home:  Take medicines only as told by your doctor.  Do not use any tobacco products, including cigarettes, chewing tobacco, or electronic cigarettes. If you need help quitting, ask your doctor.  Lose weight if you are overweight, and maintain a healthy weight as told by your doctor.  Eat a diet that is low in fat and cholesterol. If you need help, ask your doctor.  Exercise regularly. Ask your doctor for some good activities for you.  Take good care of your feet. ? Wear comfortable shoes that fit well. ? Check your feet often for any cuts or sores. Contact a doctor if:  You have cramps in your legs while walking.  You have leg pain when you are at rest.  You have coldness in a leg or foot.  Your skin changes.  You are unable to get or have an erection (erectile dysfunction).  You have cuts or sores on your feet that are not healing. Get help right away if:  Your arm or leg turns cold and blue.  Your arms or legs become red, warm, swollen, painful, or numb.  You have chest pain or trouble breathing.  You suddenly have weakness in your face, arm, or leg.  You become very confused or you cannot speak.  You suddenly have a very bad headache.  You suddenly cannot see. This information is not intended to replace advice given to you by your health care provider. Make sure you discuss any questions you have with your health care provider. Document Released:  03/23/2009 Document Revised: 06/04/2015 Document Reviewed: 06/06/2013 Elsevier Interactive Patient Education  2017 Elsevier Inc.  

## 2016-09-05 NOTE — Progress Notes (Signed)
[facility] [place]   CC: Follow up peripheral artery occlusive disease  History of Present Illness James Stevens is a 56 y.o. male who is s/p aortobifemoral bypass grafting on 10/07/13 by Dr. Darrick Penna for aortoiliac occlusive disease. He returns today for follow up. He denies any claudication symptoms. He continues to smoke.  The pt is on a statin for his cholesterol.He is on a beta blocker for his HTN.he also takes aspirin daily. He states one leg became shorter than the other after his bilateral hip replacements.  In March 2018 he had L4-S1 surgery. He also has had lumbar spine surgery in the past. He states pain relief in his low back since the above surgery. He reports pain in lateral aspects of both hips and right calf after walking about 100 feet, if he walks further then his left calf will hurt, seems to be relieved by rest.   He denies non healing wounds in his feet/legs.  Pt Diabetic: No Pt smoker: smoker  (1/3 ppd, started at age 72 yrs)  Pt meds include: Statin :No, pt states his cholesterol was "alright" Betablocker: No ASA: Yes Other anticoagulants/antiplatelets: no     Past Medical History:  Diagnosis Date  . Arthritis   . Bipolar affective (HCC)   . Chronic back pain    and right hip  . Depression   . GERD (gastroesophageal reflux disease)    occ tums  . Headache(784.0)    migraines  . Hip pain   . 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 Peripheral vascular disease (HCC)   . Schizophrenia [facility])     Social History Social History  Substance Use Topics  . Smoking status: Current Every Day Smoker    Packs/day: 0.20    Years: 34.00    Types: Cigarettes  . Smokeless tobacco: Never Used     Comment: 3-4 cigarettes per day  . Alcohol use Yes     Comment: once a week     Family History Family History  Problem Relation Age of Onset  . Diabetes Mother   .  Diabetes Father   . Diabetes Other   . Diabetes Brother     Past Surgical History:  Procedure Laterality Date  . AORTA - BILATERAL FEMORAL ARTERY BYPASS GRAFT N/A 10/07/2013   Procedure: AORTA BIFEMORAL BYPASS GRAFT;  Surgeon: Sherren Kerns, MD;  Location: [facility] OR;  Service: Vascular;  Laterality: N/A;  . ESOPHAGEAL DILATION    . ESOPHAGOGASTRODUODENOSCOPY  09/06/2011   Procedure: ESOPHAGOGASTRODUODENOSCOPY (EGD);  Surgeon: Vertell Novak., MD;  Location: [facility];  Service: Endoscopy;  Laterality: N/A;  . FRACTURE SURGERY     left fingers # 4,5  . JOINT REPLACEMENT Bilateral    rt hip replacement, and  LT hip  . LUMBAR LAMINECTOMY N/A 05/05/2014   Procedure: BILATERAL LUMBAR LAMINECTOMY L4-5 AND L5-S1;  Surgeon: Kerrin Champagne, MD;  Location: [facility] OR;  Service: Orthopedics;  Laterality: N/A;  . MANDIBLE FRACTURE SURGERY    . SAVORY DILATION  09/06/2011   Procedure: SAVORY DILATION;  Surgeon: Vertell Novak., MD;  Location: [facility];  Service: Endoscopy;  Laterality: N/A;  . TOTAL HIP ARTHROPLASTY Left 04/17/2012   Procedure: LEFT TOTAL HIP ARTHROPLASTY ANTERIOR APPROACH;  Surgeon: Kathryne Hitch, MD;  Location: [facility] OR;  Service: Orthopedics;  Laterality: Left;    Allergies  Allergen Reactions  . Ibuprofen Other (See Comments)  Gastric irritation  . Tramadol Itching    Current Outpatient Prescriptions  Medication Sig Dispense Refill  . HYDROcodone-acetaminophen (NORCO/VICODIN) 5-325 MG tablet Take 1 tablet by mouth every 6 (six) hours as needed for moderate pain.     No current facility-administered medications for this visit.    Facility-Administered Medications Ordered in Other Visits  Medication Dose Route Frequency Provider Last Rate Last Dose  . lactated ringers infusion    Continuous PRN Nils Pyle, CRNA      . lactated ringers infusion    Continuous PRN Nils Pyle, CRNA        ROS: See HPI for pertinent positives and negatives.   Physical  Examination  Vitals:   09/05/16 0913 09/05/16 0918  BP: 112/74 115/77  Pulse: 69   Resp: 16   Temp: (!) 97.2 F (36.2 C)   TempSrc: Oral   SpO2: 96%   Weight: 143 lb (64.9 kg)   Height: 5\' 10"  (1.778 m)    Body mass index is 20.52 kg/m.  General: A&O x 3, male. Gait: seated in his motorized w/c Eyes: PERRLA. Pulmonary: CTAB, without wheezes, rales, or rhonchi. Cardiac: regular rhythm and rate, no detected murmur.         Carotid Bruits Right Left   Negative Negative   Abdominal aortic pulse is not palpable. Radial pulses: 2+ palpable and =                           VASCULAR EXAM: Extremities without ischemic changes, without Gangrene; without open wounds.                                                                                                                                                       LE Pulses Right Left       FEMORAL  2+ palpable  2+ palpable        POPLITEAL  not palpable   not palpable       POSTERIOR TIBIAL  not palpable    1+palpable        DORSALIS PEDIS      ANTERIOR TIBIAL  not palpable  2+palpable    Abdomen: soft, NT, no palpable masses. Skin: no rashes, no ulcers. Musculoskeletal: no muscle wasting or atrophy.         Neurologic: A&O X 3; Appropriate Affect ; SENSATION: normal; MOTOR FUNCTION:  moving all extremities equally, motor strength 5/5 throughout. Speech is fluent/normal. CN 2-12 intact.     ASSESSMENT: James Stevens is a 56 y.o. male who is s/p aortobifemoral bypass grafting on 10/07/13. The pain that he has in his hips with walking is not due to lack of arterial perfusion, his bilateral femoral pulses are 2+ palpable.   His primary atherosclerotic risk factor remains  active smoking since age 58. Fortunately he does not have DM, he has a normal BMI, and walks a good part of his day.    DATA  ABI (Date: 09/05/2016)  R:   ABI: 0.63 (was 0.96 on 08-06-15),   PT: not documented (was bi)  DP: not  documented (was bi)  TBI:  0.32  L:   ABI: 0.84 (was 0.93),   PT: not documented (was tri)  DP: not documented (was bi)  TBI: 0.65 His right ABI declined from 0.96 to 0.63; left declined from 0.93 to 0.84. Waveform morphology not documented.     PLAN:  Graduated walking program discussed and how to achieve.  The patient was counseled re smoking cessation and given several free resources re smoking cessation. Follow up in 6 months with ABI's. I advised him to notify us if he develops non healing wounds in his feet or legs, or the pain in his legs gets worse with walking.   I discussed in depth with the patient the nature of atherosclerosis, and emphasized the importance of maximal medical management including strict control of blood pressure, blood glucose, and lipid levels, obtaining regular exercise, and cessation of smoking.  The patient is aware that without maximal medical management the underlying atherosclerotic disease process will progress, limiting the benefit of any interventions.  The patient was given information about PAD including signs, symptoms, treatment, what symptoms should prompt the patient to seek immediate medical care, and risk reduction measures to take.  Charisse March, RN, MSN, FNP-C [facility] Phone: [phone]  Clinic MD: Myra Gianotti  09/05/16 9:21 AM

## 2016-09-09 ENCOUNTER — Ambulatory Visit: Payer: Medicaid Other | Admitting: Family

## 2016-09-09 ENCOUNTER — Encounter (HOSPITAL_COMMUNITY): Payer: Medicaid Other

## 2016-09-14 NOTE — Addendum Note (Signed)
Addended by: Burton ApleyPETTY, Rayder Sullenger A on: 09/14/2016 09:24 AM   Modules accepted: Orders

## 2017-02-16 ENCOUNTER — Other Ambulatory Visit: Payer: Self-pay

## 2017-03-05 ENCOUNTER — Emergency Department (HOSPITAL_COMMUNITY)
Admission: EM | Admit: 2017-03-05 | Discharge: 2017-03-05 | Disposition: A | Discharge status: Home / Self Care | Payer: Medicaid Other | Attending: Emergency Medicine | Admitting: Emergency Medicine

## 2017-03-05 ENCOUNTER — Encounter (HOSPITAL_COMMUNITY): Payer: Self-pay

## 2017-03-05 ENCOUNTER — Emergency Department (HOSPITAL_COMMUNITY): Payer: Medicaid Other

## 2017-03-05 DIAGNOSIS — Z96642 Presence of left artificial hip joint: Secondary | ICD-10-CM | POA: Insufficient documentation

## 2017-03-05 DIAGNOSIS — M25551 Pain in right hip: Secondary | ICD-10-CM | POA: Diagnosis not present

## 2017-03-05 DIAGNOSIS — W19XXXA Unspecified fall, initial encounter: Secondary | ICD-10-CM

## 2017-03-05 DIAGNOSIS — F17210 Nicotine dependence, cigarettes, uncomplicated: Secondary | ICD-10-CM | POA: Insufficient documentation

## 2017-03-05 DIAGNOSIS — M25552 Pain in left hip: Secondary | ICD-10-CM | POA: Insufficient documentation

## 2017-03-05 DIAGNOSIS — M545 Low back pain: Secondary | ICD-10-CM | POA: Diagnosis present

## 2017-03-05 MED ORDER — OXYCODONE-ACETAMINOPHEN 5-325 MG PO TABS: 1.0000 | ORAL_TABLET | Freq: Four times a day (QID) | ORAL | 0 refills | Status: AC | PRN

## 2017-03-05 MED ORDER — OXYCODONE-ACETAMINOPHEN 5-325 MG PO TABS
1.0000 | ORAL_TABLET | Freq: Once | ORAL | Status: AC
  Administered 2017-03-05: 1 via ORAL
  Filled 2017-03-05: qty 1

## 2017-03-05 NOTE — Discharge Instructions (Signed)
X-rays of your low back and pelvis, hips are stable and without new bony injuries.  I have given you Percocet for a day to help you with pain until you're seen by your primary care doctor.  Follow up with vascular surgery as soon as possible for further discussion of pain in your legs with walking.   Percocet is a narcotic pain medication that has risk of overdose, death, dependence and abuse. Mild and expected side effects include nausea, stomach upset, drowsiness, constipation. Do not consume alcohol, drive or use heavy machinery while taking this medication. Do not leave unattended around children. Flush any remaining pills that you do not use and do not share.  The emergency department has a strict policy regarding prescription of narcotic medications. We prescribe a short course for acute, new pain or injuries. We are unable to refill this medication in the emergency department for chronic pain or repeatedly.  Refill need to be done by specialist or primary care provider or pain clinic.  Contact your primary care provider or specialist for chronic pain management and refill on narcotic medications.

## 2017-03-05 NOTE — ED Triage Notes (Signed)
Pt presents for evaluation of leg/hip pain after fall on Friday. Pt reports he slipped out of car, wheelchair bound, vascular patient. Pt reports intermittent numbness in bilateral lower extremities since fall. Pt reports he has poor circulation and that is why hes wheelchair bound. AxO x4.

## 2017-03-05 NOTE — ED Provider Notes (Signed)
[facility] EMERGENCY DEPARTMENT Provider Note   CSN: [id number] Arrival date & time: 03/05/17  1209     History   Chief Complaint Chief Complaint  Patient presents with  . Fall    HPI James Stevens is a 57 y.o. male w/ h/o chronic back pain and hip pain is here for evaluation of pain to low back and bilateral hips (R>L) x 2 days since mechanical fall. States he was getting out of car when he slipped on wet grass and fell landing on his right hip, he got up and fell again landing on his left hip. Has multiple allergies to OTC pain medications and has not tried anything for pain. Had a short prescription of oxycodone by PCP on 2/12 that he has since ran out of. Tried to call PCP Friday but clinic closed. He has f/u with PCP next week. Reports numbness and pain to lower extremities from calves to thighs x 2 years that he attributes with bad circulation problem. Reports walking more than 25 yards causes him burning pain to bilateral calves, if he walks any farther it spread to his thighs and his legs become numb. This is chronic and unchanged. Per last vascular surgery note on 08/2016, pt denied ischimic symptoms then, when I bring this up pt states that is not true and he has had leg pain like this for over 2 years. He has been using a wheelchair to walk because of this pain for 1-2 years. Has h/o aortoiliac disease s/p bypass surgery and is followed by vascular surgery, he has f/u with them next week as well. Denies fevers, chills, saddle anesthesia, bladder or bowel incontinence. States his PCP has tried to set him up with pain clinic. Denies head trauma during fall. No anticoagulant use.  HPI  Past Medical History:  Diagnosis Date  . Arthritis   . Bipolar affective (HCC)   . Chronic back pain    and right hip  . Depression   . GERD (gastroesophageal reflux disease)    occ tums  . Headache(784.0)    migraines  . Hip pain   . 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 Peripheral vascular disease (HCC)   . Schizophrenia [facility])     Patient Active Problem List   Diagnosis Date Noted  . Lumbar herniated disc 03/23/2016  . 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 HNP (herniated nucleus pulposus), lumbar 05/05/2014    Class: Chronic  . PVD (peripheral vascular disease) (HCC) 10/30/2013  . Aortoiliac occlusive disease (HCC) 10/07/2013  . Atherosclerosis of native arteries of the extremities with intermittent claudication 09/05/2013  . Peripheral vascular disease, unspecified (HCC) 08/29/2013  . Avascular necrosis of hip (HCC) 04/17/2012    Past Surgical History:  Procedure Laterality Date  . AORTA - BILATERAL FEMORAL ARTERY BYPASS GRAFT N/A 10/07/2013   Procedure: AORTA BIFEMORAL BYPASS GRAFT;  Surgeon: Sherren Kerns, MD;  Location: [facility] OR;  Service: Vascular;  Laterality: N/A;  . ESOPHAGEAL DILATION    . ESOPHAGOGASTRODUODENOSCOPY  09/06/2011   Procedure: ESOPHAGOGASTRODUODENOSCOPY (EGD);  Surgeon: Vertell Novak., MD;  Location: [facility];  Service: Endoscopy;  Laterality: N/A;  . FRACTURE SURGERY     left fingers # 4,5  . JOINT REPLACEMENT Bilateral    rt hip replacement, and  LT hip  . LUMBAR LAMINECTOMY N/A 05/05/2014   Procedure: BILATERAL LUMBAR  LAMINECTOMY L4-5 AND L5-S1;  Surgeon: Kerrin Champagne, MD;  Location: [facility] OR;  Service: Orthopedics;  Laterality: N/A;  . MANDIBLE FRACTURE SURGERY    . SAVORY DILATION  09/06/2011   Procedure: SAVORY DILATION;  Surgeon: Vertell Novak., MD;  Location: [facility];  Service: Endoscopy;  Laterality: N/A;  . TOTAL HIP ARTHROPLASTY Left 04/17/2012   Procedure: LEFT TOTAL HIP ARTHROPLASTY ANTERIOR APPROACH;  Surgeon: Kathryne Hitch, MD;  Location: [facility] OR;  Service: Orthopedics;  Laterality: Left;       Home Medications    Prior to Admission medications   Medication Sig  Start Date End Date Taking? Authorizing Provider  HYDROcodone-acetaminophen (NORCO/VICODIN) 5-325 MG tablet Take 1 tablet by mouth every 6 (six) hours as needed for moderate pain.    [provider]  oxyCODONE-acetaminophen (PERCOCET/ROXICET) 5-325 MG tablet Take 1 tablet by mouth every 6 (six) hours as needed for up to 1 day for severe pain. 03/05/17 03/06/17  Liberty Handy, PA-C    Family History Family History  Problem Relation Age of Onset  . Diabetes Mother   . Diabetes Father   . Diabetes Other   . Diabetes Brother     Social History Social History   Tobacco Use  . Smoking status: Current Every Day Smoker    Packs/day: 0.20    Years: 34.00    Pack years: 6.80    Types: Cigarettes  . Smokeless tobacco: Never Used  . Tobacco comment: 3-4 cigarettes per day  Substance Use Topics  . Alcohol use: Yes    Comment: once a week   . Drug use: Yes    Frequency: 2.0 times per week    Types: Cocaine, Marijuana     Allergies   Ibuprofen and Tramadol   Review of Systems Review of Systems  Musculoskeletal: Positive for arthralgias, back pain and gait problem.  All other systems reviewed and are negative.    Physical Exam Updated Vital Signs BP (!) 152/89   Pulse 78   Temp 97.9 F (36.6 C) (Oral)   Resp 16   SpO2 99%   Physical Exam  Constitutional: He appears well-developed and well-nourished. No distress.  HENT:  Head: Normocephalic and atraumatic.  Nose: Nose normal.  Eyes: EOM are normal.  Neck:  No midline cervical spine tenderness  Cardiovascular: Normal rate, S1 normal, S2 normal and normal heart sounds.  Pulses:      Radial pulses are 2+ on the right side, and 2+ on the left side.       Dorsalis pedis pulses are 2+ on the right side, and 2+ on the left side.  2+ DP, PT and popliteal pulses bilaterally. LE warm with good capillary refill.   Pulmonary/Chest: Effort normal and breath sounds normal. He has no decreased breath sounds. He exhibits  no tenderness.  Abdominal: Soft. Normal appearance and bowel sounds are normal. There is no tenderness.  No suprapubic or CVA tenderness   Musculoskeletal: He exhibits tenderness.       Lumbar back: He exhibits tenderness and pain.  Diffuse lumbar tenderness midline and paraspinal musculature, no step offs, deformity, ecchymosis. No obvious increased tone.  Bilateral SI joints and sciatic notches tenderness  PROM of hip intact with pain with IR and ER. No leg shortening or rotation. Pelvis stable. Negative SLR.   Neurological:  5/5 strength with flexion/extension of hip, knee and ankle, bilaterally.  Sensation to light touch intact in lower extremities including feet Ankle and knee DTRs  intact bilaterally. Plantar response intact bilaterally.   Skin: Skin is warm and dry. Capillary refill takes less than 2 seconds.  No ecchymosis, abrasions or signs of trauma to skin over her buttocks or low back.  Psychiatric: He has a normal mood and affect. His behavior is normal. Judgment and thought content normal.     ED Treatments / Results  Labs (all labs ordered are listed, but only abnormal results are displayed) Labs Reviewed - No data to display  EKG  EKG Interpretation None       Radiology Dg Lumbar Spine Complete  Result Date: 03/05/2017 CLINICAL DATA:  Fall 2 days ago.  Pain. EXAM: LUMBAR SPINE - COMPLETE 4+ VIEW COMPARISON:  November 18, 2016 FINDINGS: Pedicle rods and screws are seen at L3, L4, L5. Disc spacer devices are seen at the same levels. No evidence of hardware failure or malpositioning. Mild curvature lumbar spine, apex to the right, is stable. No inter pedicular widening. No pars defects are identified. No other malalignment. No fractures. Mild multilevel degenerative changes. IMPRESSION: No acute abnormalities. Degenerative changes. Surgical hardware in the lumbar spine is stable. Electronically Signed   By: Gerome Sam III M.D   On: 03/05/2017 15:13   Dg Hips Bilat  W Or Wo Pelvis 3-4 Views  Result Date: 03/05/2017 CLINICAL DATA:  Two recent falls with bilateral hip pain. EXAM: DG HIP (WITH OR WITHOUT PELVIS) 3-4V BILAT COMPARISON:  None. FINDINGS: Pedicle rods, screws, and disc spacer devices are seen in the lower lumbar spine. Visualized portions of the sacrum and iliac bones are normal. The patient is status post bilateral hip replacements. Acetabular and femoral components are in good position. No evidence of hardware failure or loosening. No acute fractures or dislocations. IMPRESSION: Bilateral hip replacements.  No acute fractures. Electronically Signed   By: Gerome Sam III M.D   On: 03/05/2017 15:03    Procedures Procedures (including critical care time)  Medications Ordered in ED Medications  oxyCODONE-acetaminophen (PERCOCET/ROXICET) 5-325 MG per tablet 1 tablet (1 tablet Oral Given 03/05/17 1507)     Initial Impression / Assessment and Plan / ED Course  I have reviewed the triage vital signs and the nursing notes.  Pertinent labs & imaging results that were available during my care of the patient were reviewed by me and considered in my medical decision making (see chart for details).    57 year old male with acute on chronic bilateral hip pain after a reported mechanical fall. No red flags of back pain reported or on exam. He has known peripheral arterial disease after bypass surgery and followed by vascular surgery, he reports claudication for over 2 years that he states is unchanged. He has follow-up with vascular surgery next week. Given a reported fall x-rays were obtained which showed stable hardware and no acute injury. Narcotic database was reviewed and patient has received monthly Percocet from multiple different providers for the last 1 year. He states his PCP has been trying to get him into a pain clinic. Given reported fall, I will give patient the benefit of the doubt and discharged with one day of Percocet. He is to call his  primary care doctor tomorrow for further management.  Final Clinical Impressions(s) / ED Diagnoses   Final diagnoses:  Fall, initial encounter    ED Discharge Orders        Ordered    oxyCODONE-acetaminophen (PERCOCET/ROXICET) 5-325 MG tablet  Every 6 hours PRN     03/05/17 1520  Liberty HandyGibbons, Breydon Senters J, PA-C 03/05/17 1537    Cathren LaineSteinl, Kevin, MD 03/10/17 2235

## 2017-03-13 ENCOUNTER — Encounter (HOSPITAL_COMMUNITY): Payer: Medicaid Other

## 2017-03-13 ENCOUNTER — Ambulatory Visit: Payer: Medicaid Other | Admitting: Family

## 2017-03-16 ENCOUNTER — Encounter (HOSPITAL_COMMUNITY): Payer: Medicaid Other

## 2017-03-16 ENCOUNTER — Ambulatory Visit: Payer: Medicaid Other | Admitting: Family

## 2017-04-07 ENCOUNTER — Other Ambulatory Visit: Payer: Self-pay

## 2017-04-07 ENCOUNTER — Ambulatory Visit (INDEPENDENT_AMBULATORY_CARE_PROVIDER_SITE_OTHER): Payer: Medicaid Other | Admitting: Family

## 2017-04-07 ENCOUNTER — Encounter: Payer: Self-pay | Admitting: Family

## 2017-04-07 ENCOUNTER — Ambulatory Visit (HOSPITAL_COMMUNITY)
Admission: RE | Admit: 2017-04-07 | Discharge: 2017-04-07 | Disposition: A | Discharge status: Home / Self Care | Payer: Medicaid Other | Source: Ambulatory Visit | Attending: Family | Admitting: Family

## 2017-04-07 VITALS — BP 145/95 | HR 97 | Temp 97.7°F | Resp 16 | Ht 69.0 in | Wt 144.0 lb

## 2017-04-07 DIAGNOSIS — F17200 Nicotine dependence, unspecified, uncomplicated: Secondary | ICD-10-CM | POA: Diagnosis not present

## 2017-04-07 DIAGNOSIS — I7409 Other arterial embolism and thrombosis of abdominal aorta: Secondary | ICD-10-CM

## 2017-04-07 DIAGNOSIS — Z95828 Presence of other vascular implants and grafts: Secondary | ICD-10-CM | POA: Diagnosis not present

## 2017-04-07 DIAGNOSIS — I739 Peripheral vascular disease, unspecified: Secondary | ICD-10-CM | POA: Diagnosis not present

## 2017-04-07 NOTE — Patient Instructions (Signed)
Steps to Quit Smoking Smoking tobacco can be bad for your health. It can also affect almost every organ in your body. Smoking puts you and people around you at risk for many serious long-lasting (chronic) diseases. Quitting smoking is hard, but it is one of the best things that you can do for your health. It is never too late to quit. What are the benefits of quitting smoking? When you quit smoking, you lower your risk for getting serious diseases and conditions. They can include:  Lung cancer or lung disease.  Heart disease.  Stroke.  Heart attack.  Not being able to have children (infertility).  Weak bones (osteoporosis) and broken bones (fractures).  If you have coughing, wheezing, and shortness of breath, those symptoms may get better when you quit. You may also get sick less often. If you are pregnant, quitting smoking can help to lower your chances of having a baby of low birth weight. What can I do to help me quit smoking? Talk with your doctor about what can help you quit smoking. Some things you can do (strategies) include:  Quitting smoking totally, instead of slowly cutting back how much you smoke over a period of time.  Going to in-person counseling. You are more likely to quit if you go to many counseling sessions.  Using resources and support systems, such as: ? Online chats with a counselor. ? Phone quitlines. ? Printed self-help materials. ? Support groups or group counseling. ? Text messaging programs. ? Mobile phone apps or applications.  Taking medicines. Some of these medicines may have nicotine in them. If you are pregnant or breastfeeding, do not take any medicines to quit smoking unless your doctor says it is okay. Talk with your doctor about counseling or other things that can help you.  Talk with your doctor about using more than one strategy at the same time, such as taking medicines while you are also going to in-person counseling. This can help make  quitting easier. What things can I do to make it easier to quit? Quitting smoking might feel very hard at first, but there is a lot that you can do to make it easier. Take these steps:  Talk to your family and friends. Ask them to support and encourage you.  Call phone quitlines, reach out to support groups, or work with a counselor.  Ask people who smoke to not smoke around you.  Avoid places that make you want (trigger) to smoke, such as: ? Bars. ? Parties. ? Smoke-break areas at work.  Spend time with people who do not smoke.  Lower the stress in your life. Stress can make you want to smoke. Try these things to help your stress: ? Getting regular exercise. ? Deep-breathing exercises. ? Yoga. ? Meditating. ? Doing a body scan. To do this, close your eyes, focus on one area of your body at a time from head to toe, and notice which parts of your body are tense. Try to relax the muscles in those areas.  Download or buy apps on your mobile phone or tablet that can help you stick to your quit plan. There are many free apps, such as QuitGuide from the CDC (Centers for Disease Control and Prevention). You can find more support from smokefree.gov and other websites.  This information is not intended to replace advice given to you by your health care provider. Make sure you discuss any questions you have with your health care provider. Document Released: 10/23/2008 Document   Revised: 08/25/2015 Document Reviewed: 05/13/2014 Elsevier Interactive Patient Education  2018 Elsevier Inc.     Peripheral Vascular Disease Peripheral vascular disease (PVD) is a disease of the blood vessels that are not part of your heart and brain. A simple term for PVD is poor circulation. In most cases, PVD narrows the blood vessels that carry blood from your heart to the rest of your body. This can result in a decreased supply of blood to your arms, legs, and internal organs, like your stomach or kidneys.  However, it most often affects a person's lower legs and feet. There are two types of PVD.  Organic PVD. This is the more common type. It is caused by damage to the structure of blood vessels.  Functional PVD. This is caused by conditions that make blood vessels contract and tighten (spasm).  Without treatment, PVD tends to get worse over time. PVD can also lead to acute ischemic limb. This is when an arm or limb suddenly has trouble getting enough blood. This is a medical emergency. Follow these instructions at home:  Take medicines only as told by your doctor.  Do not use any tobacco products, including cigarettes, chewing tobacco, or electronic cigarettes. If you need help quitting, ask your doctor.  Lose weight if you are overweight, and maintain a healthy weight as told by your doctor.  Eat a diet that is low in fat and cholesterol. If you need help, ask your doctor.  Exercise regularly. Ask your doctor for some good activities for you.  Take good care of your feet. ? Wear comfortable shoes that fit well. ? Check your feet often for any cuts or sores. Contact a doctor if:  You have cramps in your legs while walking.  You have leg pain when you are at rest.  You have coldness in a leg or foot.  Your skin changes.  You are unable to get or have an erection (erectile dysfunction).  You have cuts or sores on your feet that are not healing. Get help right away if:  Your arm or leg turns cold and blue.  Your arms or legs become red, warm, swollen, painful, or numb.  You have chest pain or trouble breathing.  You suddenly have weakness in your face, arm, or leg.  You become very confused or you cannot speak.  You suddenly have a very bad headache.  You suddenly cannot see. This information is not intended to replace advice given to you by your health care provider. Make sure you discuss any questions you have with your health care provider. Document Released:  03/23/2009 Document Revised: 06/04/2015 Document Reviewed: 06/06/2013 Elsevier Interactive Patient Education  2017 Elsevier Inc.  

## 2017-04-07 NOTE — Progress Notes (Signed)
Vitals:   04/07/17 1548  BP: (!) 151/93  Pulse: 95  Resp: 16  Temp: 97.7 F (36.5 C)  TempSrc: Oral  SpO2: 98%  Weight: 144 lb (65.3 kg)  Height: 5\' 9"  (1.753 m)

## 2017-04-07 NOTE — Progress Notes (Signed)
[facility] [place]   CC: Follow up peripheral artery occlusive disease  History of Present Illness James Stevens is a 57 y.o. male who is s/p aortobifemoral bypass grafting on 10/07/13 by Dr. Darrick Penna for aortoiliac occlusive disease.  He states one leg became shorter than the other after his bilateral hip replacements.  In March 2018 he had L4-S1 surgery. He also has had lumbar spine surgery in the past. He states pain relief in his low back since the above surgery. He reports pain in lateral aspects of both hips and right calf after walking about 100 feet, if he walks further then his left calf will hurt, seems to be relieved by rest.   He denies non healing wounds in his feet/legs.  Pt Diabetic: No Pt smoker: smoker (3 cigarettes, started at age 23yrs)  Pt meds include: Statin :No, pt states his cholesterol was "alright" Betablocker: No ASA: Yes Other anticoagulants/antiplatelets: no     Past Medical History:  Diagnosis Date  . Arthritis   . Bipolar affective (HCC)   . Chronic back pain    and right hip  . Depression   . GERD (gastroesophageal reflux disease)    occ tums  . Headache(784.0)    migraines  . Hip pain   . 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 Peripheral vascular disease (HCC)   . Schizophrenia [facility])     Social History Social History   Tobacco Use  . Smoking status: Current Every Day Smoker    Packs/day: 0.20    Years: 34.00    Pack years: 6.80    Types: Cigarettes  . Smokeless tobacco: Never Used  . Tobacco comment: 3-4 cigarettes per day  Substance Use Topics  . Alcohol use: Yes    Comment: once a week   . Drug use: Yes    Frequency: 2.0 times per week    Types: Cocaine, Marijuana    Family History Family History  Problem Relation Age of Onset  . Diabetes Mother   . Diabetes Father   . Diabetes Other   . Diabetes Brother     Past Surgical  History:  Procedure Laterality Date  . AORTA - BILATERAL FEMORAL ARTERY BYPASS GRAFT N/A 10/07/2013   Procedure: AORTA BIFEMORAL BYPASS GRAFT;  Surgeon: Sherren Kerns, MD;  Location: [facility] OR;  Service: Vascular;  Laterality: N/A;  . ESOPHAGEAL DILATION    . ESOPHAGOGASTRODUODENOSCOPY  09/06/2011   Procedure: ESOPHAGOGASTRODUODENOSCOPY (EGD);  Surgeon: Vertell Novak., MD;  Location: [facility];  Service: Endoscopy;  Laterality: N/A;  . FRACTURE SURGERY     left fingers # 4,5  . JOINT REPLACEMENT Bilateral    rt hip replacement, and  LT hip  . LUMBAR LAMINECTOMY N/A 05/05/2014   Procedure: BILATERAL LUMBAR LAMINECTOMY L4-5 AND L5-S1;  Surgeon: Kerrin Champagne, MD;  Location: [facility] OR;  Service: Orthopedics;  Laterality: N/A;  . MANDIBLE FRACTURE SURGERY    . SAVORY DILATION  09/06/2011   Procedure: SAVORY DILATION;  Surgeon: Vertell Novak., MD;  Location: [facility];  Service: Endoscopy;  Laterality: N/A;  . TOTAL HIP ARTHROPLASTY Left 04/17/2012   Procedure: LEFT TOTAL HIP ARTHROPLASTY ANTERIOR APPROACH;  Surgeon: Kathryne Hitch, MD;  Location: [facility] OR;  Service: Orthopedics;  Laterality: Left;    Allergies  Allergen Reactions  . Ibuprofen Other (See Comments)    Gastric irritation  . Tramadol Itching  Current Outpatient Medications  Medication Sig Dispense Refill  . dicyclomine (BENTYL) 10 MG capsule TK ONE C PO  TID PRN  2  . meloxicam (MOBIC) 7.5 MG tablet TK 1 T PO BID WITH FOOD PRF PAINS  5  . oxyCODONE-acetaminophen (PERCOCET/ROXICET) 5-325 MG tablet TK 1 T PO TID  0  . HYDROcodone-acetaminophen (NORCO/VICODIN) 5-325 MG tablet Take 1 tablet by mouth every 6 (six) hours as needed for moderate pain.     No current facility-administered medications for this visit.    Facility-Administered Medications Ordered in Other Visits  Medication Dose Route Frequency Provider Last Rate Last Dose  . lactated ringers infusion    Continuous PRN Nils Pyle, CRNA      .  lactated ringers infusion    Continuous PRN Nils Pyle, CRNA        ROS: See HPI for pertinent positives and negatives.   Physical Examination  Vitals:   04/07/17 1548 04/07/17 1552  BP: (!) 151/93 (!) 145/95  Pulse: 95 97  Resp: 16   Temp: 97.7 F (36.5 C)   TempSrc: Oral   SpO2: 98%   Weight: 144 lb (65.3 kg)   Height: 5\' 9"  (1.753 m)    Body mass index is 21.27 kg/m.  General: A&O x 3, male. Gait: seated in his motorized w/c HENT: No gross abnormalities  Eyes: PERRLA. Pulmonary: Respirations are non labored, CTAB, no wheezes, rales, or rhonchi. Cardiac: regular rhythm and rate, nodetected murmur.    Carotid Bruits Right Left   Negative Negative   Abdominal aortic pulse is notpalpable. Radial pulses: 2+ palpable and =  VASCULAR EXAM: Extremitieswithoutischemic changes, withoutGangrene; withoutopen wounds.  LE Pulses Right Left  FEMORAL 2+palpable 2+palpable   POPLITEAL notpalpable  notpalpable  POSTERIOR TIBIAL not palpable  1+palpable   DORSALIS PEDIS ANTERIOR TIBIAL not palpable  2+palpable    Abdomen: soft, NT, no palpable masses. Skin: no rashes, no cellulitis, no ulcers noted. Musculoskeletal: no muscle wasting or atrophy.  Neurologic: A&O X 3; appropriate affect, Sensation is normal; MOTOR FUNCTION:  moving all extremities equally, motor strength 5/5 throughout. Speech is fluent/normal. CN 2-12 intact. Psychiatric: Thought content is normal, mood appropriate for clinical situation.     ASSESSMENT: James Stevens is a 57 y.o. male who is s/p aortobifemoral bypass grafting on 10/07/13. The pain that he has in his hips with walking is not due to lack of arterial perfusion, his bilateral femoral pulses are 2+ palpable.   His primary atherosclerotic risk factor remains active smoking since age 33, but he has decreased use to 3 cigarettes/day. Fortunately  he does not have DM, he has a normal BMI, and walks a good part of his day.  Pain in his hips seem to be worsening with walking; states he has been to his orthopedic surgeon re this.    DATA  ABI (Date: 04/07/2017):  R:   ABI: 0.73 (was 0.63 on 09-05-16),   PT: mono  DP: mono  TBI:  0.56 (was 0.32)  L:   ABI: 0.99 (was 0.84),   PT: bi  DP: bi  TBI: 0.68 (was 0.65)  Improved bilateral ABI and TBI; moderate disease on the right with monophasic waveforms; no significant disease on the left with biphasic waveforms.    PLAN:  The patient was counseled re smoking cessation and given several free resources re smoking cessation.   Graduated walking program discussed and how to achieve.  The patient was counseled re smoking cessation and  given several free resources re smoking cessation. Follow up in 1 year with ABI's. I advised him to notify us if he develops non healing wounds in his feet or legs, or the pain in his legs gets worse with walking.    I discussed in depth with the patient the nature of atherosclerosis, and emphasized the importance of maximal medical management including strict control of blood pressure, blood glucose, and lipid levels, obtaining regular exercise, and cessation of smoking.  The patient is aware that without maximal medical management the underlying atherosclerotic disease process will progress, limiting the benefit of any interventions.  The patient was given information about PAD including signs, symptoms, treatment, what symptoms should prompt the patient to seek immediate medical care, and risk reduction measures to take.  Charisse MarchSuzanne Billyjoe Go, RN, MSN, FNP-C [facility] [place] Office Phone: [phone]  Clinic MD: Randie HeinzCain on call  04/07/17 3:58 PM

## 2017-07-03 ENCOUNTER — Ambulatory Visit (INDEPENDENT_AMBULATORY_CARE_PROVIDER_SITE_OTHER): Payer: Medicaid Other | Admitting: Orthopaedic Surgery

## 2017-07-03 ENCOUNTER — Encounter (INDEPENDENT_AMBULATORY_CARE_PROVIDER_SITE_OTHER): Payer: Self-pay | Admitting: Orthopaedic Surgery

## 2017-07-03 ENCOUNTER — Ambulatory Visit (INDEPENDENT_AMBULATORY_CARE_PROVIDER_SITE_OTHER): Payer: Self-pay

## 2017-07-03 DIAGNOSIS — M25551 Pain in right hip: Secondary | ICD-10-CM | POA: Diagnosis not present

## 2017-07-03 DIAGNOSIS — Z96643 Presence of artificial hip joint, bilateral: Secondary | ICD-10-CM | POA: Insufficient documentation

## 2017-07-03 DIAGNOSIS — M25552 Pain in left hip: Secondary | ICD-10-CM

## 2017-07-03 NOTE — Progress Notes (Signed)
Office Visit Note   Patient: James Stevens           Date of Birth: Jul 07, 1960           MRN: [id number] Visit Date: 07/03/2017              Requested by: Fleet Contras, MD [address] [place], [place] [address] PCP: Fleet Contras, MD   Assessment & Plan: Visit Diagnoses:  1. Bilateral hip pain   2. History of bilateral hip replacements     Plan: I did watch him walk down the hall and he does have a slight Trendelenburg gait.  Some of this may be related to his lumbar spine and previous multiple back surgeries.  His leg length discrepancy on plain films shows his only a small amount.  I will at least send him to biotech for a insert just for his right shoe.  I do feel he benefit from outpatient physical therapy though to work on bilateral hip and bilateral quad strengthening.  Follow-Up Instructions: Return in about 3 months (around 10/03/2017).   Orders:  Orders Placed This Encounter  Procedures  . XR Pelvis 1-2 Views   No orders of the defined types were placed in this encounter.     Procedures: No procedures performed   Clinical Data: No additional findings.   Subjective: Chief Complaint  Patient presents with  . Right Hip - Pain  The patient is listed as a new patient because I have not seen him for a very long period of time.  He is very thin individual who is only 57 years old however, he has significant medical issues.  I have actually replaced both of his hips.  Since then though he has had back surgery and heart bypass surgery.  He feels weak in his legs.  He feels like there is a leg length difference with his right hip for his left.  That is become problematic for him he states.  However he barely ambulates and he gets around and mainly a Tourist information centre manager wheelchair.  HPI  Review of Systems Currently denies any fever, chills, nausea, vomiting.  He also denies currently any shortness of breath or chest pain.  Objective: Vital Signs: There were  no vitals taken for this visit.  Physical Exam He is alert and oriented no acute distress Ortho Exam I had him lay supine on the exam table.  He is able to get up there slowly.  I but both hips through full range of motion with no instability on my exam and no pain in the groin on range of motion of either hip.  When he first laid down it did appear that his right hip is slightly shorter than the left however as I get him to relax and not make sure his pelvis knees and feet were the same level I could pull both legs down and his leg length discrepancy is barely negligible.  I do feel that some of this is just weakness and deconditioning in general. Specialty Comments:  No specialty comments available.  Imaging: Xr Pelvis 1-2 Views  Result Date: 07/03/2017 An AP pelvis standing shows bilateral total hip arthroplasty with no complicating features.    PMFS History: Patient Active Problem List   Diagnosis Date Noted  . History of bilateral hip replacements 07/03/2017  . Bilateral hip pain 07/03/2017  . Lumbar herniated disc 03/23/2016  . 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  HNP (herniated nucleus pulposus), lumbar 05/05/2014    Class: Chronic  . PVD (peripheral vascular disease) (HCC) 10/30/2013  . Aortoiliac occlusive disease (HCC) 10/07/2013  . Atherosclerosis of native arteries of the extremities with intermittent claudication 09/05/2013  . Peripheral vascular disease, unspecified (HCC) 08/29/2013  . Avascular necrosis of hip (HCC) 04/17/2012   Past Medical History:  Diagnosis Date  . Arthritis   . Bipolar affective (HCC)   . Chronic back pain    and right hip  . Depression   . GERD (gastroesophageal reflux disease)    occ tums  . Headache(784.0)    migraines  . Hip pain   . 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 Peripheral vascular disease (HCC)   .  Schizophrenia (HCC)     Family History  Problem Relation Age of Onset  . Diabetes Mother   . Diabetes Father   . Diabetes Other   . Diabetes Brother     Past Surgical History:  Procedure Laterality Date  . AORTA - BILATERAL FEMORAL ARTERY BYPASS GRAFT N/A 10/07/2013   Procedure: AORTA BIFEMORAL BYPASS GRAFT;  Surgeon: Sherren Kernsharles E Fields, MD;  [facility];  Service: Vascular;  Laterality: N/A;  . ESOPHAGEAL DILATION    . ESOPHAGOGASTRODUODENOSCOPY  09/06/2011   Procedure: ESOPHAGOGASTRODUODENOSCOPY (EGD);  Surgeon: Vertell NovakJames L Edwards Jr., MD;  [facility];  Service: Endoscopy;  Laterality: N/A;  . FRACTURE SURGERY     left fingers # 4,5  . JOINT REPLACEMENT Bilateral    rt hip replacement, and  LT hip  . LUMBAR LAMINECTOMY N/A 05/05/2014   Procedure: BILATERAL LUMBAR LAMINECTOMY L4-5 AND L5-S1;  Surgeon: Kerrin ChampagneJames E Nitka, MD;  Location: [facility] OR;  Service: Orthopedics;  Laterality: N/A;  . MANDIBLE FRACTURE SURGERY    . SAVORY DILATION  09/06/2011   Procedure: SAVORY DILATION;  Surgeon: Vertell NovakJames L Edwards Jr., MD;  [facility];  Service: Endoscopy;  Laterality: N/A;  . TOTAL HIP ARTHROPLASTY Left 04/17/2012   Procedure: LEFT TOTAL HIP ARTHROPLASTY ANTERIOR APPROACH;  Surgeon: Kathryne Hitchhristopher Y Riddik Senna, MD;  Location: [facility] OR;  Service: Orthopedics;  Laterality: Left;   Social History   Occupational History  . Not on file  Tobacco Use  . Smoking status: Current Every Day Smoker    Packs/day: 0.20    Years: 34.00    Pack years: 6.80    Types: Cigarettes  . Smokeless tobacco: Never Used  . Tobacco comment: 3-4 cigarettes per day  Substance and Sexual Activity  . Alcohol use: Yes    Comment: once a week   . Drug use: Yes    Frequency: 2.0 times per week    Types: Cocaine, Marijuana  . Sexual activity: Not on file

## 2017-07-04 ENCOUNTER — Other Ambulatory Visit (INDEPENDENT_AMBULATORY_CARE_PROVIDER_SITE_OTHER): Payer: Self-pay

## 2017-07-04 DIAGNOSIS — M79605 Pain in left leg: Principal | ICD-10-CM

## 2017-07-04 DIAGNOSIS — M79604 Pain in right leg: Secondary | ICD-10-CM

## 2017-07-15 ENCOUNTER — Other Ambulatory Visit: Payer: Self-pay

## 2017-07-15 ENCOUNTER — Emergency Department (HOSPITAL_COMMUNITY)
Admission: EM | Admit: 2017-07-15 | Discharge: 2017-07-15 | Disposition: A | Discharge status: Home / Self Care | Payer: Medicaid Other | Attending: Emergency Medicine | Admitting: Emergency Medicine

## 2017-07-15 ENCOUNTER — Emergency Department (HOSPITAL_COMMUNITY): Payer: Medicaid Other

## 2017-07-15 DIAGNOSIS — Z96641 Presence of right artificial hip joint: Secondary | ICD-10-CM | POA: Insufficient documentation

## 2017-07-15 DIAGNOSIS — F1410 Cocaine abuse, uncomplicated: Secondary | ICD-10-CM | POA: Insufficient documentation

## 2017-07-15 DIAGNOSIS — Z96642 Presence of left artificial hip joint: Secondary | ICD-10-CM | POA: Insufficient documentation

## 2017-07-15 DIAGNOSIS — F17210 Nicotine dependence, cigarettes, uncomplicated: Secondary | ICD-10-CM | POA: Diagnosis not present

## 2017-07-15 DIAGNOSIS — F1210 Cannabis abuse, uncomplicated: Secondary | ICD-10-CM | POA: Insufficient documentation

## 2017-07-15 DIAGNOSIS — G8929 Other chronic pain: Secondary | ICD-10-CM | POA: Diagnosis not present

## 2017-07-15 DIAGNOSIS — M545 Low back pain, unspecified: Secondary | ICD-10-CM

## 2017-07-15 DIAGNOSIS — M549 Dorsalgia, unspecified: Secondary | ICD-10-CM | POA: Diagnosis present

## 2017-07-15 MED ORDER — OXYCODONE-ACETAMINOPHEN 5-325 MG PO TABS
1.0000 | ORAL_TABLET | Freq: Once | ORAL | Status: AC
  Administered 2017-07-15: 1 via ORAL
  Filled 2017-07-15: qty 1

## 2017-07-15 MED ORDER — PREDNISONE 20 MG PO TABS
60.0000 mg | ORAL_TABLET | ORAL | Status: AC
  Administered 2017-07-15: 60 mg via ORAL
  Filled 2017-07-15: qty 3

## 2017-07-15 MED ORDER — DICLOFENAC EPOLAMINE 1.3 % TD PTCH
1.0000 | MEDICATED_PATCH | Freq: Two times a day (BID) | TRANSDERMAL | Status: DC
  Administered 2017-07-15: 1 via TRANSDERMAL
  Filled 2017-07-15: qty 1

## 2017-07-15 MED ORDER — LIDOCAINE 5 % EX PTCH: 1.0000 | MEDICATED_PATCH | CUTANEOUS | 0 refills | Status: DC

## 2017-07-15 MED ORDER — OXYCODONE-ACETAMINOPHEN 5-325 MG PO TABS: 1.0000 | ORAL_TABLET | Freq: Four times a day (QID) | ORAL | 0 refills | Status: DC | PRN

## 2017-07-15 MED ORDER — PREDNISONE 20 MG PO TABS: 40.0000 mg | ORAL_TABLET | Freq: Every day | ORAL | 0 refills | Status: DC

## 2017-07-15 NOTE — ED Notes (Signed)
Patient transported to X-ray 

## 2017-07-15 NOTE — ED Notes (Signed)
D/c reviewed with patient 

## 2017-07-15 NOTE — ED Triage Notes (Signed)
Pt to Ed with back pain. Hx of chronic back pain, recently referred to pain clinic but sts it will be three weeks before he can be seen there. Pain worse than usual  in last four days.

## 2017-07-15 NOTE — Discharge Instructions (Signed)
As discussed, today's evaluation has been generally reassuring. Please be sure to continue enrollment in your pain management clinic, and if you need additional assistance, follow-up with your physician or your neurosurgeon. Please use the prescribed lidocaine patches for pain relief.  Return here for concerning changes in your condition.

## 2017-07-15 NOTE — ED Provider Notes (Signed)
[facility] EMERGENCY DEPARTMENT Provider Note   CSN: [id number] Arrival date & time: 07/15/17  4098     History   Chief Complaint Chief Complaint  Patient presents with  . Back Pain    HPI DEMAURION DICIOCCIO is a 57 y.o. male.  HPI Patient presents with concern of back pain. Patient has history of chronic back pain, status post 2 prior surgery, most recently 1 year ago. He is scheduled to enroll in pain management in 3 weeks, no longer has his pain medication. He notes that over the past 2 or 3 days the pain has become worse, sore, severe, worse with motion, prohibiting sleep. Pain is occasionally controlled with Percocet, though the patient has none of this medication currently. No new incontinence, falling, weakness of his lower extremities, fever, chills.  Past Medical History:  Diagnosis Date  . Arthritis   . Bipolar affective (HCC)   . Chronic back pain    and right hip  . Depression   . GERD (gastroesophageal reflux disease)    occ tums  . Headache(784.0)    migraines  . Hip pain   . 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 Peripheral vascular disease (HCC)   . Schizophrenia [facility])     Patient Active Problem List   Diagnosis Date Noted  . History of bilateral hip replacements 07/03/2017  . Bilateral hip pain 07/03/2017  . Lumbar herniated disc 03/23/2016  . 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 HNP (herniated nucleus pulposus), lumbar 05/05/2014    Class: Chronic  . PVD (peripheral vascular disease) (HCC) 10/30/2013  . Aortoiliac occlusive disease (HCC) 10/07/2013  . Atherosclerosis of native arteries of the extremities with intermittent claudication 09/05/2013  . Peripheral vascular disease, unspecified (HCC) 08/29/2013  . Avascular necrosis of hip (HCC) 04/17/2012    Past Surgical History:  Procedure Laterality Date  .  AORTA - BILATERAL FEMORAL ARTERY BYPASS GRAFT N/A 10/07/2013   Procedure: AORTA BIFEMORAL BYPASS GRAFT;  Surgeon: Sherren Kerns, MD;  Location: [facility] OR;  Service: Vascular;  Laterality: N/A;  . ESOPHAGEAL DILATION    . ESOPHAGOGASTRODUODENOSCOPY  09/06/2011   Procedure: ESOPHAGOGASTRODUODENOSCOPY (EGD);  Surgeon: Vertell Novak., MD;  [facility];  Service: Endoscopy;  Laterality: N/A;  . FRACTURE SURGERY     left fingers # 4,5  . JOINT REPLACEMENT Bilateral    rt hip replacement, and  LT hip  . LUMBAR LAMINECTOMY N/A 05/05/2014   Procedure: BILATERAL LUMBAR LAMINECTOMY L4-5 AND L5-S1;  Surgeon: Kerrin Champagne, MD;  Location[facility] OR;  Service: Orthopedics;  Laterality: N/A;  . MANDIBLE FRACTURE SURGERY    . SAVORY DILATION  09/06/2011   Procedure: SAVORY DILATION;  Surgeon: Vertell Novak., MD;  [facility];  Service: Endoscopy;  Laterality: N/A;  . TOTAL HIP ARTHROPLASTY Left 04/17/2012   Procedure: LEFT TOTAL HIP ARTHROPLASTY ANTERIOR APPROACH;  Surgeon: Kathryne Hitch, MD;  Location: [facility] OR;  Service: Orthopedics;  Laterality: Left;        Home Medications    Prior to Admission medications   Not on File    Family History Family History  Problem Relation Age of Onset  . Diabetes Mother   . Diabetes Father   . Diabetes Other   . Diabetes Brother     Social History Social History   Tobacco Use  . Smoking  status: Current Every Day Smoker    Packs/day: 0.20    Years: 34.00    Pack years: 6.80    Types: Cigarettes  . Smokeless tobacco: Never Used  . Tobacco comment: 3-4 cigarettes per day  Substance Use Topics  . Alcohol use: Yes    Comment: once a week   . Drug use: Yes    Frequency: 2.0 times per week    Types: Cocaine, Marijuana     Allergies   Ibuprofen and Tramadol   Review of Systems Review of Systems  Constitutional:       Per HPI, otherwise negative  HENT:       Per HPI, otherwise negative  Respiratory:       Per  HPI, otherwise negative  Cardiovascular:       Per HPI, otherwise negative  Gastrointestinal: Negative for vomiting.  Endocrine:       Negative aside from HPI  Genitourinary:       Neg aside from HPI   Musculoskeletal:       Per HPI, otherwise negative  Skin: Negative.   Neurological: Negative for syncope.     Physical Exam Updated Vital Signs BP 119/80   Pulse 80   Temp 98.7 F (37.1 C) (Oral)   Resp 20   Ht 5\' 9"  (1.753 m)   Wt 64.9 kg (143 lb)   SpO2 100%   BMI 21.12 kg/m   Physical Exam  Constitutional: He is oriented to person, place, and time. He appears well-developed. No distress.  HENT:  Head: Normocephalic and atraumatic.  Eyes: Conjunctivae and EOM are normal.  Cardiovascular: Normal rate and regular rhythm.  Pulmonary/Chest: Effort normal. No stridor. No respiratory distress.  Abdominal: He exhibits no distension.  Musculoskeletal: He exhibits no edema.  Neurological: He is alert and oriented to person, place, and time.  Skin: Skin is warm and dry.  Psychiatric: He has a normal mood and affect.  Nursing note and vitals reviewed.    ED Treatments / Results  Labs (all labs ordered are listed, but only abnormal results are displayed) Labs Reviewed - No data to display  EKG None  Radiology Dg Lumbar Spine Complete  Result Date: 07/15/2017 CLINICAL DATA:  Chronic lower back pain.  Worsening symptoms. EXAM: LUMBAR SPINE - COMPLETE 4+ VIEW COMPARISON:  03/05/2017. FINDINGS: L4-S1 PLIF appears stable. Intervertebral disc spaces above this remain normal in appearance. Anatomic alignment. No hardware failure. BILATERAL hip replacement. No fracture. IMPRESSION: L4-S1 PLIF.  Stable appearance. Electronically Signed   By: Elsie Stain M.D.   On: 07/15/2017 08:08    Procedures Procedures (including critical care time)  Medications Ordered in ED Medications  diclofenac (FLECTOR) 1.3 % 1 patch (has no administration in time range)  oxyCODONE-acetaminophen  (PERCOCET/ROXICET) 5-325 MG per tablet 1 tablet (1 tablet Oral Given 07/15/17 0745)  predniSONE (DELTASONE) tablet 60 mg (60 mg Oral Given 07/15/17 0745)     Initial Impression / Assessment and Plan / ED Course  I have reviewed the triage vital signs and the nursing notes.  Pertinent labs & imaging results that were available during my care of the patient were reviewed by me and considered in my medical decision making (see chart for details).     9:31 AM On repeat exam the patient states that he is feeling somewhat better has been able to sleep. Patient has received Percocet and a diclofenac patch. We discussed his x-ray which was unremarkable, performed due to his history of recent surgery.  No evidence for hardware instability, nor other acute changes. Given his improvement here, absence of red flags for neurologic phenomena, patient discharged with encouragement to follow-up with his neurosurgeon, and continue towards enrollment in pain management. Patient will start lidocaine patches for additional pain control.   Final Clinical Impressions(s) / ED Diagnoses  Acute on chronic low back pain   Gerhard MunchLockwood, Zelphia Glover, MD 07/15/17 [phone]

## 2017-10-04 ENCOUNTER — Ambulatory Visit (INDEPENDENT_AMBULATORY_CARE_PROVIDER_SITE_OTHER): Payer: Medicaid Other | Admitting: Orthopaedic Surgery

## 2017-11-03 IMAGING — CT CT PELVIS W/ CM
2 of 6 series · 14 of 46 positions shown, 16 images · IV contrast (Iodine)
Comparison: Noncontrast CT 11/04/2015

CLINICAL DATA: Left groin pain for 1 hour. Left inguinal pain with
2 tender masses, possible lymph nodes on exam.

EXAM:
CT PELVIS WITH CONTRAST
TECHNIQUE: Multidetector CT imaging of the pelvis was performed using the
standard protocol following the bolus administration of intravenous
contrast.
CONTRAST:  100mL K29S5F-9PP IOPAMIDOL (K29S5F-9PP) INJECTION 61%

[Series 205: o-mar, routine, idose (2) · axial · 0.78mm/px · z∈[+754,+994]mm · 11 of 56 slices shown, 13 images]
[im 4/56  soft-tissue]
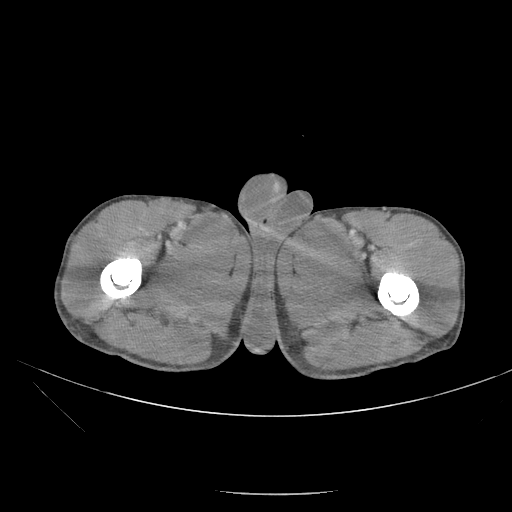
[im 4/56  bone]
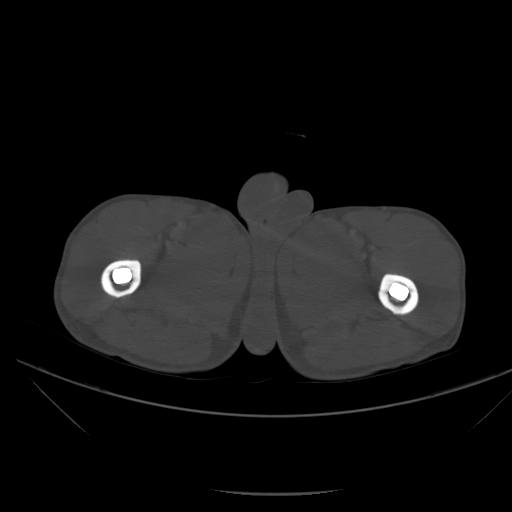
[im 8/56  soft-tissue]
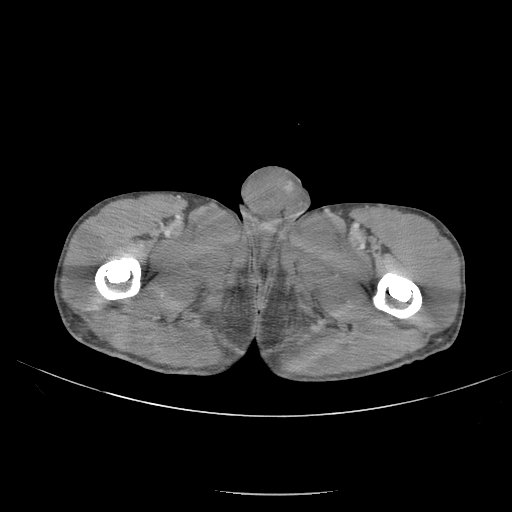
[im 15/56  soft-tissue]
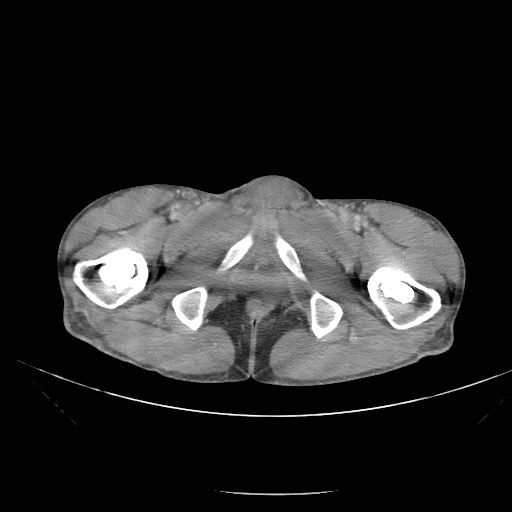
[im 19/56  soft-tissue]
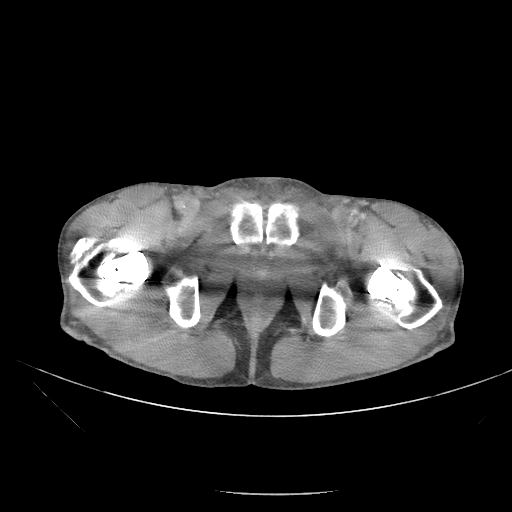
[im 23/56  soft-tissue]
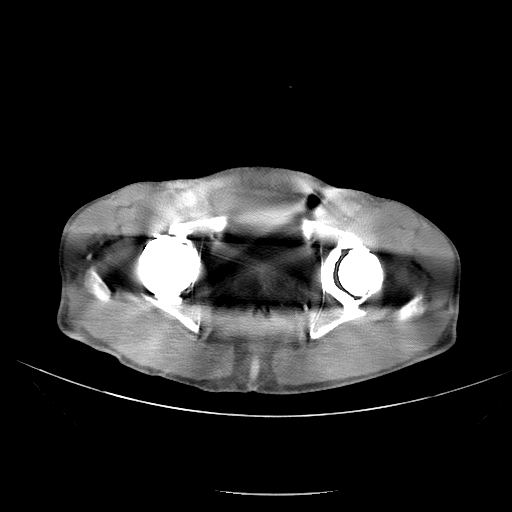
[im 30/56  soft-tissue]
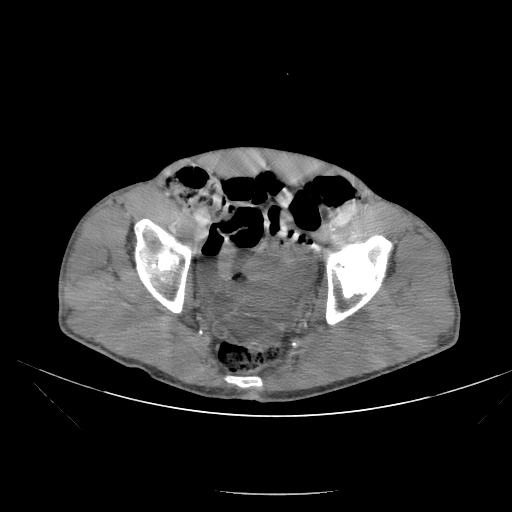
[im 34/56  soft-tissue]
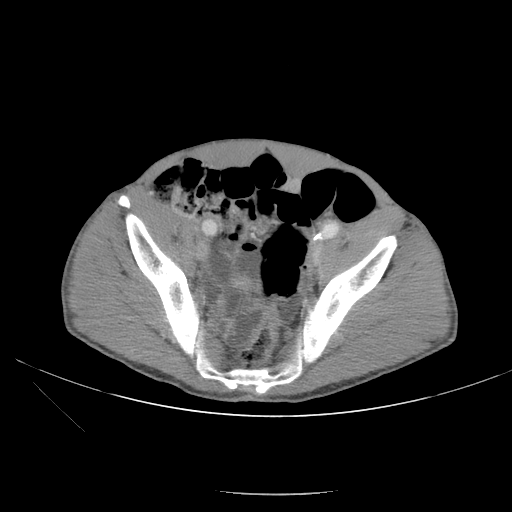
[im 37/56  soft-tissue]
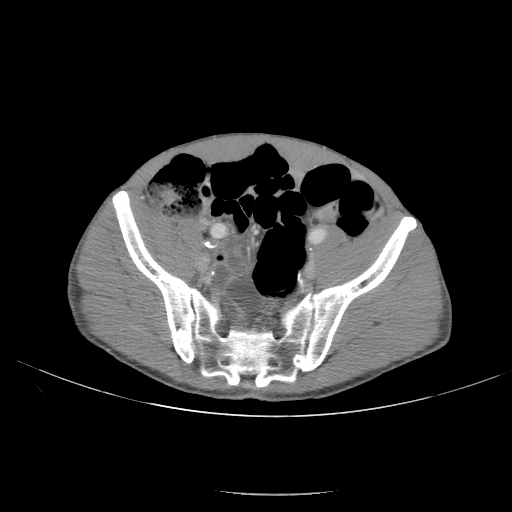
[im 41/56  soft-tissue]
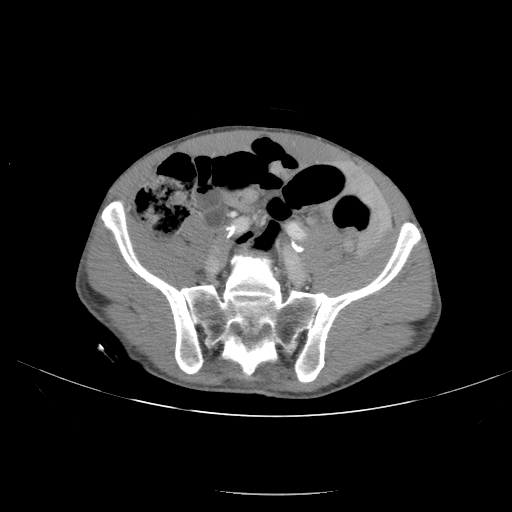
[im 41/56  bone]
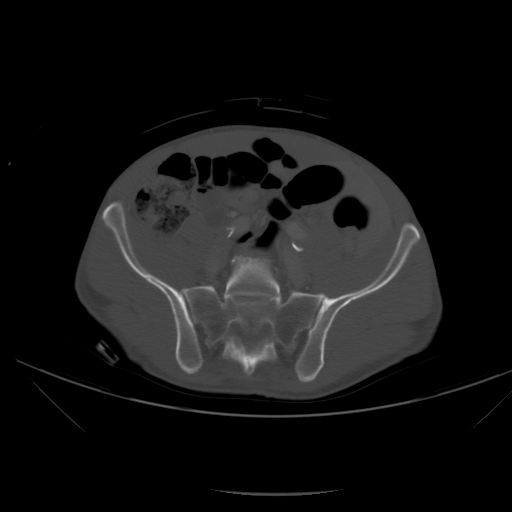
[im 48/56  soft-tissue]
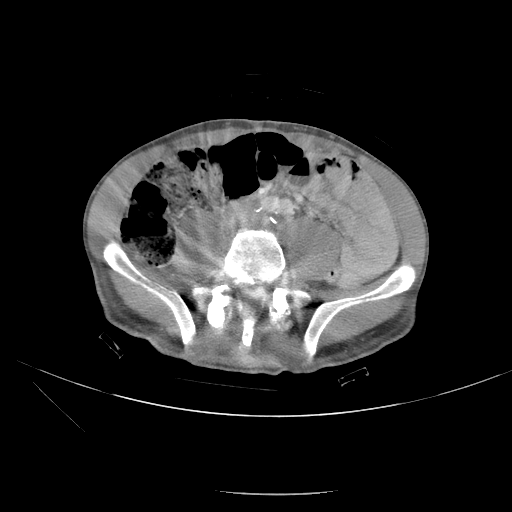
[im 52/56  soft-tissue]
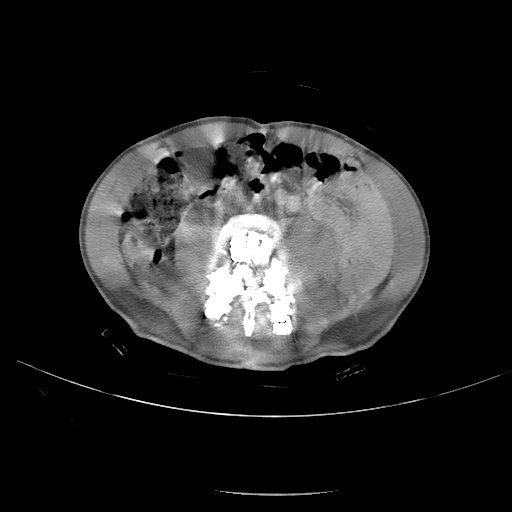

[Series 207: o-mar, coronals, idose (2) · coronal · 0.45mm/px · 3 of 92 slices shown]
[im 23/92  soft-tissue]
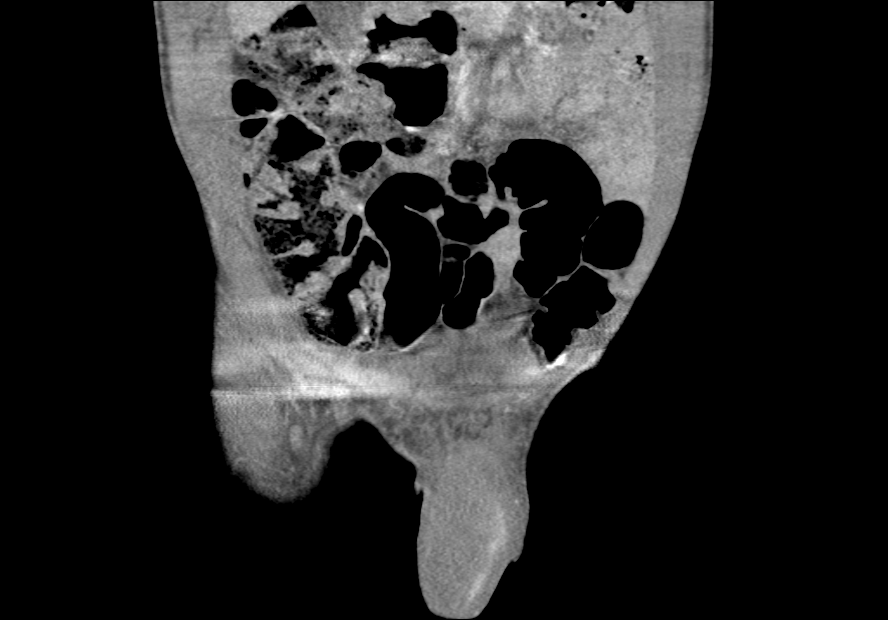
[im 46/92  soft-tissue]
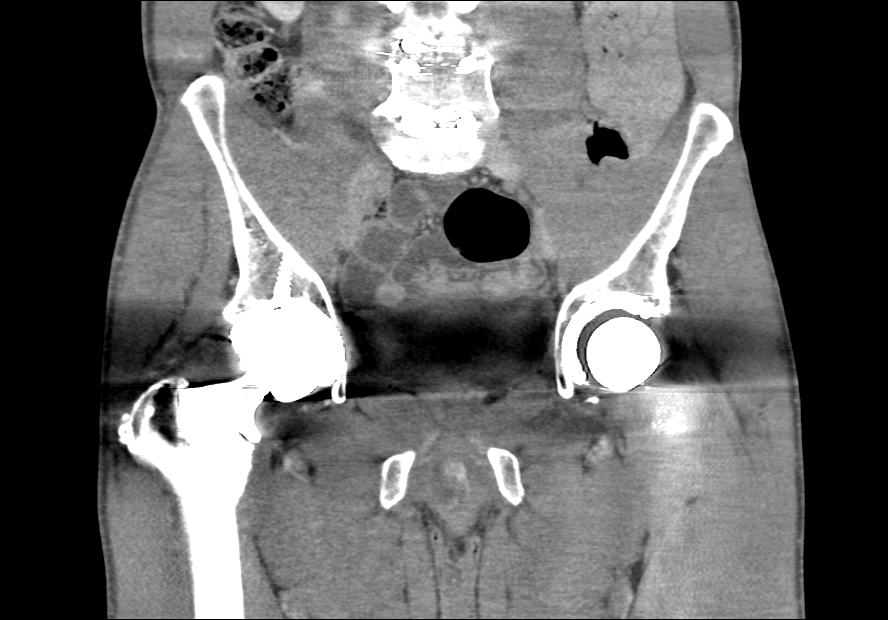
[im 69/92  soft-tissue]
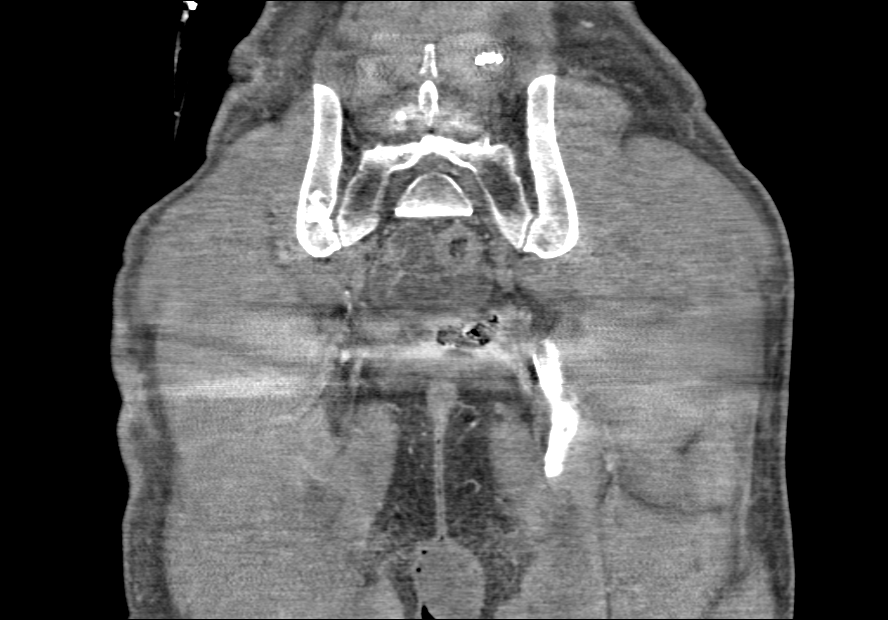

[14 of 46 positions shown; findings below may reference images not displayed]

FINDINGS: Urinary Tract: Urinary bladder obscured by streak artifact from
bilateral hip prostheses.

Bowel: Visualized small and large bowel loops appear normal, streak
artifact limits assessment.

Vascular/Lymphatic: Aorto bi-iliac bypass graft. Inguinal/external
iliac portions obscured by streak artifact from bilateral hip
prostheses.

Reproductive:  Prostate gland obscured by streak artifact.

Other: Significant streak artifact from bilateral hip arthroplasties
limits assessment of the inguinal regions. No bulky lymphadenopathy.

Musculoskeletal: Bilateral hip arthroplasties appear intact.
Posterior fusion in the lumbosacral spine is partially included.
Benign-appearing sclerotic lesion in the right iliac bone, small
peripherally sclerotic left sacral lesion, nonaggressive.
IMPRESSION: Area of clinical concern in the left inguinal region is
significantly obscured by streak artifact from bilateral hip
prostheses. No bulky adenopathy is visualized, however regional
visualization is limited. Ultrasound may be of value for further
evaluation.

## 2018-01-04 ENCOUNTER — Other Ambulatory Visit: Payer: Self-pay

## 2018-01-04 ENCOUNTER — Encounter (HOSPITAL_COMMUNITY): Payer: Self-pay | Admitting: Pharmacy Technician

## 2018-01-04 ENCOUNTER — Emergency Department (HOSPITAL_COMMUNITY)
Admission: EM | Admit: 2018-01-04 | Discharge: 2018-01-04 | Disposition: A | Discharge status: Home / Self Care | Payer: Medicaid Other | Attending: Emergency Medicine | Admitting: Emergency Medicine

## 2018-01-04 DIAGNOSIS — I739 Peripheral vascular disease, unspecified: Secondary | ICD-10-CM | POA: Diagnosis not present

## 2018-01-04 DIAGNOSIS — F1290 Cannabis use, unspecified, uncomplicated: Secondary | ICD-10-CM | POA: Insufficient documentation

## 2018-01-04 DIAGNOSIS — F1490 Cocaine use, unspecified, uncomplicated: Secondary | ICD-10-CM | POA: Diagnosis not present

## 2018-01-04 DIAGNOSIS — F17210 Nicotine dependence, cigarettes, uncomplicated: Secondary | ICD-10-CM | POA: Diagnosis not present

## 2018-01-04 DIAGNOSIS — F250 Schizoaffective disorder, bipolar type: Secondary | ICD-10-CM | POA: Diagnosis not present

## 2018-01-04 DIAGNOSIS — Z036 Encounter for observation for suspected toxic effect from ingested substance ruled out: Secondary | ICD-10-CM | POA: Insufficient documentation

## 2018-01-04 DIAGNOSIS — Z202 Contact with and (suspected) exposure to infections with a predominantly sexual mode of transmission: Secondary | ICD-10-CM

## 2018-01-04 DIAGNOSIS — N489 Disorder of penis, unspecified: Secondary | ICD-10-CM | POA: Diagnosis present

## 2018-01-04 LAB — URINALYSIS, ROUTINE W REFLEX MICROSCOPIC
Bilirubin Urine: NEGATIVE
Glucose, UA: NEGATIVE mg/dL
KETONES UR: NEGATIVE mg/dL
Nitrite: NEGATIVE
PH: 5 (ref 5.0–8.0)
Protein, ur: NEGATIVE mg/dL
Specific Gravity, Urine: 1.015 (ref 1.005–1.030)

## 2018-01-04 MED ORDER — PENICILLIN G BENZATHINE 1200000 UNIT/2ML IM SUSP
2.4000 10*6.[IU] | Freq: Once | INTRAMUSCULAR | Status: AC
  Administered 2018-01-04: 2.4 10*6.[IU] via INTRAMUSCULAR
  Filled 2018-01-04: qty 4

## 2018-01-04 MED ORDER — AZITHROMYCIN 250 MG PO TABS
1000.0000 mg | ORAL_TABLET | Freq: Once | ORAL | Status: AC
  Administered 2018-01-04: 1000 mg via ORAL
  Filled 2018-01-04: qty 4

## 2018-01-04 MED ORDER — CEFTRIAXONE SODIUM 250 MG IJ SOLR
250.0000 mg | Freq: Once | INTRAMUSCULAR | Status: AC
  Administered 2018-01-04: 250 mg via INTRAMUSCULAR
  Filled 2018-01-04: qty 250

## 2018-01-04 MED ORDER — METRONIDAZOLE 500 MG PO TABS
2000.0000 mg | ORAL_TABLET | Freq: Once | ORAL | Status: AC
  Administered 2018-01-04: 2000 mg via ORAL
  Filled 2018-01-04: qty 4

## 2018-01-04 NOTE — ED Provider Notes (Signed)
[facility] EMERGENCY DEPARTMENT Provider Note   CSN: [id number] Arrival date & time: 01/04/18  1043     History   Chief Complaint No chief complaint on file.   HPI James Stevens is a 57 y.o. male.  57 y/o male with a PMH of Arthritis, Bipolar affective, chronic back pain presents to the ED with a chief complaint of "I think I have syphilis, I goggled my symptoms on my phone" since Saturday. He reports his last sexual exposure was a month ago. He also reports tenderness to the ulcer on top of his penis along with some clear discharge. He reports the "bump" on his penis has now a clear discharge. He has not tried any therapy for relieve. He denies any fever, dysuria, n/v/d or rashes.      Past Medical History:  Diagnosis Date  . Arthritis   . Bipolar affective (HCC)   . Chronic back pain    and right hip  . Depression   . GERD (gastroesophageal reflux disease)    occ tums  . Headache(784.0)    migraines  . Hip pain   . 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 Peripheral vascular disease (HCC)   . Schizophrenia [facility](HCC)     Patient Active Problem List   Diagnosis Date Noted  . History of bilateral hip replacements 07/03/2017  . Bilateral hip pain 07/03/2017  . Lumbar herniated disc 03/23/2016  . 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 HNP (herniated nucleus pulposus), lumbar 05/05/2014    Class: Chronic  . PVD (peripheral vascular disease) (HCC) 10/30/2013  . Aortoiliac occlusive disease (HCC) 10/07/2013  . Atherosclerosis of native arteries of the extremities with intermittent claudication 09/05/2013  . Peripheral vascular disease, unspecified (HCC) 08/29/2013  . Avascular necrosis of hip (HCC) 04/17/2012    Past Surgical History:  Procedure Laterality Date  . AORTA - BILATERAL FEMORAL ARTERY BYPASS GRAFT N/A 10/07/2013   Procedure: AORTA BIFEMORAL BYPASS GRAFT;  Surgeon: Sherren Kernsharles E Fields, MD;  [facility];  Service: Vascular;  Laterality: N/A;  . ESOPHAGEAL DILATION    . ESOPHAGOGASTRODUODENOSCOPY  09/06/2011   Procedure: ESOPHAGOGASTRODUODENOSCOPY (EGD);  Surgeon: Vertell NovakJames L Edwards Jr., MD;  [facility];  Service: Endoscopy;  Laterality: N/A;  . FRACTURE SURGERY     left fingers # 4,5  . JOINT REPLACEMENT Bilateral    rt hip replacement, and  LT hip  . LUMBAR LAMINECTOMY N/A 05/05/2014   Procedure: BILATERAL LUMBAR LAMINECTOMY L4-5 AND L5-S1;  Surgeon: Kerrin ChampagneJames E Nitka, MD;  Location: [facility] OR;  Service: Orthopedics;  Laterality: N/A;  . MANDIBLE FRACTURE SURGERY    . SAVORY DILATION  09/06/2011   Procedure: SAVORY DILATION;  Surgeon: Vertell NovakJames L Edwards Jr., MD;  [facility];  Service: Endoscopy;  Laterality: N/A;  . TOTAL HIP ARTHROPLASTY Left 04/17/2012   Procedure: LEFT TOTAL HIP ARTHROPLASTY ANTERIOR APPROACH;  Surgeon: Kathryne Hitchhristopher Y Blackman, MD;  Location: [facility] OR;  Service: Orthopedics;  Laterality: Left;        Home Medications    Prior to Admission medications   Medication Sig Start Date End Date Taking? Authorizing Provider  lidocaine (LIDODERM) 5 % Place 1 patch onto the skin daily. Remove & Discard patch within 12 hours or as directed by MD 07/15/17   Gerhard MunchLockwood, Robert, MD  oxyCODONE-acetaminophen (PERCOCET/ROXICET) 5-325 MG tablet Take 1 tablet by mouth every 6 (  six) hours as needed for severe pain. 07/15/17   Gerhard Munch, MD  predniSONE (DELTASONE) 20 MG tablet Take 2 tablets (40 mg total) by mouth daily with breakfast. For the next four days 07/15/17   Gerhard Munch, MD    Family History Family History  Problem Relation Age of Onset  . Diabetes Mother   . Diabetes Father   . Diabetes Other   . Diabetes Brother     Social History Social History   Tobacco Use  . Smoking status: Current Every Day Smoker    Packs/day: 0.20    Years: 34.00    Pack years: 6.80     Types: Cigarettes  . Smokeless tobacco: Never Used  . Tobacco comment: 3-4 cigarettes per day  Substance Use Topics  . Alcohol use: Yes    Comment: once a week   . Drug use: Yes    Frequency: 2.0 times per week    Types: Cocaine, Marijuana     Allergies   Ibuprofen and Tramadol   Review of Systems Review of Systems  Constitutional: Negative for fever.  Genitourinary: Positive for genital sores. Negative for discharge, hematuria, penile swelling, scrotal swelling and testicular pain.     Physical Exam Updated Vital Signs BP (!) 122/92 (BP Location: Right Arm)   Pulse 88   Temp 98.2 F (36.8 C) (Oral)   Resp 16   Ht 5\' 9"  (1.753 m)   Wt 64.9 kg   SpO2 98%   BMI 21.12 kg/m   Physical Exam Vitals signs and nursing note reviewed. Exam conducted with a chaperone present.  Constitutional:      Appearance: He is well-developed.  HENT:     Head: Normocephalic and atraumatic.  Eyes:     General: No scleral icterus.    Pupils: Pupils are equal, round, and reactive to light.  Neck:     Musculoskeletal: Normal range of motion.  Cardiovascular:     Heart sounds: Normal heart sounds.  Pulmonary:     Effort: Pulmonary effort is normal.     Breath sounds: Normal breath sounds. No wheezing.  Chest:     Chest wall: No tenderness.  Abdominal:     General: Bowel sounds are normal. There is no distension.     Palpations: Abdomen is soft.     Tenderness: There is no abdominal tenderness.  Genitourinary:    Penis: Uncircumcised. Lesions present.        Comments: Isolated Ulcerated lesion, non tender, clear drainage from the wound. No vesicles note Musculoskeletal:        General: No tenderness or deformity.  Skin:    General: Skin is warm and dry.     Comments: No ashes in his palms or soles of his feet.  Neurological:     Mental Status: He is alert and oriented to person, place, and time.      ED Treatments / Results  Labs (all labs ordered are listed, but only  abnormal results are displayed) Labs Reviewed  URINALYSIS, ROUTINE W REFLEX MICROSCOPIC - Abnormal; Notable for the following components:      Result Value   Hgb urine dipstick SMALL (*)    Leukocytes, UA TRACE (*)    Bacteria, UA RARE (*)    All other components within normal limits  RPR  HERPES SIMPLEX VIRUS(HSV) DNA BY PCR  GC/CHLAMYDIA PROBE AMP ([place]) NOT AT [facility]    EKG None  Radiology No results found.  Procedures Procedures (including critical care  time)  Medications Ordered in ED Medications  penicillin g benzathine (BICILLIN LA) 1200000 UNIT/2ML injection 2.4 Million Units (has no administration in time range)  cefTRIAXone (ROCEPHIN) injection 250 mg (has no administration in time range)  azithromycin (ZITHROMAX) tablet 1,000 mg (has no administration in time range)  metroNIDAZOLE (FLAGYL) tablet 2,000 mg (has no administration in time range)     Initial Impression / Assessment and Plan / ED Course  I have reviewed the triage vital signs and the nursing notes.  Pertinent labs & imaging results that were available during my care of the patient were reviewed by me and considered in my medical decision making (see chart for details).     Presents with recent isolated lesion on his penis which presented a few days after sexual intercourse with a new partner.  He reports there is slight drainage from this wound along with it increasing in size along the glans of the penis.  Denies any fever, chills, rashes in his palms or soles or altered mental status.  He does have a previous history of trichomonas.  Patient swabbed for syphilis, gonorrhea and chlamydia, we will treat him empirically for all of these along with trichomonas.  Low suspicion for any herpes as lesions are not vesicular and there is 1 present which is isolated and ulcerated looking.  His vitals have been stable during visit, he is well-appearing, he understands and agrees with management.  He will be  discharged home with recommendations to use the health department for his future STI testing.  Patient agrees and understands the management, precautions provided.  Final Clinical Impressions(s) / ED Diagnoses   Final diagnoses:  STD exposure    ED Discharge Orders    None       Claude MangesSoto, Gilberte Gorley, PA-C 01/04/18 1304    Melene PlanFloyd, Dan, DO 01/04/18 1333

## 2018-01-04 NOTE — Discharge Instructions (Signed)
Today you were treated for gonorrhea, chlamydia, syphilis, and trichomonas please refrain from sexual intercourse for the next 10 days. For future sexual infection testing please refer to the [facility], I have provided the address below.

## 2018-01-04 NOTE — ED Triage Notes (Signed)
Pt reports Saturday he started having a "funny feeling" to his penis. Sunday pt reports a bump to the tip of his penis.

## 2018-01-04 NOTE — ED Notes (Signed)
Pt stable, states understanding of discharge instructions 

## 2018-01-05 LAB — GC/CHLAMYDIA PROBE AMP (~~LOC~~) NOT AT ARMC
Chlamydia: NEGATIVE
Neisseria Gonorrhea: NEGATIVE

## 2018-01-05 LAB — RPR: RPR: NONREACTIVE

## 2018-01-06 LAB — HERPES SIMPLEX VIRUS(HSV) DNA BY PCR
HSV 1 DNA: NEGATIVE
HSV 2 DNA: POSITIVE — AB

## 2018-02-05 ENCOUNTER — Encounter (HOSPITAL_COMMUNITY): Payer: Self-pay

## 2018-02-05 ENCOUNTER — Emergency Department (HOSPITAL_COMMUNITY)
Admission: EM | Admit: 2018-02-05 | Discharge: 2018-02-05 | Disposition: A | Discharge status: Home / Self Care | Payer: Medicaid Other | Attending: Emergency Medicine | Admitting: Emergency Medicine

## 2018-02-05 DIAGNOSIS — F17210 Nicotine dependence, cigarettes, uncomplicated: Secondary | ICD-10-CM | POA: Diagnosis not present

## 2018-02-05 DIAGNOSIS — Z79899 Other long term (current) drug therapy: Secondary | ICD-10-CM | POA: Diagnosis not present

## 2018-02-05 DIAGNOSIS — L03111 Cellulitis of right axilla: Secondary | ICD-10-CM | POA: Insufficient documentation

## 2018-02-05 DIAGNOSIS — L02411 Cutaneous abscess of right axilla: Secondary | ICD-10-CM

## 2018-02-05 MED ORDER — LIDOCAINE-EPINEPHRINE (PF) 2 %-1:200000 IJ SOLN
10.0000 mL | Freq: Once | INTRAMUSCULAR | Status: AC
  Administered 2018-02-05: 10 mL
  Filled 2018-02-05: qty 20

## 2018-02-05 MED ORDER — DOXYCYCLINE HYCLATE 100 MG PO CAPS: 100.0000 mg | ORAL_CAPSULE | Freq: Two times a day (BID) | ORAL | 0 refills | Status: DC

## 2018-02-05 NOTE — Discharge Instructions (Signed)
Complete entire course of antibiotics as directed.  Keep area covered with dressing as there will likely be more drainage I would also like for you to do warm soaks in the shower at least twice daily, monitor the area for signs of worsening infection such as redness, swelling, increasing pain or drainage or any other new or concerning symptoms if these occur please return to the emergency department or follow-up with your regular doctor.

## 2018-02-05 NOTE — ED Triage Notes (Signed)
Pt endorses abscess to right axillary area the size of a quarter, no drainage. VSS.

## 2018-02-05 NOTE — ED Provider Notes (Signed)
[facility] EMERGENCY DEPARTMENT Provider Note   CSN: [id number] Arrival date & time: 02/05/18  1326     History   Chief Complaint Chief Complaint  Patient presents with  . Abscess    HPI James Stevens is a 58 y.o. male.  James Stevens is a 58 y.o. male with a history of migraines, GERD, chronic back pain and schizophrenia, presents to the emergency department for evaluation of a right axillary abscess.  He reports the area started as a small bump in his right axilla but over the past 2 weeks has become increasingly large and painful.  He reports it is worse with movement and palpation and he reports is very uncomfortable to put his arm down at his side because it puts pressure on this area.  He has not noted any drainage from the area.  No surrounding erythema.  No fevers or chills, no nausea or vomiting.  Denies history of previous abscesses.  No other aggravating or alleviating factors.  He has not done anything to treat this prior to arrival.     Past Medical History:  Diagnosis Date  . Arthritis   . Bipolar affective (HCC)   . Chronic back pain    and right hip  . Depression   . GERD (gastroesophageal reflux disease)    occ tums  . Headache(784.0)    migraines  . Hip pain   . 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 Peripheral vascular disease (HCC)   . Schizophrenia [facility])     Patient Active Problem List   Diagnosis Date Noted  . History of bilateral hip replacements 07/03/2017  . Bilateral hip pain 07/03/2017  . Lumbar herniated disc 03/23/2016  . 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 HNP (herniated nucleus pulposus), lumbar 05/05/2014    Class: Chronic  . PVD (peripheral vascular disease) (HCC) 10/30/2013  . Aortoiliac occlusive disease (HCC) 10/07/2013  . Atherosclerosis of native arteries of the extremities with  intermittent claudication 09/05/2013  . Peripheral vascular disease, unspecified (HCC) 08/29/2013  . Avascular necrosis of hip (HCC) 04/17/2012    Past Surgical History:  Procedure Laterality Date  . AORTA - BILATERAL FEMORAL ARTERY BYPASS GRAFT N/A 10/07/2013   Procedure: AORTA BIFEMORAL BYPASS GRAFT;  Surgeon: Sherren Kerns, MD;  Location: [facility] OR;  Service: Vascular;  Laterality: N/A;  . ESOPHAGEAL DILATION    . ESOPHAGOGASTRODUODENOSCOPY  09/06/2011   Procedure: ESOPHAGOGASTRODUODENOSCOPY (EGD);  Surgeon: Vertell Novak., MD;  Location: [facility]COPY;  Service: Endoscopy;  Laterality: N/A;  . FRACTURE SURGERY     left fingers # 4,5  . JOINT REPLACEMENT Bilateral    rt hip replacement, and  LT hip  . LUMBAR LAMINECTOMY N/A 05/05/2014   Procedure: BILATERAL LUMBAR LAMINECTOMY L4-5 AND L5-S1;  Surgeon: Kerrin Champagne, MD;  Location: [facility] OR;  Service: Orthopedics;  Laterality: N/A;  . MANDIBLE FRACTURE SURGERY    . SAVORY DILATION  09/06/2011   Procedure: SAVORY DILATION;  Surgeon: Vertell Novak., MD;  Location: [facility]COPY;  Service: Endoscopy;  Laterality: N/A;  . TOTAL HIP ARTHROPLASTY Left 04/17/2012   Procedure: LEFT TOTAL HIP ARTHROPLASTY ANTERIOR APPROACH;  Surgeon: Kathryne Hitch, MD;  Location: [facility] OR;  Service: Orthopedics;  Laterality: Left;        Home Medications    Prior to Admission medications   Medication Sig  Start Date End Date Taking? Authorizing Provider  doxycycline (VIBRAMYCIN) 100 MG capsule Take 1 capsule (100 mg total) by mouth 2 (two) times daily. One po bid x 7 days 02/05/18   Dartha LodgeFord, Oz Gammel N, PA-C  lidocaine (LIDODERM) 5 % Place 1 patch onto the skin daily. Remove & Discard patch within 12 hours or as directed by MD 07/15/17   Gerhard MunchLockwood, Robert, MD  oxyCODONE-acetaminophen (PERCOCET/ROXICET) 5-325 MG tablet Take 1 tablet by mouth every 6 (six) hours as needed for severe pain. 07/15/17   Gerhard MunchLockwood, Robert, MD  predniSONE (DELTASONE) 20 MG tablet Take 2  tablets (40 mg total) by mouth daily with breakfast. For the next four days 07/15/17   Gerhard MunchLockwood, Robert, MD    Family History Family History  Problem Relation Age of Onset  . Diabetes Mother   . Diabetes Father   . Diabetes Other   . Diabetes Brother     Social History Social History   Tobacco Use  . Smoking status: Current Every Day Smoker    Packs/day: 0.20    Years: 34.00    Pack years: 6.80    Types: Cigarettes  . Smokeless tobacco: Never Used  . Tobacco comment: 3-4 cigarettes per day  Substance Use Topics  . Alcohol use: Yes    Comment: once a week   . Drug use: Yes    Frequency: 2.0 times per week    Types: Cocaine, Marijuana     Allergies   Ibuprofen and Tramadol   Review of Systems Review of Systems  Constitutional: Negative for chills and fever.  Gastrointestinal: Negative for nausea and vomiting.  Musculoskeletal: Negative for arthralgias and myalgias.  Skin: Positive for wound (Abscess). Negative for color change.  Neurological: Negative for weakness and numbness.     Physical Exam Updated Vital Signs BP (!) 135/105 (BP Location: Right Arm)   Pulse 80   Temp 98.1 F (36.7 C) (Oral)   Resp 16   SpO2 100%   Physical Exam Vitals signs and nursing note reviewed.  Constitutional:      General: He is not in acute distress.    Appearance: He is well-developed. He is not diaphoretic.  HENT:     Head: Normocephalic and atraumatic.  Eyes:     General:        Right eye: No discharge.        Left eye: No discharge.  Pulmonary:     Effort: Pulmonary effort is normal. No respiratory distress.  Musculoskeletal:     Comments: Right axilla with 1 cm x 1 cm area of fluctuance, no overlying erythema or surrounding induration.  No palpable lymphadenopathy, no expressible drainage.  Skin:    General: Skin is warm and dry.  Neurological:     Mental Status: He is alert.     Coordination: Coordination normal.  Psychiatric:        Mood and Affect: Mood  normal.        Behavior: Behavior normal.      ED Treatments / Results  Labs (all labs ordered are listed, but only abnormal results are displayed) Labs Reviewed - No data to display  EKG None  Radiology No results found.  Procedures Ultrasound ED Soft Tissue Date/Time: 02/05/2018 5:28 PM Performed by: Dartha LodgeFord, Kimberely Mccannon N, PA-C Authorized by: Dartha LodgeFord, Khale Nigh N, PA-C   Procedure details:    Indications: localization of abscess     Transverse view:  Visualized   Longitudinal view:  Visualized   Images: archived  Location:    Location: axilla     Side:  Right Findings:     abscess present Comments:     Small 1 x 1 cm fluid collection in the superficial right axilla .Marland KitchenIncision and Drainage Date/Time: 02/05/2018 5:29 PM Performed by: Dartha Lodge, PA-C Authorized by: Dartha Lodge, PA-C   Consent:    Consent obtained:  Verbal   Consent given by:  Patient   Risks discussed:  Bleeding, incomplete drainage, pain, infection and damage to other organs   Alternatives discussed:  No treatment Location:    Type:  Abscess   Size:  1 x 1 cm   Location:  Upper extremity   Upper extremity location: Right axilla. Pre-procedure details:    Skin preparation:  Antiseptic wash Anesthesia (see MAR for exact dosages):    Anesthesia method:  Local infiltration   Local anesthetic:  Lidocaine 2% WITH epi Procedure type:    Complexity:  Simple Procedure details:    Incision types:  Single straight   Incision depth:  Dermal   Scalpel blade:  11   Wound management:  Probed and deloculated and irrigated with saline   Drainage:  Purulent   Drainage amount:  Moderate   Wound treatment:  Wound left open   Packing materials:  None Post-procedure details:    Patient tolerance of procedure:  Tolerated well, no immediate complications   (including critical care time)  Medications Ordered in ED Medications  lidocaine-EPINEPHrine (XYLOCAINE W/EPI) 2 %-1:200000 (PF) injection 10 mL (has no  administration in time range)     Initial Impression / Assessment and Plan / ED Course  I have reviewed the triage vital signs and the nursing notes.  Pertinent labs & imaging results that were available during my care of the patient were reviewed by me and considered in my medical decision making (see chart for details).  Patient with skin abscess amenable to incision and drainage.  Abscess was not large enough to warrant packing or drain,  wound recheck in 2 days. Encouraged home warm soaks and flushing.  Will prescribe doxycycline for surrounding cellulitis.  Discussed appropriate wound care and return precautions with patient.  He expresses understanding and agreement with plan.  Discharged home in good condition.  Final Clinical Impressions(s) / ED Diagnoses   Final diagnoses:  Abscess of axilla, right    ED Discharge Orders         Ordered    doxycycline (VIBRAMYCIN) 100 MG capsule  2 times daily     02/05/18 1727           Dartha Lodge, [place] 02/05/18 1818    Melene Plan, DO 02/05/18 1907

## 2018-02-05 NOTE — ED Notes (Signed)
Pt verbalized understanding of d/c instructions and has no further questions, VSS, NAD.  

## 2018-02-06 ENCOUNTER — Emergency Department (HOSPITAL_COMMUNITY): Payer: Medicaid Other

## 2018-02-06 ENCOUNTER — Emergency Department (HOSPITAL_COMMUNITY)
Admission: EM | Admit: 2018-02-06 | Discharge: 2018-02-06 | Disposition: A | Discharge status: Home / Self Care | Payer: Medicaid Other | Attending: Emergency Medicine | Admitting: Emergency Medicine

## 2018-02-06 ENCOUNTER — Encounter (HOSPITAL_COMMUNITY): Payer: Self-pay | Admitting: Emergency Medicine

## 2018-02-06 ENCOUNTER — Other Ambulatory Visit: Payer: Self-pay

## 2018-02-06 DIAGNOSIS — Z96643 Presence of artificial hip joint, bilateral: Secondary | ICD-10-CM | POA: Diagnosis not present

## 2018-02-06 DIAGNOSIS — R1084 Generalized abdominal pain: Secondary | ICD-10-CM | POA: Diagnosis not present

## 2018-02-06 DIAGNOSIS — Z79899 Other long term (current) drug therapy: Secondary | ICD-10-CM | POA: Insufficient documentation

## 2018-02-06 DIAGNOSIS — R112 Nausea with vomiting, unspecified: Secondary | ICD-10-CM

## 2018-02-06 DIAGNOSIS — R197 Diarrhea, unspecified: Secondary | ICD-10-CM | POA: Insufficient documentation

## 2018-02-06 DIAGNOSIS — F17210 Nicotine dependence, cigarettes, uncomplicated: Secondary | ICD-10-CM | POA: Diagnosis not present

## 2018-02-06 LAB — URINALYSIS, ROUTINE W REFLEX MICROSCOPIC
Bilirubin Urine: NEGATIVE
Glucose, UA: NEGATIVE mg/dL
Ketones, ur: NEGATIVE mg/dL
Leukocytes, UA: NEGATIVE
Nitrite: NEGATIVE
Protein, ur: NEGATIVE mg/dL
Specific Gravity, Urine: >1.046 — ABNORMAL HIGH (ref 1.005–1.030)
pH: 5 (ref 5.0–8.0)

## 2018-02-06 LAB — COMPREHENSIVE METABOLIC PANEL
ALBUMIN: 4.1 g/dL (ref 3.5–5.0)
ALT: 18 U/L (ref 0–44)
AST: 23 U/L (ref 15–41)
Alkaline Phosphatase: 56 U/L (ref 38–126)
Anion gap: 10 (ref 5–15)
BUN: 11 mg/dL (ref 6–20)
CO2: 24 mmol/L (ref 22–32)
Calcium: 9.8 mg/dL (ref 8.9–10.3)
Chloride: 106 mmol/L (ref 98–111)
Creatinine, Ser: 1.43 mg/dL — ABNORMAL HIGH (ref 0.61–1.24)
GFR calc Af Amer: >60 mL/min (ref >60)
GFR calc non Af Amer: 54 mL/min — ABNORMAL LOW (ref >60)
GLUCOSE: 108 mg/dL — AB (ref 70–99)
Potassium: 4.9 mmol/L (ref 3.5–5.1)
Sodium: 140 mmol/L (ref 135–145)
Total Bilirubin: 0.7 mg/dL (ref 0.3–1.2)
Total Protein: 7.8 g/dL (ref 6.5–8.1)

## 2018-02-06 LAB — CBC
HEMATOCRIT: 52.3 % — AB (ref 39.0–52.0)
Hemoglobin: 16.9 g/dL (ref 13.0–17.0)
MCH: 31.8 pg (ref 26.0–34.0)
MCHC: 32.3 g/dL (ref 30.0–36.0)
MCV: 98.5 fL (ref 80.0–100.0)
Platelets: 164 10*3/uL (ref 150–400)
RBC: 5.31 MIL/uL (ref 4.22–5.81)
RDW: 14 % (ref 11.5–15.5)
WBC: 9.6 10*3/uL (ref 4.0–10.5)
nRBC: 0 % (ref 0.0–0.2)

## 2018-02-06 LAB — LIPASE, BLOOD: Lipase: 23 U/L (ref 11–51)

## 2018-02-06 MED ORDER — IOHEXOL 300 MG/ML  SOLN
100.0000 mL | Freq: Once | INTRAMUSCULAR | Status: AC | PRN
  Administered 2018-02-06: 100 mL via INTRAVENOUS

## 2018-02-06 MED ORDER — SODIUM CHLORIDE 0.9% FLUSH: 3.0000 mL | Freq: Once | INTRAVENOUS | Status: DC

## 2018-02-06 MED ORDER — HYDROMORPHONE HCL 1 MG/ML IJ SOLN
1.0000 mg | Freq: Once | INTRAMUSCULAR | Status: AC
  Administered 2018-02-06: 1 mg via INTRAVENOUS
  Filled 2018-02-06: qty 1

## 2018-02-06 MED ORDER — SODIUM CHLORIDE 0.9 % IV BOLUS
1000.0000 mL | Freq: Once | INTRAVENOUS | Status: AC
  Administered 2018-02-06: 1000 mL via INTRAVENOUS

## 2018-02-06 MED ORDER — ONDANSETRON HCL 4 MG/2ML IJ SOLN
4.0000 mg | Freq: Once | INTRAMUSCULAR | Status: AC
  Administered 2018-02-06: 4 mg via INTRAVENOUS
  Filled 2018-02-06: qty 2

## 2018-02-06 MED ORDER — ONDANSETRON 4 MG PO TBDP: 4.0000 mg | ORAL_TABLET | Freq: Three times a day (TID) | ORAL | 0 refills | Status: DC | PRN

## 2018-02-06 NOTE — Discharge Instructions (Addendum)
Clear liquid diet, advance to bland diet as tolerated. Take Zofran as needed as prescribed for nausea and vomiting. Check with your doctor this week.  Return to ER for new or worsening symptoms.

## 2018-02-06 NOTE — ED Provider Notes (Signed)
[facility] Provider Note   CSN: [id number] Arrival date & time: 02/06/18  1516     History   Chief Complaint Chief Complaint  Patient presents with  . Abdominal Pain    HPI James Stevens is a 58 y.o. male.  58 year old male presents with complaint of abdominal pain with nausea and vomiting.  Patient states that his symptoms started last night after he got home from the hospital having an abscess in his right axillary area I&D.  Patient has not started taking his antibiotics.  Reports diffuse abdominal pain, constant, progressively worsening, sharp in nature, worse with deep breaths and movement.  Denies blood in emesis.  Patient reports feeling feverish with chills. Previous abdominal surgeries include aorta bifemoral bypass in 2015. No other complaints or concerns.      Past Medical History:  Diagnosis Date  . Arthritis   . Bipolar affective (HCC)   . Chronic back pain    and right hip  . Depression   . GERD (gastroesophageal reflux disease)    occ tums  . Headache(784.0)    migraines  . Hip pain   . 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 Peripheral vascular disease (HCC)   . Schizophrenia [facility](HCC)     Patient Active Problem List   Diagnosis Date Noted  . History of bilateral hip replacements 07/03/2017  . Bilateral hip pain 07/03/2017  . Lumbar herniated disc 03/23/2016  . 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 HNP (herniated nucleus pulposus), lumbar 05/05/2014    Class: Chronic  . PVD (peripheral vascular disease) (HCC) 10/30/2013  . Aortoiliac occlusive disease (HCC) 10/07/2013  . Atherosclerosis of native arteries of the extremities with intermittent claudication 09/05/2013  . Peripheral vascular disease, unspecified (HCC) 08/29/2013  . Avascular necrosis of hip (HCC) 04/17/2012    Past Surgical  History:  Procedure Laterality Date  . AORTA - BILATERAL FEMORAL ARTERY BYPASS GRAFT N/A 10/07/2013   Procedure: AORTA BIFEMORAL BYPASS GRAFT;  Surgeon: Sherren Kernsharles E Fields, MD;  [facility];  Service: Vascular;  Laterality: N/A;  . ESOPHAGEAL DILATION    . ESOPHAGOGASTRODUODENOSCOPY  09/06/2011   Procedure: ESOPHAGOGASTRODUODENOSCOPY (EGD);  Surgeon: Vertell NovakJames L Edwards Jr., MD;  [facility];  Service: Endoscopy;  Laterality: N/A;  . FRACTURE SURGERY     left fingers # 4,5  . JOINT REPLACEMENT Bilateral    rt hip replacement, and  LT hip  . LUMBAR LAMINECTOMY N/A 05/05/2014   Procedure: BILATERAL LUMBAR LAMINECTOMY L4-5 AND L5-S1;  Surgeon: Kerrin ChampagneJames E Nitka, MD;  Location: [facility] OR;  Service: Orthopedics;  Laterality: N/A;  . MANDIBLE FRACTURE SURGERY    . SAVORY DILATION  09/06/2011   Procedure: SAVORY DILATION;  Surgeon: Vertell NovakJames L Edwards Jr., MD;  [facility];  Service: Endoscopy;  Laterality: N/A;  . TOTAL HIP ARTHROPLASTY Left 04/17/2012   Procedure: LEFT TOTAL HIP ARTHROPLASTY ANTERIOR APPROACH;  Surgeon: Kathryne Hitchhristopher Y Blackman, MD;  Location: [facility] OR;  Service: Orthopedics;  Laterality: Left;        Home Medications    Prior to Admission medications   Medication Sig Start Date End Date Taking? Authorizing Provider  doxycycline (VIBRAMYCIN) 100 MG capsule Take 1 capsule (100 mg total) by mouth 2 (two) times daily. One po bid x 7 days 02/05/18   Dartha LodgeFord, Kelsey N, PA-C  lidocaine (LIDODERM) 5 % Place 1 patch  onto the skin daily. Remove & Discard patch within 12 hours or as directed by MD 07/15/17   Gerhard Munch, MD  ondansetron (ZOFRAN ODT) 4 MG disintegrating tablet Take 1 tablet (4 mg total) by mouth every 8 (eight) hours as needed for up to 12 doses for nausea or vomiting. 02/06/18   Jeannie Fend, PA-C  oxyCODONE-acetaminophen (PERCOCET/ROXICET) 5-325 MG tablet Take 1 tablet by mouth every 6 (six) hours as needed for severe pain. 07/15/17   Gerhard Munch, MD  predniSONE  (DELTASONE) 20 MG tablet Take 2 tablets (40 mg total) by mouth daily with breakfast. For the next four days 07/15/17   Gerhard Munch, MD    Family History Family History  Problem Relation Age of Onset  . Diabetes Mother   . Diabetes Father   . Diabetes Other   . Diabetes Brother     Social History Social History   Tobacco Use  . Smoking status: Current Every Day Smoker    Packs/day: 0.20    Years: 34.00    Pack years: 6.80    Types: Cigarettes  . Smokeless tobacco: Never Used  . Tobacco comment: 3-4 cigarettes per day  Substance Use Topics  . Alcohol use: Yes    Comment: once a week   . Drug use: Yes    Frequency: 2.0 times per week    Types: Cocaine, Marijuana     Allergies   Ibuprofen and Tramadol   Review of Systems Review of Systems  Constitutional: Positive for chills.  Respiratory: Negative for shortness of breath.   Cardiovascular: Negative for chest pain.  Gastrointestinal: Positive for abdominal pain, diarrhea, nausea and vomiting. Negative for blood in stool and constipation.  Genitourinary: Positive for frequency. Negative for dysuria and hematuria.  Skin: Negative for rash and wound.  Allergic/Immunologic: Negative for immunocompromised state.  Neurological: Negative for dizziness and weakness.  Hematological: Does not bruise/bleed easily.  Psychiatric/Behavioral: Negative for confusion.  All other systems reviewed and are negative.    Physical Exam Updated Vital Signs BP 133/83   Pulse 87   Resp (!) 21   SpO2 93%   Physical Exam Vitals signs and nursing note reviewed.  Constitutional:      General: He is not in acute distress.    Appearance: He is well-developed. He is not diaphoretic.  HENT:     Head: Normocephalic and atraumatic.     Mouth/Throat:     Mouth: Mucous membranes are moist.     Pharynx: Oropharynx is clear.  Cardiovascular:     Rate and Rhythm: Normal rate and regular rhythm.     Heart sounds: Normal heart sounds. No  murmur.  Pulmonary:     Effort: Pulmonary effort is normal.     Breath sounds: Normal breath sounds.  Abdominal:     General: Abdomen is flat.     Tenderness: There is generalized abdominal tenderness. There is guarding.     Comments: Well healed midline surgical scar  Skin:    General: Skin is warm and dry.     Findings: No rash.  Neurological:     Mental Status: He is alert and oriented to person, place, and time.  Psychiatric:        Behavior: Behavior normal.      ED Treatments / Results  Labs (all labs ordered are listed, but only abnormal results are displayed) Labs Reviewed  COMPREHENSIVE METABOLIC PANEL - Abnormal; Notable for the following components:      Result Value  Glucose, Bld 108 (*)    Creatinine, Ser 1.43 (*)    GFR calc non Af Amer 54 (*)    All other components within normal limits  CBC - Abnormal; Notable for the following components:   HCT 52.3 (*)    All other components within normal limits  LIPASE, BLOOD  URINALYSIS, ROUTINE W REFLEX MICROSCOPIC    EKG None  Radiology Ct Abdomen Pelvis W Contrast  Result Date: 02/06/2018 CLINICAL DATA:  Generalized abdominal pain with nausea vomiting and diarrhea. EXAM: CT ABDOMEN AND PELVIS WITH CONTRAST TECHNIQUE: Multidetector CT imaging of the abdomen and pelvis was performed using the standard protocol following bolus administration of intravenous contrast. CONTRAST:  OMNIPAQUE IOHEXOL 300 MG/ML  SOLN COMPARISON:  11/04/2015. FINDINGS: Lower chest: Emphysema noted in the lung bases. Hepatobiliary: Tiny hypoattenuating liver lesions are too small to characterize but likely cysts. There is no evidence for gallstones, gallbladder wall thickening, or pericholecystic fluid. No intrahepatic or extrahepatic biliary dilation. Pancreas: No focal mass lesion. No dilatation of the main duct. No intraparenchymal cyst. No peripancreatic edema. Spleen: No splenomegaly. No focal mass lesion. Adrenals/Urinary Tract: No  adrenal nodule or mass. Left kidney unremarkable. Stable 17 mm right renal cyst. No evidence for hydroureter. Distal ureters and bladder are obscured by beam hardening artifact from bilateral hip replacement. Stomach/Bowel: Stomach is unremarkable. No gastric wall thickening. No evidence of outlet obstruction. Duodenum is normally positioned as is the ligament of Treitz. Small bowel loops are diffusely fluid-filled and mildly distended. Terminal ileum best seen on coronal images and unremarkable. The appendix is normal. Colon is diffusely fluid-filled to the level the rectum, compatible with reported clinical history of diarrhea. Vascular/Lymphatic: There is abdominal aortic atherosclerosis without aneurysm. Patient is status post aorto bi femoral bypass grafting. There is no gastrohepatic or hepatoduodenal ligament lymphadenopathy. No intraperitoneal or retroperitoneal lymphadenopathy. Pelvic sidewalls obscured by artifact. No evidence for lymphadenopathy in the groin regions. Reproductive: Prostate gland obscured. Other: No substantial intraperitoneal free fluid. Musculoskeletal: Status post lumbar fusion. Status post bilateral hip replacement. IMPRESSION: Small bowel and colon are mildly distended and diffusely fluid-filled with fluid visible distally to the level of the anus. No associated bowel wall thickening to suggest overt enteritis or colitis. Imaging features are compatible with the reported history of diarrhea. Aortic Atherosclerois (ICD10-170.0) Electronically Signed   By: Kennith Center M.D.   On: 02/06/2018 18:29    Procedures Procedures (including critical care time)  Medications Ordered in ED Medications  sodium chloride flush (NS) 0.9 % injection 3 mL (3 mLs Intravenous Not Given 02/06/18 1912)  sodium chloride 0.9 % bolus 1,000 mL (1,000 mLs Intravenous New Bag/Given 02/06/18 1907)  HYDROmorphone (DILAUDID) injection 1 mg (1 mg Intravenous Given 02/06/18 1906)  ondansetron (ZOFRAN)  injection 4 mg (4 mg Intravenous Given 02/06/18 1906)  iohexol (OMNIPAQUE) 300 MG/ML solution 100 mL (100 mLs Intravenous Contrast Given 02/06/18 1804)     Initial Impression / Assessment and Plan / ED Course  I have reviewed the triage vital signs and the nursing notes.  Pertinent labs & imaging results that were available during my care of the patient were reviewed by me and considered in my medical decision making (see chart for details).  Clinical Course as of Feb 06 2053  Tue Feb 06, 2018  [id number] 58 year old male presents with complaint of generalized abdominal pain with nausea, vomiting, diarrhea.  Symptoms started yesterday.  On exam patient has diffuse abdominal tenderness.  Vital signs reviewed, patient mildly tachypneic  while wearing a mask however no labored breathing.  Oral temperature is 97.9 on exam.  Patient has not had any additional episodes of emesis while in the emergency room today.  CT shows liquid stool consistent with diarrhea.  Patient's creatinine is mildly elevated compared to previous, suspect he may be little dehydrated, given IV fluids while in the ER.  His lipase and CBC are unremarkable.  Patient's urine is clear, he does not any urinary symptoms, will not hold him for urinalysis which has not been collected and sent.  Recommend patient follow-up with his primary care provider this week, return to ER for worsening or concerning symptoms.  Recommend clear liquid diet advance to bland diet as tolerated.  Given prescription for Zofran ODT.   [LM]    Clinical Course User Index [LM] Jeannie FendMurphy, Terrill Alperin A, PA-C   Final Clinical Impressions(s) / ED Diagnoses   Final diagnoses:  Nausea vomiting and diarrhea  Generalized abdominal pain    ED Discharge Orders         Ordered    ondansetron (ZOFRAN ODT) 4 MG disintegrating tablet  Every 8 hours PRN     02/06/18 2052           Jeannie FendMurphy, Deniece Rankin A, PA-C 02/06/18 2055    Mesner, Barbara CowerJason, MD 02/07/18 [phone]

## 2018-02-06 NOTE — ED Triage Notes (Signed)
C/o generalized abd pain, nausea, vomiting, and diarrhea since this morning.  States he was seen in ED yesterday for abscess I &D.  Also reports "knot' in throat".  Speaking in complete sentences.  NAD.

## 2018-04-16 ENCOUNTER — Encounter (HOSPITAL_COMMUNITY): Payer: Medicaid Other

## 2018-04-16 ENCOUNTER — Ambulatory Visit: Payer: Medicaid Other | Admitting: Family

## 2018-05-07 ENCOUNTER — Encounter: Payer: Self-pay | Admitting: Physical Therapy

## 2018-05-07 ENCOUNTER — Ambulatory Visit: Payer: Medicaid Other | Attending: Neurosurgery | Admitting: Physical Therapy

## 2018-05-07 ENCOUNTER — Other Ambulatory Visit: Payer: Self-pay

## 2018-05-07 DIAGNOSIS — R2689 Other abnormalities of gait and mobility: Secondary | ICD-10-CM | POA: Diagnosis present

## 2018-05-07 DIAGNOSIS — R2681 Unsteadiness on feet: Secondary | ICD-10-CM | POA: Insufficient documentation

## 2018-05-07 DIAGNOSIS — M545 Low back pain: Secondary | ICD-10-CM | POA: Insufficient documentation

## 2018-05-07 DIAGNOSIS — M6281 Muscle weakness (generalized): Secondary | ICD-10-CM | POA: Insufficient documentation

## 2018-05-07 DIAGNOSIS — G8929 Other chronic pain: Secondary | ICD-10-CM | POA: Diagnosis present

## 2018-05-07 NOTE — Therapy (Signed)
[facility] [address] [place], [place], [address] Phone: [phone]   Fax:  [phone]  Physical Therapy Evaluation  Patient Details  Name: James Stevens MRN: [id number] Date of Birth: 1960-12-28 Referring Provider (PT): Tressie Stalker, MD   Encounter Date: 05/07/2018  PT End of Session - 05/07/18 1527    Visit Number  1    Number of Visits  13    Date for PT Re-Evaluation  07/02/18    Authorization Type  MCD (resubmit at 4th visit)    PT Start Time  1447    PT Stop Time  1528    PT Time Calculation (min)  41 min    Activity Tolerance  Patient tolerated treatment well       Past Medical History:  Diagnosis Date  . Arthritis   . Bipolar affective (HCC)   . Chronic back pain    and right hip  . Depression   . GERD (gastroesophageal reflux disease)    occ tums  . Headache(784.0)    migraines  . Hip pain   . 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 Peripheral vascular disease (HCC)   . Schizophrenia [facility])     Past Surgical History:  Procedure Laterality Date  . AORTA - BILATERAL FEMORAL ARTERY BYPASS GRAFT N/A 10/07/2013   Procedure: AORTA BIFEMORAL BYPASS GRAFT;  Surgeon: Sherren Kerns, MD;  Location: [facility] OR;  Service: Vascular;  Laterality: N/A;  . ESOPHAGEAL DILATION    . ESOPHAGOGASTRODUODENOSCOPY  09/06/2011   Procedure: ESOPHAGOGASTRODUODENOSCOPY (EGD);  Surgeon: Vertell Novak., MD;  Location: [facility];  Service: Endoscopy;  Laterality: N/A;  . FRACTURE SURGERY     left fingers # 4,5  . JOINT REPLACEMENT Bilateral    rt hip replacement, and  LT hip  . LUMBAR LAMINECTOMY N/A 05/05/2014   Procedure: BILATERAL LUMBAR LAMINECTOMY L4-5 AND L5-S1;  Surgeon: Kerrin Champagne, MD;  Location: [facility] OR;  Service: Orthopedics;  Laterality: N/A;  . MANDIBLE FRACTURE SURGERY    . SAVORY DILATION  09/06/2011   Procedure: SAVORY DILATION;  Surgeon:  Vertell Novak., MD;  Location: [facility];  Service: Endoscopy;  Laterality: N/A;  . TOTAL HIP ARTHROPLASTY Left 04/17/2012   Procedure: LEFT TOTAL HIP ARTHROPLASTY ANTERIOR APPROACH;  Surgeon: Kathryne Hitch, MD;  Location: [facility] OR;  Service: Orthopedics;  Laterality: Left;    There were no vitals filed for this visit.   Subjective Assessment - 05/07/18 1451    Subjective  pt is a 58 y.o with referral for low back pain but notes CC being LLE pain starting in the feet and progresses to the calf and up the thigh. pain started 3 weeks ago with no specific onset.  He reports hx or 2 back surgeries and is unsure if the pain is coming from the back or in the leg. reports hx of LLD on the R. reports N/T an dpain inthe LLE. since onset symptoms seem to be worsening. He denies Red flags.    Limitations  Sitting;Standing;Walking    How long can you sit comfortably?  15 min    How long can you stand comfortably?  5 min    How long can you walk comfortably?  2-3 min (uses motorized w/c for long distances)    Diagnostic tests  unsure if he had any recent imaging    Patient Stated Goals  decrease pain, increased  walking distance    Currently in Pain?  Yes    Pain Score  8    at worst 10/10   Pain Location  Back    Pain Orientation  Left;Right    Pain Descriptors / Indicators  Numbness;Aching;Sharp    Pain Type  Chronic pain    Pain Radiating Towards  down the LLE    Pain Onset  More than a month ago    Pain Frequency  Intermittent    Aggravating Factors   standing/ walking for too long, otherwise unsure pain comes when it wants     Pain Relieving Factors  sleeping, pain medication     Effect of Pain on Daily Activities  limited walking/standing and general mobility         [facility] PT Assessment - 05/07/18 1459      Assessment   Medical Diagnosis  Low back pain    Referring Provider (PT)  Tressie Stalker, MD    Onset Date/Surgical Date  --   3 weeks ago   Hand Dominance  Right     Next MD Visit  follow up after Pt     Prior Therapy  no      Precautions   Precautions  None      Restrictions   Weight Bearing Restrictions  No      Balance Screen   Has the patient fallen in the past 6 months  No    Has the patient had a decrease in activity level because of a fear of falling?   No    Is the patient reluctant to leave their home because of a fear of falling?   No      Home Nurse, mental health  Private residence    Living Arrangements  Parent    Available Help at Discharge  Family    Type of Home  Apartment    Home Access  Stairs to enter    Entrance Stairs-Number of Steps  5    Entrance Stairs-Rails  Cannot reach both    Home Layout  One level    Home Equipment  Wheelchair - power;Cane - single point      Prior Function   Level of Independence  Needs assistance with homemaking;Needs assistance with ADLs;Requires assistive device for independence    Meal Prep  Moderate    Laundry  Moderate    Vacuuming  Moderate    Vocation  On disability      Cognition   Overall Cognitive Status  Within Functional Limits for tasks assessed      Posture/Postural Control   Posture/Postural Control  Postural limitations    Postural Limitations  Rounded Shoulders;Forward head;Decreased lumbar lordosis      ROM / Strength   AROM / PROM / Strength  AROM;Strength      AROM   AROM Assessment Site  Lumbar    Lumbar Flexion  52   ERP and returnig to erect pos.   Lumbar Extension  30   ERP   Lumbar - Right Side Bend  18   ERP   Lumbar - Left Side Bend  20   ERP     Strength   Overall Strength Comments  general core strength 4/5    Strength Assessment Site  Hip;Knee    Right/Left Hip  Right;Left    Right Hip Flexion  4-/5    Right Hip Extension  4-/5    Right Hip ABduction  4-/5  Right Hip ADduction  4/5    Left Hip Flexion  4-/5    Left Hip Extension  4-/5    Left Hip ABduction  4-/5    Left Hip ADduction  4/5    Right/Left Knee  Right;Left     Right Knee Flexion  4/5    Right Knee Extension  4/5    Left Knee Flexion  4-/5    Left Knee Extension  4/5      Palpation   Palpation comment  TTP along bil lumbar paraspinals L>R and along L spinous process L1-L5      Ambulation/Gait   Ambulation/Gait  Yes    Ambulation Distance (Feet)  120 Feet    Gait Pattern  Step-through pattern;Decreased stride length;Trendelenburg;Antalgic;Decreased trunk rotation   used hand railing along wall   Gait Comments  pt arrived to clinic in power w/c but opted to walk from waiting room to treatment area                 Objective measurements completed on examination: See above findings.              PT Education - 05/07/18 1515    Education Details  evaluation findings, POC, goals, HEP with proper form/ rationale.     Person(s) Educated  Patient    Methods  Explanation;Handout    Comprehension  Verbalized understanding;Verbal cues required       PT Short Term Goals - 05/07/18 1616      PT SHORT TERM GOAL #1   Title  pt to be I with inital HEP     Baseline  no previous HEp    Time  3    Period  Weeks    Status  New    Target Date  05/28/18      PT SHORT TERM GOAL #2   Title  pt to verbalize / demo proper posture and lifting mechanics to prevent and reduce low back/ bil LE pain    Baseline  no previous knowledge of proper posture    Time  3    Period  Weeks    Status  New    Target Date  05/28/18      PT SHORT TERM GOAL #3   Title  pt to be able to walk >/= 5 min with pain </= 5/10 in the low back/ legs to promote functional endurance     Baseline  able to stand max of 5 min, and walk 2-3 min with 8/10 low back pain     Time  3    Period  Weeks    Status  New    Target Date  07/02/18        PT Long Term Goals - 05/07/18 1618      PT LONG TERM GOAL #1   Title  pt to increase bil LE strength to >/= 4+/5 to promote hip/ core stability with standing/ walking     Baseline  bil hip abd 4-/5, hip adduction 4/5,  flexion 4-/5, and extension 4-/5    Time  6    Period  Weeks    Status  New    Target Date  07/02/18      PT LONG TERM GOAL #2   Title  pt to be able to stand and walk >/= 20 with <= 4/10 pain for functional endurance for in home and community amb.     Baseline  able to stand max of 5  min, and walk 2-3 min with 8/10 low back pain     Time  6    Period  Weeks    Status  New    Target Date  07/02/18      PT LONG TERM GOAL #3   Title  pt to be I with all HEP given as of last visit to maintain current level of function    Baseline  no previous HEP    Time  6    Period  Weeks    Status  New    Target Date  06/18/18             Plan - 05/07/18 1528    Clinical Impression Statement  pt presents to OPPT with CC of LLE pain and referral for low back pain. he notes pain started insidously 3 weeks ago. he demonstrates functional trunk mobility with pain noted at end ranges. pt arrived in a power w/c but chose to ambulate to the treatment area and reported that it was the max of his endurnace. He limited hip strength and fair core strength. He would benefit from physical therapy to reduce pain, improve hip/core strength and maximize walking / standing endurance by addressing the deficits listed. pt would be a good candiate for telehealth and discussed in session which pt stated he would be interested in.     Personal Factors and Comorbidities  Age;Transportation;Comorbidity 3+    Comorbidities  depression, schizophrenia, athritis, hx or low back surgery    Examination-Activity Limitations  Stand    Examination-Participation Restrictions  Community Activity   uses power w/c for mobility   Stability/Clinical Decision Making  Evolving/Moderate complexity    Clinical Decision Making  Moderate    Rehab Potential  Good    PT Frequency  2x / week    PT Duration  6 weeks    PT Treatment/Interventions  ADLs/Self Care Home Management;Ultrasound;Iontophoresis 4mg /ml Dexamethasone;Moist Heat;Manual  techniques;Taping;Dry needling;Therapeutic activities;Patient/family education;Therapeutic exercise;Balance training;Cryotherapy;Gait training;Stair training    PT Next Visit Plan  review/ update HEP, hip/ core strengthening, STW along L lumbar paraspinals, endurance trainig, balance training    PT Home Exercise Plan  lower trunk rotation, supine marching, sidelying hip abduction, brige    Consulted and Agree with Plan of Care  Patient       Patient will benefit from skilled therapeutic intervention in order to improve the following deficits and impairments:  Pain, Decreased endurance, Increased fascial restricitons, Postural dysfunction, Improper body mechanics, Decreased activity tolerance, Abnormal gait, Decreased range of motion  Visit Diagnosis: Chronic bilateral low back pain, unspecified whether sciatica present  Muscle weakness (generalized)  Other abnormalities of gait and mobility  Unsteadiness on feet     Problem List Patient Active Problem List   Diagnosis Date Noted  . History of bilateral hip replacements 07/03/2017  . Bilateral hip pain 07/03/2017  . Lumbar herniated disc 03/23/2016  . 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 HNP (herniated nucleus pulposus), lumbar 05/05/2014    Class: Chronic  . PVD (peripheral vascular disease) (HCC) 10/30/2013  . Aortoiliac occlusive disease (HCC) 10/07/2013  . Atherosclerosis of native arteries of the extremities with intermittent claudication 09/05/2013  . Peripheral vascular disease, unspecified (HCC) 08/29/2013  . Avascular necrosis of hip (HCC) 04/17/2012   James Stevens PT, DPT, LAT, ATC  05/07/18  4:25 PM      [facility] [address] [place], [place],  [address] Phone: [phone]   Fax:  [phone]  Name: James Stevens MRN: [id number] Date of Birth: 1961-01-05

## 2018-06-20 ENCOUNTER — Other Ambulatory Visit: Payer: Self-pay

## 2018-06-20 ENCOUNTER — Telehealth (HOSPITAL_COMMUNITY): Payer: Self-pay | Admitting: Rehabilitation

## 2018-06-20 DIAGNOSIS — I739 Peripheral vascular disease, unspecified: Secondary | ICD-10-CM

## 2018-06-20 NOTE — Telephone Encounter (Signed)

## 2018-06-21 ENCOUNTER — Ambulatory Visit (INDEPENDENT_AMBULATORY_CARE_PROVIDER_SITE_OTHER): Payer: Medicaid Other | Admitting: Family

## 2018-06-21 ENCOUNTER — Ambulatory Visit (HOSPITAL_COMMUNITY)
Admission: RE | Admit: 2018-06-21 | Discharge: 2018-06-21 | Disposition: A | Discharge status: Home / Self Care | Payer: Medicaid Other | Source: Ambulatory Visit | Attending: Family | Admitting: Family

## 2018-06-21 ENCOUNTER — Encounter: Payer: Self-pay | Admitting: Family

## 2018-06-21 ENCOUNTER — Other Ambulatory Visit: Payer: Self-pay

## 2018-06-21 ENCOUNTER — Encounter: Payer: Self-pay | Admitting: *Deleted

## 2018-06-21 VITALS — BP 113/78 | HR 87 | Temp 98.1°F | Ht 69.0 in | Wt 143.0 lb

## 2018-06-21 DIAGNOSIS — I7409 Other arterial embolism and thrombosis of abdominal aorta: Secondary | ICD-10-CM | POA: Diagnosis not present

## 2018-06-21 DIAGNOSIS — I739 Peripheral vascular disease, unspecified: Secondary | ICD-10-CM

## 2018-06-21 DIAGNOSIS — Z95828 Presence of other vascular implants and grafts: Secondary | ICD-10-CM | POA: Diagnosis not present

## 2018-06-21 DIAGNOSIS — F17200 Nicotine dependence, unspecified, uncomplicated: Secondary | ICD-10-CM

## 2018-06-21 NOTE — Progress Notes (Signed)
[facility] [place]   CC: Follow up peripheral artery occlusive disease  History of Present Illness James Stevens is a 58 y.o. male who is s/p aortobifemoral bypass grafting on 10/07/13 byDr. Carmie Kanner aortoiliac occlusive disease.  He states one leg became shorter than the other after his bilateral hip replacements.  In March 2018 he had L4-S1 surgery.He also has had lumbar spine surgery in the past.  He denies non healing wounds in his feet/legs. He states that his left foot feels cool and has less sensation than his right foot.   After walking about 8 minutes his left calf feels painful and he has to stop, this started about 1-2 months ago and seems to be worsening. His left thigh can feel numb when supine.  He had both hips replaced. He sees Dr. Arnoldo Morale for his back, sees Dr. Rush Farmer for his hips.  He is in a motorized w/c today.   He denies any history of stroke or TIA.   Serum creatinine on 02-06-18 was 1.43.   He states that his brother was found dead yesterday.   Diabetic: Yes Tobacco use: smoker (1-2 cigarettes/day, started at age 9yrs)   Pt meds include: Statin :No Betablocker: No ASA: No, he stopped taking for unknown reason Other anticoagulants/antiplatelets: no  Past Medical History:  Diagnosis Date  . Arthritis   . Bipolar affective ([place])   . Chronic back pain    and right hip  . Depression   . GERD (gastroesophageal reflux disease)    occ tums  . Headache(784.0)    migraines  . Hip pain   . 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 Peripheral vascular disease ([place])   . Schizophrenia [facility])     Social History Social History   Tobacco Use  . Smoking status: Current Every Day Smoker    Packs/day: 0.20    Years: 34.00    Pack years: 6.80    Types: Cigarettes  . Smokeless tobacco: Never Used  . Tobacco comment: 3-4 cigarettes per day  Substance Use Topics   . Alcohol use: Yes    Comment: once a week   . Drug use: Yes    Frequency: 2.0 times per week    Types: Cocaine, Marijuana    Family History Family History  Problem Relation Age of Onset  . Diabetes Mother   . Diabetes Father   . Diabetes Other   . Diabetes Brother     Past Surgical History:  Procedure Laterality Date  . AORTA - BILATERAL FEMORAL ARTERY BYPASS GRAFT N/A 10/07/2013   Procedure: AORTA BIFEMORAL BYPASS GRAFT;  Surgeon: Elam Dutch, MD;  Location: [facility];  Service: Vascular;  Laterality: N/A;  . ESOPHAGEAL DILATION    . ESOPHAGOGASTRODUODENOSCOPY  09/06/2011   Procedure: ESOPHAGOGASTRODUODENOSCOPY (EGD);  Surgeon: Winfield Cunas., MD;  Location[facility];  Service: Endoscopy;  Laterality: N/A;  . FRACTURE SURGERY     left fingers # 4,5  . JOINT REPLACEMENT Bilateral    rt hip replacement, and  LT hip  . LUMBAR LAMINECTOMY N/A 05/05/2014   Procedure: BILATERAL LUMBAR LAMINECTOMY L4-5 AND L5-S1;  Surgeon: Jessy Oto, MD;  Location: [facility];  Service: Orthopedics;  Laterality: N/A;  . MANDIBLE FRACTURE SURGERY    . SAVORY DILATION  09/06/2011   Procedure: SAVORY DILATION;  Surgeon: Winfield Cunas., MD;  Location: [facility];  Service: Endoscopy;  Laterality:  N/A;  . TOTAL HIP ARTHROPLASTY Left 04/17/2012   Procedure: LEFT TOTAL HIP ARTHROPLASTY ANTERIOR APPROACH;  Surgeon: Kathryne Hitchhristopher Y Blackman, MD;  Location: [facility] OR;  Service: Orthopedics;  Laterality: Left;    Allergies  Allergen Reactions  . Ibuprofen Other (See Comments)    Gastric irritation  . Tramadol Itching    Current Outpatient Medications  Medication Sig Dispense Refill  . doxycycline (VIBRAMYCIN) 100 MG capsule Take 1 capsule (100 mg total) by mouth 2 (two) times daily. One po bid x 7 days (Patient not taking: Reported on 06/21/2018) 14 capsule 0  . lidocaine (LIDODERM) 5 % Place 1 patch onto the skin daily. Remove & Discard patch within 12 hours or as directed by MD (Patient not  taking: Reported on 06/21/2018) 30 patch 0  . ondansetron (ZOFRAN ODT) 4 MG disintegrating tablet Take 1 tablet (4 mg total) by mouth every 8 (eight) hours as needed for up to 12 doses for nausea or vomiting. (Patient not taking: Reported on 06/21/2018) 12 tablet 0  . oxyCODONE-acetaminophen (PERCOCET/ROXICET) 5-325 MG tablet Take 1 tablet by mouth every 6 (six) hours as needed for severe pain. (Patient not taking: Reported on 06/21/2018) 10 tablet 0  . predniSONE (DELTASONE) 20 MG tablet Take 2 tablets (40 mg total) by mouth daily with breakfast. For the next four days (Patient not taking: Reported on 06/21/2018) 8 tablet 0   No current facility-administered medications for this visit.    Facility-Administered Medications Ordered in Other Visits  Medication Dose Route Frequency Provider Last Rate Last Dose  . lactated ringers infusion    Continuous PRN Nils PyleBell, Sarah T, CRNA      . lactated ringers infusion    Continuous PRN Nils PyleBell, Sarah T, CRNA        ROS: See HPI for pertinent positives and negatives.   Physical Examination  Vitals:   06/21/18 1532  BP: 113/78  Pulse: 87  Temp: 98.1 F (36.7 C)  TempSrc: Temporal  SpO2: 96%  Weight: 143 lb (64.9 kg)  Height: 5\' 9"  (1.753 m)   Body mass index is 21.12 kg/m.  General: A&O x 3, WDWN, male. Gait: seated in motorized w/c HEENT: No gross abnormalities.  Pulmonary: Respirations are non labored, CTAB, good air movement in all fields Cardiac: regular rhythm, no detected murmur.         Carotid Bruits Right Left   Negative Negative   Radial pulses are 2+ palpable bilaterally   Adominal aortic pulse is not palpable                         VASCULAR EXAM: Extremities without ischemic changes, without Gangrene; without open wounds. Left foot is cool to touch, diminished sensation compared to right foot.                                                                                                          LE Pulses Right Left        FEMORAL  2+ palpable  not palpable  POPLITEAL  not palpable   not palpable       POSTERIOR TIBIAL  not palpable   not palpable        DORSALIS PEDIS      ANTERIOR TIBIAL not palpable  not palpable    Abdomen: soft, NT, no palpable masses. Skin: no rashes, no cellulitis, no ulcers noted. Musculoskeletal: no muscle wasting or atrophy.  Neurologic: A&O X 3; appropriate affect, Sensation is normal except diminished in left foot; MOTOR FUNCTION:  moving all extremities equally, motor strength 5/5 throughout. Speech is slightly hesitating. CN 2-12 intact. Psychiatric: Thought content is normal, mood appropriate for clinical situation.    DATA  ABI (Date: 06/21/2018): ABI Findings: +---------+------------------+-----+----------+--------+ Right    Rt Pressure (mmHg)IndexWaveform  Comment  +---------+------------------+-----+----------+--------+ Brachial 139                                       +---------+------------------+-----+----------+--------+ ATA      105               0.76 monophasic         +---------+------------------+-----+----------+--------+ PTA      101               0.73 monophasic         +---------+------------------+-----+----------+--------+ Great Toe75                0.54                    +---------+------------------+-----+----------+--------+  +---------+------------------+-----+----------+-------+ Left     Lt Pressure (mmHg)IndexWaveform  Comment +---------+------------------+-----+----------+-------+ Brachial 133                                      +---------+------------------+-----+----------+-------+ ATA      70                0.50 monophasic (was bi) +---------+------------------+-----+----------+-------+ PTA      71                0.51 monophasic (was bi) +---------+------------------+-----+----------+-------+ Great Toe38                0.27                    +---------+------------------+-----+----------+-------+  +-------+-----------+-----------+------------+------------+ ABI/TBIToday's ABIToday's TBIPrevious ABIPrevious TBI +-------+-----------+-----------+------------+------------+ Right  0.76       0.54       0.73        0.56         +-------+-----------+-----------+------------+------------+ Left   0.51       0.27       0.99        0.68         +-------+-----------+-----------+------------+------------+  Right ABIs appear essentially unchanged compared to prior study on 04/07/2017. Left ABIs appear decreased compared to prior study on 04/07/2017. Summary: Right: Resting right ankle-brachial index indicates moderate right lower extremity arterial disease. The right toe-brachial index is abnormal. RT great toe pressure = 75 mmHg. Left: Resting left ankle-brachial index indicates moderate left lower extremity arterial disease. The left toe-brachial index is abnormal. LT Great toe pressure = 38 mmHg.    ASSESSMENT: James Stevens is a 58 y.o. male who is s/p aortobifemoral bypass grafting on 10/07/13.  The last time I evaluated him was in March  2019. At that time he had 2+ palpable bilateral femoral pulses, today his left femoral pulse is not palpable, left foot is cool, no signs of ischemia in his extremities, and left foot has diminished sensation.  ABI's today correspond to his symptoms and physical exam results: decline in left ABI to 0.51 from 0.99. Right ABI remains stable at 0.76. Waveforms are monophasic bilaterally, were biphasic in the left in March 2019.   His primary atherosclerotic risk factor remains active smoking since age 58, but he has decreased use to 3 cigarettes/day. Fortunately he does not have DM, he has a normal BMI, and walks a good part of his day. See Plan.   He is trying to walk but left calf is painful after walking 8 minutes, this started about 1-2 months ago.    PLAN:  Based on  the patient's vascular studies and examination, and after discussing with Dr. Darrick PennaFields, pt will be scheduled for an arteriogram on a Friday with Dr. Darrick PennaFields on 06-29-18, with run off, possible intervention left iliacs or left LE.   The patient was counseled re smoking cessation and given some printed information re smoking cessation.   I discussed in depth with the patient the nature of atherosclerosis, and emphasized the importance of maximal medical management including strict control of blood pressure, blood glucose, and lipid levels, obtaining regular exercise, and cessation of smoking.  The patient is aware that without maximal medical management the underlying atherosclerotic disease process will progress, limiting the benefit of any interventions.  The patient was given information about PAD including signs, symptoms, treatment, what symptoms should prompt the patient to seek immediate medical care, and risk reduction measures to take.  Charisse MarchSuzanne Yoshi Mancillas, RN, MSN, FNP-C [facility] [place] Office Phone: [phone]  [facility]  06/21/18 3:44 PM

## 2018-06-21 NOTE — H&P (View-Only) (Signed)
[facility] [place]   CC: Follow up peripheral artery occlusive disease  History of Present Illness LEV CERVONE is a 58 y.o. male who is s/p aortobifemoral bypass grafting on 10/07/13 byDr. Carmie Kanner aortoiliac occlusive disease.  He states one leg became shorter than the other after his bilateral hip replacements.  In March 2018 he had L4-S1 surgery.He also has had lumbar spine surgery in the past.  He denies non healing wounds in his feet/legs. He states that his left foot feels cool and has less sensation than his right foot.   After walking about 8 minutes his left calf feels painful and he has to stop, this started about 1-2 months ago and seems to be worsening. His left thigh can feel numb when supine.  He had both hips replaced. He sees Dr. Arnoldo Morale for his back, sees Dr. Rush Farmer for his hips.  He is in a motorized w/c today.   He denies any history of stroke or TIA.   Serum creatinine on 02-06-18 was 1.43.   He states that his brother was found dead yesterday.   Diabetic: Yes Tobacco use: smoker (1-2 cigarettes/day, started at age 9yrs)   Pt meds include: Statin :No Betablocker: No ASA: No, he stopped taking for unknown reason Other anticoagulants/antiplatelets: no  Past Medical History:  Diagnosis Date  . Arthritis   . Bipolar affective ([place])   . Chronic back pain    and right hip  . Depression   . GERD (gastroesophageal reflux disease)    occ tums  . Headache(784.0)    migraines  . Hip pain   . 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 Peripheral vascular disease ([place])   . Schizophrenia [facility])     Social History Social History   Tobacco Use  . Smoking status: Current Every Day Smoker    Packs/day: 0.20    Years: 34.00    Pack years: 6.80    Types: Cigarettes  . Smokeless tobacco: Never Used  . Tobacco comment: 3-4 cigarettes per day  Substance Use Topics   . Alcohol use: Yes    Comment: once a week   . Drug use: Yes    Frequency: 2.0 times per week    Types: Cocaine, Marijuana    Family History Family History  Problem Relation Age of Onset  . Diabetes Mother   . Diabetes Father   . Diabetes Other   . Diabetes Brother     Past Surgical History:  Procedure Laterality Date  . AORTA - BILATERAL FEMORAL ARTERY BYPASS GRAFT N/A 10/07/2013   Procedure: AORTA BIFEMORAL BYPASS GRAFT;  Surgeon: Elam Dutch, MD;  Location: [facility];  Service: Vascular;  Laterality: N/A;  . ESOPHAGEAL DILATION    . ESOPHAGOGASTRODUODENOSCOPY  09/06/2011   Procedure: ESOPHAGOGASTRODUODENOSCOPY (EGD);  Surgeon: Winfield Cunas., MD;  Location: [facility];  Service: Endoscopy;  Laterality: N/A;  . FRACTURE SURGERY     left fingers # 4,5  . JOINT REPLACEMENT Bilateral    rt hip replacement, and  LT hip  . LUMBAR LAMINECTOMY N/A 05/05/2014   Procedure: BILATERAL LUMBAR LAMINECTOMY L4-5 AND L5-S1;  Surgeon: Jessy Oto, MD;  Location: [facility];  Service: Orthopedics;  Laterality: N/A;  . MANDIBLE FRACTURE SURGERY    . SAVORY DILATION  09/06/2011   Procedure: SAVORY DILATION;  Surgeon: Winfield Cunas., MD;  Location: [facility];  Service: Endoscopy;  Laterality:  N/A;  . TOTAL HIP ARTHROPLASTY Left 04/17/2012   Procedure: LEFT TOTAL HIP ARTHROPLASTY ANTERIOR APPROACH;  Surgeon: Christopher Y Blackman, MD;  Location: [facility];  Service: Orthopedics;  Laterality: Left;    Allergies  Allergen Reactions  . Ibuprofen Other (See Comments)    Gastric irritation  . Tramadol Itching    Current Outpatient Medications  Medication Sig Dispense Refill  . doxycycline (VIBRAMYCIN) 100 MG capsule Take 1 capsule (100 mg total) by mouth 2 (two) times daily. One po bid x 7 days (Patient not taking: Reported on 06/21/2018) 14 capsule 0  . lidocaine (LIDODERM) 5 % Place 1 patch onto the skin daily. Remove & Discard patch within 12 hours or as directed by MD (Patient not  taking: Reported on 06/21/2018) 30 patch 0  . ondansetron (ZOFRAN ODT) 4 MG disintegrating tablet Take 1 tablet (4 mg total) by mouth every 8 (eight) hours as needed for up to 12 doses for nausea or vomiting. (Patient not taking: Reported on 06/21/2018) 12 tablet 0  . oxyCODONE-acetaminophen (PERCOCET/ROXICET) 5-325 MG tablet Take 1 tablet by mouth every 6 (six) hours as needed for severe pain. (Patient not taking: Reported on 06/21/2018) 10 tablet 0  . predniSONE (DELTASONE) 20 MG tablet Take 2 tablets (40 mg total) by mouth daily with breakfast. For the next four days (Patient not taking: Reported on 06/21/2018) 8 tablet 0   No current facility-administered medications for this visit.    Facility-Administered Medications Ordered in Other Visits  Medication Dose Route Frequency Provider Last Rate Last Dose  . lactated ringers infusion    Continuous PRN Bell, Sarah T, CRNA      . lactated ringers infusion    Continuous PRN Bell, Sarah T, CRNA        ROS: See HPI for pertinent positives and negatives.   Physical Examination  Vitals:   06/21/18 1532  BP: 113/78  Pulse: 87  Temp: 98.1 F (36.7 C)  TempSrc: Temporal  SpO2: 96%  Weight: 143 lb (64.9 kg)  Height: 5' 9" (1.753 m)   Body mass index is 21.12 kg/m.  General: A&O x 3, WDWN, male. Gait: seated in motorized w/c HEENT: No gross abnormalities.  Pulmonary: Respirations are non labored, CTAB, good air movement in all fields Cardiac: regular rhythm, no detected murmur.         Carotid Bruits Right Left   Negative Negative   Radial pulses are 2+ palpable bilaterally   Adominal aortic pulse is not palpable                         VASCULAR EXAM: Extremities without ischemic changes, without Gangrene; without open wounds. Left foot is cool to touch, diminished sensation compared to right foot.                                                                                                          LE Pulses Right Left        FEMORAL  2+ palpable  not palpable          POPLITEAL  not palpable   not palpable       POSTERIOR TIBIAL  not palpable   not palpable        DORSALIS PEDIS      ANTERIOR TIBIAL not palpable  not palpable    Abdomen: soft, NT, no palpable masses. Skin: no rashes, no cellulitis, no ulcers noted. Musculoskeletal: no muscle wasting or atrophy.  Neurologic: A&O X 3; appropriate affect, Sensation is normal except diminished in left foot; MOTOR FUNCTION:  moving all extremities equally, motor strength 5/5 throughout. Speech is slightly hesitating. CN 2-12 intact. Psychiatric: Thought content is normal, mood appropriate for clinical situation.    DATA  ABI (Date: 06/21/2018): ABI Findings: +---------+------------------+-----+----------+--------+ Right    Rt Pressure (mmHg)IndexWaveform  Comment  +---------+------------------+-----+----------+--------+ Brachial 139                                       +---------+------------------+-----+----------+--------+ ATA      105               0.76 monophasic         +---------+------------------+-----+----------+--------+ PTA      101               0.73 monophasic         +---------+------------------+-----+----------+--------+ Great Toe75                0.54                    +---------+------------------+-----+----------+--------+  +---------+------------------+-----+----------+-------+ Left     Lt Pressure (mmHg)IndexWaveform  Comment +---------+------------------+-----+----------+-------+ Brachial 133                                      +---------+------------------+-----+----------+-------+ ATA      70                0.50 monophasic (was bi) +---------+------------------+-----+----------+-------+ PTA      71                0.51 monophasic (was bi) +---------+------------------+-----+----------+-------+ Great Toe38                0.27                    +---------+------------------+-----+----------+-------+  +-------+-----------+-----------+------------+------------+ ABI/TBIToday's ABIToday's TBIPrevious ABIPrevious TBI +-------+-----------+-----------+------------+------------+ Right  0.76       0.54       0.73        0.56         +-------+-----------+-----------+------------+------------+ Left   0.51       0.27       0.99        0.68         +-------+-----------+-----------+------------+------------+  Right ABIs appear essentially unchanged compared to prior study on 04/07/2017. Left ABIs appear decreased compared to prior study on 04/07/2017. Summary: Right: Resting right ankle-brachial index indicates moderate right lower extremity arterial disease. The right toe-brachial index is abnormal. RT great toe pressure = 75 mmHg. Left: Resting left ankle-brachial index indicates moderate left lower extremity arterial disease. The left toe-brachial index is abnormal. LT Great toe pressure = 38 mmHg.    ASSESSMENT: Lavell AnchorsDaniel J Rhome is a 58 y.o. male who is s/p aortobifemoral bypass grafting on 10/07/13.  The last time I evaluated him was in March  2019. At that time he had 2+ palpable bilateral femoral pulses, today his left femoral pulse is not palpable, left foot is cool, no signs of ischemia in his extremities, and left foot has diminished sensation.  ABI's today correspond to his symptoms and physical exam results: decline in left ABI to 0.51 from 0.99. Right ABI remains stable at 0.76. Waveforms are monophasic bilaterally, were biphasic in the left in March 2019.   His primary atherosclerotic risk factor remains active smoking since age 58, but he has decreased use to 3 cigarettes/day. Fortunately he does not have DM, he has a normal BMI, and walks a good part of his day. See Plan.   He is trying to walk but left calf is painful after walking 8 minutes, this started about 1-2 months ago.    PLAN:  Based on  the patient's vascular studies and examination, and after discussing with Dr. Darrick PennaFields, pt will be scheduled for an arteriogram on a Friday with Dr. Darrick PennaFields on 06-29-18, with run off, possible intervention left iliacs or left LE.   The patient was counseled re smoking cessation and given some printed information re smoking cessation.   I discussed in depth with the patient the nature of atherosclerosis, and emphasized the importance of maximal medical management including strict control of blood pressure, blood glucose, and lipid levels, obtaining regular exercise, and cessation of smoking.  The patient is aware that without maximal medical management the underlying atherosclerotic disease process will progress, limiting the benefit of any interventions.  The patient was given information about PAD including signs, symptoms, treatment, what symptoms should prompt the patient to seek immediate medical care, and risk reduction measures to take.  Charisse MarchSuzanne Ravenne Wayment, RN, MSN, FNP-C [facility] [place] Office Phone: [phone]  [facility]  06/21/18 3:44 PM

## 2018-06-21 NOTE — Patient Instructions (Signed)
Steps to Quit Smoking  Smoking tobacco can be bad for your health. It can also affect almost every organ in your body. Smoking puts you and people around you at risk for many serious long-lasting (chronic) diseases. Quitting smoking is hard, but it is one of the best things that you can do for your health. It is never too late to quit. What are the benefits of quitting smoking? When you quit smoking, you lower your risk for getting serious diseases and conditions. They can include:  Lung cancer or lung disease.  Heart disease.  Stroke.  Heart attack.  Not being able to have children (infertility).  Weak bones (osteoporosis) and broken bones (fractures). If you have coughing, wheezing, and shortness of breath, those symptoms may get better when you quit. You may also get sick less often. If you are pregnant, quitting smoking can help to lower your chances of having a baby of low birth weight. What can I do to help me quit smoking? Talk with your doctor about what can help you quit smoking. Some things you can do (strategies) include:  Quitting smoking totally, instead of slowly cutting back how much you smoke over a period of time.  Going to in-person counseling. You are more likely to quit if you go to many counseling sessions.  Using resources and support systems, such as: ? Online chats with a counselor. ? Phone quitlines. ? Printed self-help materials. ? Support groups or group counseling. ? Text messaging programs. ? Mobile phone apps or applications.  Taking medicines. Some of these medicines may have nicotine in them. If you are pregnant or breastfeeding, do not take any medicines to quit smoking unless your doctor says it is okay. Talk with your doctor about counseling or other things that can help you. Talk with your doctor about using more than one strategy at the same time, such as taking medicines while you are also going to in-person counseling. This can help make  quitting easier. What things can I do to make it easier to quit? Quitting smoking might feel very hard at first, but there is a lot that you can do to make it easier. Take these steps:  Talk to your family and friends. Ask them to support and encourage you.  Call phone quitlines, reach out to support groups, or work with a counselor.  Ask people who smoke to not smoke around you.  Avoid places that make you want (trigger) to smoke, such as: ? Bars. ? Parties. ? Smoke-break areas at work.  Spend time with people who do not smoke.  Lower the stress in your life. Stress can make you want to smoke. Try these things to help your stress: ? Getting regular exercise. ? Deep-breathing exercises. ? Yoga. ? Meditating. ? Doing a body scan. To do this, close your eyes, focus on one area of your body at a time from head to toe, and notice which parts of your body are tense. Try to relax the muscles in those areas.  Download or buy apps on your mobile phone or tablet that can help you stick to your quit plan. There are many free apps, such as QuitGuide from the CDC (Centers for Disease Control and Prevention). You can find more support from smokefree.gov and other websites. This information is not intended to replace advice given to you by your health care provider. Make sure you discuss any questions you have with your health care provider. Document Released: 10/23/2008 Document Revised: 08/25/2015   Document Reviewed: 05/13/2014 Elsevier Interactive Patient Education  2019 Elsevier Inc.     Peripheral Vascular Disease  Peripheral vascular disease (PVD) is a disease of the blood vessels that are not part of your heart and brain. A simple term for PVD is poor circulation. In most cases, PVD narrows the blood vessels that carry blood from your heart to the rest of your body. This can reduce the supply of blood to your arms, legs, and internal organs, like your stomach or kidneys. However, PVD most  often affects a person's lower legs and feet. Without treatment, PVD tends to get worse. PVD can also lead to acute ischemic limb. This is when an arm or leg suddenly cannot get enough blood. This is a medical emergency. Follow these instructions at home: Lifestyle  Do not use any products that contain nicotine or tobacco, such as cigarettes and e-cigarettes. If you need help quitting, ask your doctor.  Lose weight if you are overweight. Or, stay at a healthy weight as told by your doctor.  Eat a diet that is low in fat and cholesterol. If you need help, ask your doctor.  Exercise regularly. Ask your doctor for activities that are right for you. General instructions  Take over-the-counter and prescription medicines only as told by your doctor.  Take good care of your feet: ? Wear comfortable shoes that fit well. ? Check your feet often for any cuts or sores.  Keep all follow-up visits as told by your doctor This is important. Contact a doctor if:  You have cramps in your legs when you walk.  You have leg pain when you are at rest.  You have coldness in a leg or foot.  Your skin changes.  You are unable to get or have an erection (erectile dysfunction).  You have cuts or sores on your feet that do not heal. Get help right away if:  Your arm or leg turns cold, numb, and blue.  Your arms or legs become red, warm, swollen, painful, or numb.  You have chest pain.  You have trouble breathing.  You suddenly have weakness in your face, arm, or leg.  You become very confused or you cannot speak.  You suddenly have a very bad headache.  You suddenly cannot see. Summary  Peripheral vascular disease (PVD) is a disease of the blood vessels.  A simple term for PVD is poor circulation. Without treatment, PVD tends to get worse.  Treatment may include exercise, low fat and low cholesterol diet, and quitting smoking. This information is not intended to replace advice given to  you by your health care provider. Make sure you discuss any questions you have with your health care provider. Document Released: 03/23/2009 Document Revised: 02/04/2016 Document Reviewed: 02/04/2016 Elsevier Interactive Patient Education  2019 Elsevier Inc.  

## 2018-06-26 ENCOUNTER — Other Ambulatory Visit (HOSPITAL_COMMUNITY)
Admission: RE | Admit: 2018-06-26 | Discharge: 2018-06-26 | Disposition: A | Discharge status: Home / Self Care | Payer: Medicaid Other | Source: Ambulatory Visit | Attending: Vascular Surgery | Admitting: Vascular Surgery

## 2018-06-26 DIAGNOSIS — Z1159 Encounter for screening for other viral diseases: Secondary | ICD-10-CM | POA: Diagnosis not present

## 2018-06-27 LAB — NOVEL CORONAVIRUS, NAA (HOSP ORDER, SEND-OUT TO REF LAB; TAT 18-24 HRS): SARS-CoV-2, NAA: NOT DETECTED

## 2018-06-29 ENCOUNTER — Ambulatory Visit (HOSPITAL_COMMUNITY)
Admission: RE | Admit: 2018-06-29 | Discharge: 2018-06-29 | Disposition: A | Discharge status: Home / Self Care | Payer: Medicaid Other | Attending: Vascular Surgery | Admitting: Vascular Surgery

## 2018-06-29 ENCOUNTER — Encounter (HOSPITAL_COMMUNITY)
Admission: RE | Disposition: A | Discharge status: Home / Self Care | Payer: Self-pay | Source: Home / Self Care | Attending: Vascular Surgery

## 2018-06-29 ENCOUNTER — Other Ambulatory Visit: Payer: Self-pay

## 2018-06-29 DIAGNOSIS — Z79899 Other long term (current) drug therapy: Secondary | ICD-10-CM | POA: Diagnosis not present

## 2018-06-29 DIAGNOSIS — E1151 Type 2 diabetes mellitus with diabetic peripheral angiopathy without gangrene: Secondary | ICD-10-CM | POA: Insufficient documentation

## 2018-06-29 DIAGNOSIS — I70212 Atherosclerosis of native arteries of extremities with intermittent claudication, left leg: Secondary | ICD-10-CM | POA: Insufficient documentation

## 2018-06-29 DIAGNOSIS — Z833 Family history of diabetes mellitus: Secondary | ICD-10-CM | POA: Diagnosis not present

## 2018-06-29 DIAGNOSIS — Z886 Allergy status to analgesic agent status: Secondary | ICD-10-CM | POA: Diagnosis not present

## 2018-06-29 DIAGNOSIS — F17210 Nicotine dependence, cigarettes, uncomplicated: Secondary | ICD-10-CM | POA: Diagnosis not present

## 2018-06-29 DIAGNOSIS — Z96643 Presence of artificial hip joint, bilateral: Secondary | ICD-10-CM | POA: Insufficient documentation

## 2018-06-29 DIAGNOSIS — M1990 Unspecified osteoarthritis, unspecified site: Secondary | ICD-10-CM | POA: Diagnosis not present

## 2018-06-29 DIAGNOSIS — Z885 Allergy status to narcotic agent status: Secondary | ICD-10-CM | POA: Diagnosis not present

## 2018-06-29 DIAGNOSIS — K219 Gastro-esophageal reflux disease without esophagitis: Secondary | ICD-10-CM | POA: Insufficient documentation

## 2018-06-29 DIAGNOSIS — Z95820 Peripheral vascular angioplasty status with implants and grafts: Secondary | ICD-10-CM | POA: Insufficient documentation

## 2018-06-29 HISTORY — PX: ABDOMINAL AORTOGRAM W/LOWER EXTREMITY: CATH118223

## 2018-06-29 LAB — POCT I-STAT, CHEM 8
BUN: 13 mg/dL (ref 6–20)
Calcium, Ion: 1.17 mmol/L (ref 1.15–1.40)
Chloride: 107 mmol/L (ref 98–111)
Creatinine, Ser: 1.2 mg/dL (ref 0.61–1.24)
Glucose, Bld: 80 mg/dL (ref 70–99)
HCT: 47 % (ref 39.0–52.0)
Hemoglobin: 16 g/dL (ref 13.0–17.0)
Potassium: 3.5 mmol/L (ref 3.5–5.1)
Sodium: 140 mmol/L (ref 135–145)
TCO2: 25 mmol/L (ref 22–32)

## 2018-06-29 SURGERY — ABDOMINAL AORTOGRAM W/LOWER EXTREMITY
Anesthesia: LOCAL | Laterality: Bilateral

## 2018-06-29 MED ORDER — LIDOCAINE HCL (PF) 1 % IJ SOLN
INTRAMUSCULAR | Status: DC | PRN
  Administered 2018-06-29: 15 mL

## 2018-06-29 MED ORDER — HEPARIN (PORCINE) IN NACL 1000-0.9 UT/500ML-% IV SOLN
INTRAVENOUS | Status: DC | PRN
  Administered 2018-06-29: 500 mL

## 2018-06-29 MED ORDER — HYDRALAZINE HCL 20 MG/ML IJ SOLN: 5.0000 mg | INTRAMUSCULAR | Status: DC | PRN

## 2018-06-29 MED ORDER — HYDRALAZINE HCL 20 MG/ML IJ SOLN
INTRAMUSCULAR | Status: DC | PRN
  Administered 2018-06-29 (×2): 10 mg via INTRAVENOUS

## 2018-06-29 MED ORDER — LABETALOL HCL 5 MG/ML IV SOLN
INTRAVENOUS | Status: AC
  Filled 2018-06-29: qty 4

## 2018-06-29 MED ORDER — IODIXANOL 320 MG/ML IV SOLN
INTRAVENOUS | Status: DC | PRN
  Administered 2018-06-29: 180 mL via INTRA_ARTERIAL

## 2018-06-29 MED ORDER — LIDOCAINE HCL (PF) 1 % IJ SOLN
INTRAMUSCULAR | Status: AC
  Filled 2018-06-29: qty 30

## 2018-06-29 MED ORDER — HYDRALAZINE HCL 20 MG/ML IJ SOLN
INTRAMUSCULAR | Status: AC
  Filled 2018-06-29: qty 1

## 2018-06-29 MED ORDER — LABETALOL HCL 5 MG/ML IV SOLN
10.0000 mg | INTRAVENOUS | Status: DC | PRN
  Administered 2018-06-29 (×2): 10 mg via INTRAVENOUS

## 2018-06-29 MED ORDER — SODIUM CHLORIDE 0.9 % IV SOLN: INTRAVENOUS | Status: AC

## 2018-06-29 MED ORDER — SODIUM CHLORIDE 0.9 % IV SOLN
INTRAVENOUS | Status: DC
  Administered 2018-06-29: 10:00:00 via INTRAVENOUS

## 2018-06-29 MED ORDER — HEPARIN (PORCINE) IN NACL 1000-0.9 UT/500ML-% IV SOLN
INTRAVENOUS | Status: AC
  Filled 2018-06-29: qty 1000

## 2018-06-29 SURGICAL SUPPLY — 7 items
CATH ANGIO 5F PIGTAIL 65CM (CATHETERS) ×1 IMPLANT
KIT PV (KITS) ×2 IMPLANT
SHEATH PINNACLE 5F 10CM (SHEATH) ×1 IMPLANT
SHEATH PROBE COVER 6X72 (BAG) ×1 IMPLANT
TRANSDUCER W/STOPCOCK (MISCELLANEOUS) ×2 IMPLANT
TRAY PV CATH (CUSTOM PROCEDURE TRAY) ×2 IMPLANT
WIRE HITORQ VERSACORE ST 145CM (WIRE) ×1 IMPLANT

## 2018-06-29 NOTE — Progress Notes (Signed)
No bleeding or hematoma noted after ambulation 

## 2018-06-29 NOTE — Progress Notes (Signed)
Report received from [facility] assumed at this time

## 2018-06-29 NOTE — Op Note (Signed)
Procedure: Abdominal aortogram with bilateral lower extremity runoff  Preoperative diagnosis: Claudication left leg  Postoperative diagnosis: Same  Anesthesia: Local  Operative findings: #1 occlusion left limb aortobifemoral bypass reconstitution of femoral bifurcation with 50% left superficial femoral artery stenosis patent popliteal and tibial vessels three-vessel runoff  2.  Patent right aortobifemoral limb mid right SFA occlusion three-vessel runoff right foot  Operative details: After team informed consent, patient was taken to the [facility] lab.  The patient was placed in supine position on the angios table.  Both groins were prepped and draped in usual sterile fashion.  Local anesthesia was infiltrated over the right common femoral artery.  Ultrasound was used to identify the right limb of the aortobifemoral bypass graft.  Local anesthesia was then treated over this.  Using ultrasound guidance the right limb was successfully cannulated.  Image was obtained placed in the patient's chart.  035 versa core wire threaded up in the abdominal aorta under fluoroscopic guidance.  A 5 French sheath was placed over the guidewire in the right common femoral artery.  5 French pigtail catheter was advanced over the guidewire in the abdominal aorta and abdominal aortogram was obtained.  Left and right renal arteries are patent.  The infrarenal abdominal aorta has an aortobifemoral bypass graft which is patent.  The left limb is occluded just after its origin.  The right limb of the graft is patent.  There is some filling of pelvic collaterals.  Oblique views were obtained due to overlying orthopedic hardware.  The distal left femoral bifurcation does reconstitute via pelvic collaterals.  The profunda is patent.  The proximal superficial femoral artery is patent on the left side.  On the right side the right common femoral profunda femoris and superficial femoral arteries are patent.  At this point bilateral lower  extremity runoff views were obtained through the pigtail catheter.  In the left lower extremity, the left common femoral appears occluded but the femoral bifurcation is patent.  The profunda is patent.  The superficial femoral artery is patent with about a 50% narrowing in the mid thigh.  Popliteal artery is patent with three-vessel runoff to the left foot.  In the right lower extremity the right common femoral profunda femoris and superficial femoral arteries are patent.  The right superficial femoral artery does occluded the adductor hiatus.  Right popliteal artery is patent with three-vessel runoff to the right foot.  At this point pigtail catheter was pulled back over guidewire.  The 5 French sheath left in place to be pulled in the holding area.  The patient tolerated procedure well and there were no complications.  Patient was taken to holding area in stable condition.  Operative management: The patient will be scheduled in the near future for a right to left femoral-femoral bypass after cardiac risk stratification.  Ruta Hinds, MD [facility] [place] Office: [phone] Pager: [phone]

## 2018-06-29 NOTE — Interval H&P Note (Signed)
History and Physical Interval Note:  06/29/2018 11:47 AM  James Stevens  has presented today for surgery, with the diagnosis of PVD.  The various methods of treatment have been discussed with the patient and family. After consideration of risks, benefits and other options for treatment, the patient has consented to  Procedure(s): ABDOMINAL AORTOGRAM W/LOWER EXTREMITY (Bilateral) as a surgical intervention.  The patient's history has been reviewed, patient examined, no change in status, stable for surgery.  I have reviewed the patient's chart and labs.  Questions were answered to the patient's satisfaction.     Ruta Hinds

## 2018-06-29 NOTE — Progress Notes (Signed)
Site area: rt groin fa sheath pulled and pressure held by [employer] Prior to Removal:  Level 0 Pressure Applied For:  25 minutes Manual:   yes Patient Status During Pull:  stable Post Pull Site:  Level  0 Post Pull Instructions Given:  yes Post Pull Pulses Present: rt dp dopplered Dressing Applied:  Gauze and tegaderm Bedrest begins @ 1749 Comments:

## 2018-06-29 NOTE — Discharge Instructions (Signed)
Moderate Conscious Sedation, Adult, Care After °These instructions provide you with information about caring for yourself after your procedure. Your health care provider may also give you more specific instructions. Your treatment has been planned according to current medical practices, but problems sometimes occur. Call your health care provider if you have any problems or questions after your procedure. °What can I expect after the procedure? °After your procedure, it is common: °· To feel sleepy for several hours. °· To feel clumsy and have poor balance for several hours. °· To have poor judgment for several hours. °· To vomit if you eat too soon. °Follow these instructions at home: °For at least 24 hours after the procedure: ° °· Do not: °? Participate in activities where you could fall or become injured. °? Drive. °? Use heavy machinery. °? Drink alcohol. °? Take sleeping pills or medicines that cause drowsiness. °? Make important decisions or sign legal documents. °? Take care of children on your own. °· Rest. °Eating and drinking °· Follow the diet recommended by your health care provider. °· If you vomit: °? Drink water, juice, or soup when you can drink without vomiting. °? Make sure you have little or no nausea before eating solid foods. °General instructions °· Have a responsible adult stay with you until you are awake and alert. °· Take over-the-counter and prescription medicines only as told by your health care provider. °· If you smoke, do not smoke without supervision. °· Keep all follow-up visits as told by your health care provider. This is important. °Contact a health care provider if: °· You keep feeling nauseous or you keep vomiting. °· You feel light-headed. °· You develop a rash. °· You have a fever. °Get help right away if: °· You have trouble breathing. °This information is not intended to replace advice given to you by your health care provider. Make sure you discuss any questions you have  with your health care provider. °Document Released: 10/17/2012 Document Revised: 06/01/2015 Document Reviewed: 04/18/2015 °Elsevier Interactive Patient Education © 2019 [employer] °Angiogram, Care After °This sheet gives you information about how to care for yourself after your procedure. Your doctor may also give you more specific instructions. If you have problems or questions, contact your doctor. °Follow these instructions at home: °Insertion site care °· Follow instructions from your doctor about how to take care of your long, thin tube (catheter) insertion area. Make sure you: °? Wash your hands with soap and water before you change your bandage (dressing). If you cannot use soap and water, use hand sanitizer. °? Change your bandage as told by your doctor. °? Leave stitches (sutures), skin glue, or skin tape (adhesive) strips in place. They may need to stay in place for 2 weeks or longer. If tape strips get loose and curl up, you may trim the loose edges. Do not remove tape strips completely unless your doctor says it is okay. °· Do not take baths, swim, or use a hot tub until your doctor says it is okay. °· You may shower 24-48 hours after the procedure or as told by your doctor. °? Gently wash the area with plain soap and water. °? Pat the area dry with a clean towel. °? Do not rub the area. This may cause bleeding. °· Do not apply powder or lotion to the area. Keep the area clean and dry. °· Check your insertion area every day for signs of infection. Check for: °? More redness, swelling, or pain. °?   Fluid or blood. °? Warmth. °? Pus or a bad smell. °Activity °· Rest as told by your doctor, usually for 1-2 days. °· Do not lift anything that is heavier than 10 lbs. (4.5 kg) or as told by your doctor. °· Do not drive for 24 hours if you were given a medicine to help you relax (sedative). °· Do not drive or use heavy machinery while taking prescription pain medicine. °General instructions ° °· Go back to  your normal activities as told by your doctor, usually in about a week. Ask your doctor what activities are safe for you. °· If the insertion area starts to bleed, lie flat and put pressure on the area. If the bleeding does not stop, get help right away. This is an emergency. °· Drink enough fluid to keep your pee (urine) clear or pale yellow. °· Take over-the-counter and prescription medicines only as told by your doctor. °· Keep all follow-up visits as told by your doctor. This is important. °Contact a doctor if: °· You have a fever. °· You have chills. °· You have more redness, swelling, or pain around your insertion area. °· You have fluid or blood coming from your insertion area. °· The insertion area feels warm to the touch. °· You have pus or a bad smell coming from your insertion area. °· You have more bruising around the insertion area. °· Blood collects in the tissue around the insertion area (hematoma) that may be painful to the touch. °Get help right away if: °· You have a lot of pain in the insertion area. °· The insertion area swells very fast. °· The insertion area is bleeding, and the bleeding does not stop after holding steady pressure on the area. °· The area near or just beyond the insertion area becomes pale, cool, tingly, or numb. °These symptoms may be an emergency. Do not wait to see if the symptoms will go away. Get medical help right away. Call your local emergency services (911 in the [place] Do not drive yourself to the hospital. °Summary °· After the procedure, it is common to have bruising and tenderness at the long, thin tube insertion area. °· After the procedure, it is important to rest and drink plenty of fluids. °· Do not take baths, swim, or use a hot tub until your doctor says it is okay to do so. You may shower 24-48 hours after the procedure or as told by your doctor. °· If the insertion area starts to bleed, lie flat and put pressure on the area. If the bleeding does not stop,  get help right away. This is an emergency. °This information is not intended to replace advice given to you by your health care provider. Make sure you discuss any questions you have with your health care provider. °Document Released: 03/25/2008 Document Revised: 12/22/2015 Document Reviewed: 12/22/2015 °Elsevier Interactive Patient Education © 2019 Elsevier Inc. ° °

## 2018-06-29 NOTE — Progress Notes (Signed)
D/c instructions reviewed with sister in law [place].  All questions answered and Sharyn Lull verbalized understanding.

## 2018-07-02 ENCOUNTER — Encounter (HOSPITAL_COMMUNITY): Payer: Self-pay | Admitting: Vascular Surgery

## 2018-07-02 ENCOUNTER — Telehealth: Payer: Self-pay | Admitting: Cardiology

## 2018-07-02 NOTE — Telephone Encounter (Signed)
smartphone/ my chart via emailed/ consent/ pre completed

## 2018-07-04 ENCOUNTER — Telehealth (HOSPITAL_COMMUNITY): Payer: Self-pay | Admitting: Rehabilitation

## 2018-07-04 NOTE — Progress Notes (Signed)
Virtual Visit via Video Note changed to phone visit at patient request.   This visit type was conducted due to national recommendations for restrictions regarding the COVID-19 Pandemic (e.g. social distancing) in an effort to limit this patient's exposure and mitigate transmission in our community.  Due to his co-morbid illnesses, this patient is at least at moderate risk for complications without adequate follow up.  This format is felt to be most appropriate for this patient at this time.  All issues noted in this document were discussed and addressed.  A limited physical exam was performed with this format.  Please refer to the patient's chart for his consent to telehealth for [facility].   Date:  07/05/2018   ID:  James Stevens, DOB 07/24/1960, MRN [id number]  Patient Location:Home Provider Location: Home  PCP:  Fleet ContrasAvbuere, Edwin, MD  Cardiologist:  Dr Jens Somrenshaw  Evaluation Performed:  Consultation - James Anchorsaniel J Rubenstein was referred by Fabienne Brunsharles Fields MD for [facility] evaluation [facility]  Chief Complaint:  Preoperative evaluation  History of Present Illness:    58 year old male for preoperative evaluation prior to right to left femorofemoral bypass.  No prior cardiac history.  Patient is status post aortobifemoral bypass grafting in 2015.  Recently underwent arteriogram and will require above procedure.  Cardiology asked to evaluate.  Patient denies dyspnea, chest pain, palpitations or syncope.  However he has severe claudication particularly on the left and can only ambulate 2 minutes without stopping.  The patient does not have symptoms concerning for COVID-19 infection (fever, chills, cough, or new shortness of breath).    Past Medical History:  Diagnosis Date  . Arthritis   . Bipolar affective (HCC)   . Chronic back pain    and right hip  . Depression   . GERD (gastroesophageal reflux disease)    occ tums  . Headache(784.0)    migraines  . Hip pain   .  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 Peripheral vascular disease (HCC)   . Schizophrenia [facility](HCC)    Past Surgical History:  Procedure Laterality Date  . ABDOMINAL AORTOGRAM W/LOWER EXTREMITY Bilateral 06/29/2018   Procedure: ABDOMINAL AORTOGRAM W/LOWER EXTREMITY;  Surgeon: Sherren KernsFields, Charles E, MD;  [facility];  Service: Cardiovascular;  Laterality: Bilateral;  . AORTA - BILATERAL FEMORAL ARTERY BYPASS GRAFT N/A 10/07/2013   Procedure: AORTA BIFEMORAL BYPASS GRAFT;  Surgeon: Sherren Kernsharles E Fields, MD;  [facility];  Service: Vascular;  Laterality: N/A;  . ESOPHAGEAL DILATION    . ESOPHAGOGASTRODUODENOSCOPY  09/06/2011   Procedure: ESOPHAGOGASTRODUODENOSCOPY (EGD);  Surgeon: Vertell NovakJames L Edwards Jr., MD;  [facility];  Service: Endoscopy;  Laterality: N/A;  . FRACTURE SURGERY     left fingers # 4,5  . JOINT REPLACEMENT Bilateral    rt hip replacement, and  LT hip  . LUMBAR LAMINECTOMY N/A 05/05/2014   Procedure: BILATERAL LUMBAR LAMINECTOMY L4-5 AND L5-S1;  Surgeon: Kerrin ChampagneJames E Nitka, MD;  [facility];  Service: Orthopedics;  Laterality: N/A;  . MANDIBLE FRACTURE SURGERY    . SAVORY DILATION  09/06/2011   Procedure: SAVORY DILATION;  Surgeon: Vertell NovakJames L Edwards Jr., MD;  [facility];  Service: Endoscopy;  Laterality: N/A;  . TOTAL HIP ARTHROPLASTY Left 04/17/2012   Procedure: LEFT TOTAL HIP ARTHROPLASTY ANTERIOR APPROACH;  Surgeon: Kathryne Hitchhristopher Y Blackman, MD;  [facility] OR;  Service: Orthopedics;  Laterality: Left;     Current  Meds  Medication Sig  . oxyCODONE-acetaminophen (PERCOCET/ROXICET) 5-325 MG tablet Take 1 tablet by mouth 2 (two) times a day.     Allergies:   Lyrica [pregabalin], Ibuprofen, and Tramadol   Social History   Tobacco Use  . Smoking status: Current Every Day Smoker    Packs/day: 0.20    Years: 34.00    Pack years: 6.80    Types: Cigarettes  . Smokeless tobacco: Never Used   . Tobacco comment: 3-4 cigarettes per day  Substance Use Topics  . Alcohol use: Yes    Comment: once a week   . Drug use: Yes    Frequency: 2.0 times per week    Types: Cocaine, Marijuana     Family Hx: The patient's family history includes Diabetes in his brother, father, mother, and another family member.  ROS:   Please see the history of present illness.    No Fever, chills  or productive cough All other systems reviewed and are negative.   Labs/Other Tests and Data Reviewed:    EKG: Electrocardiogram February 06, 2018 showed sinus rhythm, RV conduction delay, right atrial enlargement, no ST changes.  Recent Labs: 02/06/2018: ALT 18; Platelets 164 06/29/2018: BUN 13; Creatinine, Ser 1.20; Hemoglobin 16.0; Potassium 3.5; Sodium 140   Recent Lipid Panel Lab Results  Component Value Date/Time   CHOL 182 04/02/2009 08:25 PM   TRIG 88 04/02/2009 08:25 PM   HDL 43 04/02/2009 08:25 PM   CHOLHDL 4.2 Ratio 04/02/2009 08:25 PM   LDLCALC 121 (H) 04/02/2009 08:25 PM    Wt Readings from Last 3 Encounters:  07/05/18 143 lb (64.9 kg)  06/21/18 143 lb (64.9 kg)  01/04/18 143 lb (64.9 kg)     Objective:    Vital Signs:  Ht 5\' 10"  (1.778 m)   Wt 143 lb (64.9 kg)   BMI 20.52 kg/m    VITAL SIGNS:  reviewed NAD Answers questions appropriately Normal affect Remainder of physical examination not performed (telehealth visit; coronavirus pandemic)  ASSESSMENT & PLAN:    1. Preoperative evaluation prior to right to left femorofemoral bypass-patient with known vascular disease.  His peripheral vascular disease limits his mobility and functional capacity.  We will arrange a [place] nuclear study for risk stratification preoperatively.  He also has right atrial enlargement and RV conduction delay on ECG.  We will arrange echocardiogram to assess LV function.  If studies are low risk or normal patient may proceed with surgery. 2. Tobacco abuse-patient counseled on discontinuing. 3.  Peripheral vascular disease-add aspirin 81 mg daily.  Add Crestor 40 mg daily.  Check lipids and liver in 8 weeks. 4. History of substance abuse-patient counseled on avoiding cocaine.  COVID-19 Education: The importance of social distancing was discussed today.  Time:   Today, I have spent 22 minutes with the patient with telehealth technology discussing the above problems.    Medication Adjustments/Labs and Tests Ordered: Current medicines are reviewed at length with the patient today.  Concerns regarding medicines are outlined above.   Tests Ordered: No orders of the defined types were placed in this encounter.   Medication Changes: No orders of the defined types were placed in this encounter.   Follow Up:  Virtual Visit or In Person in 6 month(s)  Signed, Kirk Ruths, MD  07/05/2018 8:39 AM    [place]

## 2018-07-04 NOTE — Telephone Encounter (Signed)
I called pt to confirm appointment  for 07-05-18 with Dr Stanford Breed.

## 2018-07-04 NOTE — Telephone Encounter (Signed)

## 2018-07-05 ENCOUNTER — Telehealth (INDEPENDENT_AMBULATORY_CARE_PROVIDER_SITE_OTHER): Payer: Medicaid Other | Admitting: Cardiology

## 2018-07-05 ENCOUNTER — Encounter: Payer: Self-pay | Admitting: Cardiology

## 2018-07-05 ENCOUNTER — Encounter: Payer: Self-pay | Admitting: *Deleted

## 2018-07-05 ENCOUNTER — Ambulatory Visit (INDEPENDENT_AMBULATORY_CARE_PROVIDER_SITE_OTHER): Payer: Medicaid Other | Admitting: Vascular Surgery

## 2018-07-05 ENCOUNTER — Other Ambulatory Visit: Payer: Self-pay

## 2018-07-05 ENCOUNTER — Encounter: Payer: Self-pay | Admitting: Vascular Surgery

## 2018-07-05 VITALS — BP 133/86 | HR 70 | Temp 97.7°F | Resp 20 | Ht 70.0 in | Wt 143.0 lb

## 2018-07-05 VITALS — Ht 70.0 in | Wt 143.0 lb

## 2018-07-05 DIAGNOSIS — R9431 Abnormal electrocardiogram [ECG] [EKG]: Secondary | ICD-10-CM

## 2018-07-05 DIAGNOSIS — I739 Peripheral vascular disease, unspecified: Secondary | ICD-10-CM | POA: Diagnosis not present

## 2018-07-05 DIAGNOSIS — Z01818 Encounter for other preprocedural examination: Secondary | ICD-10-CM

## 2018-07-05 DIAGNOSIS — E7800 Pure hypercholesterolemia, unspecified: Secondary | ICD-10-CM

## 2018-07-05 MED ORDER — ASPIRIN EC 81 MG PO TBEC: 81.0000 mg | DELAYED_RELEASE_TABLET | Freq: Every day | ORAL | 3 refills | Status: DC

## 2018-07-05 MED ORDER — ROSUVASTATIN CALCIUM 40 MG PO TABS: 40.0000 mg | ORAL_TABLET | Freq: Every day | ORAL | 3 refills | Status: DC

## 2018-07-05 NOTE — Patient Instructions (Signed)
Medication Instructions:  START ASPIRIN 81 MG ONCE DAILY  START ROSUVASTATIN 40 MG ONCE DAILY  If you need a refill on your cardiac medications before your next appointment, please call your pharmacy.   Lab work: Your physician recommends that you return for lab work in: [place]  If you have labs (blood work) drawn today and your tests are completely normal, you will receive your results only by: Marland Kitchen MyChart Message (if you have MyChart) OR . A paper copy in the mail If you have any lab test that is abnormal or we need to change your treatment, we will call you to review the results.  Testing/Procedures: Your physician has requested that you have an echocardiogram. Echocardiography is a painless test that uses sound waves to create images of your heart. It provides your doctor with information about the size and shape of your heart and how well your heart's chambers and valves are working. This procedure takes approximately one hour. There are no restrictions for this procedure.  [place] has requested that you have a lexiscan myoview. For further information please visit [email or web address]. Please follow instruction sheet, as given.[place], you and your health needs are our priority.  As part of our continuing mission to provide you with exceptional heart care, we have created designated Provider Care Teams.  These Care Teams include your primary Cardiologist (physician) and Advanced Practice Providers (APPs -  Physician Assistants and Nurse Practitioners) who all work together to provide you with the care you need, when you need it. You will need a follow up appointment in 6 months.  Please call our office 2 months in advance to schedule this appointment.  You may see Kirk Ruths MD or one of the following Advanced Practice Providers on your designated Care Team:   Kerin Ransom, PA-C Roby Lofts, [place] . Sande Rives, PA-C

## 2018-07-05 NOTE — Progress Notes (Signed)
Patient is a 58 year old male who returns for follow-up today.  He previously underwent aortobifemoral bypass in 2015.  This was done for claudication.  Recently he has noted that his left leg symptoms have returned.  He can only walk very short distances before experiencing calf and thigh tightness in his left leg.  He is ambulatory but only for short distances.  He also has chronic back pain issues which limit his ambulation.  He recently had an arteriogram which showed occlusion of the left limb of his aortobifemoral bypass.  His arteriogram last week showed so a 50% stenosis of the left superficial femoral artery with patent popliteal and three-vessel runoff the right limb of the aortobifem was widely patent with a mid right SFA occlusion and three-vessel runoff to the right foot.  His symptoms have not changed since his arteriogram.  He saw Dr. Jens Somrenshaw this morning who has ordered an echocardiogram and a stress test preoperatively.  He is on aspirin and a statin.  He is scheduled for an echocardiogram and a stress test July 18, 2018.  Past Medical History:  Diagnosis Date  . Arthritis   . Bipolar affective (HCC)   . Chronic back pain    and right hip  . Depression   . GERD (gastroesophageal reflux disease)    occ tums  . Headache(784.0)    migraines  . Hip pain   . 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 Peripheral vascular disease (HCC)   . Schizophrenia [facility](HCC)     Past Surgical History:  Procedure Laterality Date  . ABDOMINAL AORTOGRAM W/LOWER EXTREMITY Bilateral 06/29/2018   Procedure: ABDOMINAL AORTOGRAM W/LOWER EXTREMITY;  Surgeon: Sherren KernsFields, Nicolo Tomko E, MD;  [facility];  Service: Cardiovascular;  Laterality: Bilateral;  . AORTA - BILATERAL FEMORAL ARTERY BYPASS GRAFT N/A 10/07/2013   Procedure: AORTA BIFEMORAL BYPASS GRAFT;  Surgeon: Sherren Kernsharles E Naisha Wisdom, MD;  [facility];  Service: Vascular;  Laterality: N/A;   . ESOPHAGEAL DILATION    . ESOPHAGOGASTRODUODENOSCOPY  09/06/2011   Procedure: ESOPHAGOGASTRODUODENOSCOPY (EGD);  Surgeon: Vertell NovakJames L Edwards Jr., MD;  [facility];  Service: Endoscopy;  Laterality: N/A;  . FRACTURE SURGERY     left fingers # 4,5  . JOINT REPLACEMENT Bilateral    rt hip replacement, and  LT hip  . LUMBAR LAMINECTOMY N/A 05/05/2014   Procedure: BILATERAL LUMBAR LAMINECTOMY L4-5 AND L5-S1;  Surgeon: Kerrin ChampagneJames E Nitka, MD;  [facility];  Service: Orthopedics;  Laterality: N/A;  . MANDIBLE FRACTURE SURGERY    . SAVORY DILATION  09/06/2011   Procedure: SAVORY DILATION;  Surgeon: Vertell NovakJames L Edwards Jr., MD;  [facility];  Service: Endoscopy;  Laterality: N/A;  . TOTAL HIP ARTHROPLASTY Left 04/17/2012   Procedure: LEFT TOTAL HIP ARTHROPLASTY ANTERIOR APPROACH;  Surgeon: Kathryne Hitchhristopher Y Blackman, MD;  [facility];  Service: Orthopedics;  Laterality: Left;    Social History   Socioeconomic History  . Marital status: Divorced    Spouse name: Not on file  . Number of children: 1  . Years of education: Not on file  . Highest education level: Not on file  Occupational History  . Not on file  Social Needs  . Financial resource strain: Not on file  . Food insecurity    Worry: Not on file    Inability: Not on file  . Transportation needs    Medical: Not on file    Non-medical:  Not on file  Tobacco Use  . Smoking status: Current Every Day Smoker    Packs/day: 0.20    Years: 34.00    Pack years: 6.80    Types: Cigarettes  . Smokeless tobacco: Never Used  . Tobacco comment: 3-4 cigarettes per day  Substance and Sexual Activity  . Alcohol use: Yes    Comment: once a week   . Drug use: Yes    Frequency: 2.0 times per week    Types: Cocaine, Marijuana  . Sexual activity: Not on file  Lifestyle  . Physical activity    Days per week: Not on file    Minutes per session: Not on file  . Stress: Not on file  Relationships  . Social Herbalist on  phone: Not on file    Gets together: Not on file    Attends religious service: Not on file    Active member of club or organization: Not on file    Attends meetings of clubs or organizations: Not on file    Relationship status: Not on file  . Intimate partner violence    Fear of current or ex partner: Not on file    Emotionally abused: Not on file    Physically abused: Not on file    Forced sexual activity: Not on file  Other Topics Concern  . Not on file  Social History Narrative  . Not on file    Current Outpatient Medications on File Prior to Visit  Medication Sig Dispense Refill  . aspirin EC 81 MG tablet Take 1 tablet (81 mg total) by mouth daily. 90 tablet 3  . oxyCODONE-acetaminophen (PERCOCET/ROXICET) 5-325 MG tablet Take 1 tablet by mouth 2 (two) times a day.    . rosuvastatin (CRESTOR) 40 MG tablet Take 1 tablet (40 mg total) by mouth daily. 90 tablet 3   Current Facility-Administered Medications on File Prior to Visit  Medication Dose Route Frequency Provider Last Rate Last Dose  . lactated ringers infusion    Continuous PRN Harden Mo, CRNA      . lactated ringers infusion    Continuous PRN Harden Mo, CRNA        Allergies  Allergen Reactions  . Lyrica [Pregabalin] Other (See Comments)    anxiety  . Ibuprofen Other (See Comments)    Gastric irritation  . 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  If these are all reasonable we will plan on scheduling his femoral-femoral bypass soon after that.  Ruta Hinds, MD [facility] Office: [phone] Pager: [phone]

## 2018-07-12 ENCOUNTER — Telehealth (HOSPITAL_COMMUNITY): Payer: Self-pay | Admitting: *Deleted

## 2018-07-12 NOTE — Telephone Encounter (Signed)
Patient given detailed instructions per Myocardial Perfusion Study Information Sheet for the test on 07/18/18 at 11:15. Patient notified to arrive 15 minutes early and that it is imperative to arrive on time for appointment to keep from having the test rescheduled.  If you need to cancel or reschedule your appointment, please call the office within 24 hours of your appointment. . Patient verbalized understanding.James Stevens

## 2018-07-16 ENCOUNTER — Encounter (HOSPITAL_COMMUNITY): Payer: Medicaid Other

## 2018-07-18 ENCOUNTER — Other Ambulatory Visit: Payer: Self-pay

## 2018-07-18 ENCOUNTER — Encounter (HOSPITAL_COMMUNITY): Payer: Self-pay

## 2018-07-18 ENCOUNTER — Ambulatory Visit (HOSPITAL_COMMUNITY): Payer: Medicaid Other | Attending: Cardiovascular Disease

## 2018-07-18 ENCOUNTER — Ambulatory Visit (HOSPITAL_BASED_OUTPATIENT_CLINIC_OR_DEPARTMENT_OTHER): Payer: Medicaid Other

## 2018-07-18 DIAGNOSIS — Z01818 Encounter for other preprocedural examination: Secondary | ICD-10-CM

## 2018-07-18 DIAGNOSIS — R9431 Abnormal electrocardiogram [ECG] [EKG]: Secondary | ICD-10-CM

## 2018-07-18 LAB — MYOCARDIAL PERFUSION IMAGING
LV dias vol: 78 mL (ref 62–150)
LV sys vol: 45 mL
Peak HR: 100 {beats}/min
Rest HR: 61 {beats}/min
SDS: 0
SRS: 0
SSS: 0
TID: 1.05

## 2018-07-18 LAB — ECHOCARDIOGRAM COMPLETE
Height: 70 in
Weight: 2288 oz

## 2018-07-18 MED ORDER — TECHNETIUM TC 99M TETROFOSMIN IV KIT
32.4000 | PACK | Freq: Once | INTRAVENOUS | Status: AC | PRN
  Administered 2018-07-18: 32.4 via INTRAVENOUS
  Filled 2018-07-18: qty 33

## 2018-07-18 MED ORDER — TECHNETIUM TC 99M TETROFOSMIN IV KIT
10.5000 | PACK | Freq: Once | INTRAVENOUS | Status: AC | PRN
  Administered 2018-07-18: 10.5 via INTRAVENOUS
  Filled 2018-07-18: qty 11

## 2018-07-18 MED ORDER — REGADENOSON 0.4 MG/5ML IV SOLN
0.4000 mg | Freq: Once | INTRAVENOUS | Status: AC
  Administered 2018-07-18: 0.4 mg via INTRAVENOUS

## 2018-07-23 ENCOUNTER — Other Ambulatory Visit: Payer: Self-pay | Admitting: *Deleted

## 2018-07-23 ENCOUNTER — Encounter: Payer: Self-pay | Admitting: *Deleted

## 2018-07-25 ENCOUNTER — Telehealth (HOSPITAL_COMMUNITY): Payer: Self-pay | Admitting: Rehabilitation

## 2018-07-25 NOTE — Telephone Encounter (Signed)

## 2018-07-26 ENCOUNTER — Other Ambulatory Visit: Payer: Self-pay

## 2018-07-26 ENCOUNTER — Telehealth: Payer: Self-pay | Admitting: *Deleted

## 2018-07-26 ENCOUNTER — Encounter: Payer: Self-pay | Admitting: Vascular Surgery

## 2018-07-26 ENCOUNTER — Ambulatory Visit (INDEPENDENT_AMBULATORY_CARE_PROVIDER_SITE_OTHER): Payer: Medicaid Other | Admitting: Vascular Surgery

## 2018-07-26 DIAGNOSIS — I739 Peripheral vascular disease, unspecified: Secondary | ICD-10-CM | POA: Diagnosis not present

## 2018-07-26 NOTE — Telephone Encounter (Signed)
Virtual Visit Pre-Appointment Phone Call  Today, I spoke with James Stevens and performed the following actions:  1. I explained that we are currently trying to limit exposure to the COVID-19 virus by seeing patients at home rather than in the office.  I explained that the visits are best done by video, but can be done by telephone.  I asked the patient if a virtual visit that the patient would like to try instead of coming into the office. James Stevens agreed to proceed with the virtual visit scheduled with Ruta Hinds on 07/26/18.    2. I confirmed the BEST phone number to call the day of the visit and- I included this in appointment notes.  3. I asked if the patient had access to (through a family member/friend) a smartphone with video capability to be used for his visit?"  The patient said yes -    4. I confirmed consent by  a. sending through [place] or by email the [place] as written at the end of this message or  b. verbally as listed below. i. This visit is being performed in the setting of COVID-19. ii. All virtual visits are billed to your insurance company just like a normal visit would be.   iii. We'd like you to understand that the technology does not allow for your provider to perform an examination, and thus may limit your provider's ability to fully assess your condition.  iv. If your provider identifies any concerns that need to be evaluated in person, we will make arrangements to do so.   v. Finally, though the technology is pretty good, we cannot assure that it will always work on either your or our end, and in the setting of a video visit, we may have to convert it to a phone-only visit.  In either situation, we cannot ensure that we have a secure connection.   vi. Are you willing to proceed?"  STAFF: Did the patient verbally acknowledge consent to telehealth visit? Document YES/NO here: YES  2. I advised the patient to be  prepared - I asked that the patient, on the day of his visit, record any information possible with the equipment at his home, such as blood pressure, pulse, oxygen saturation, and your weight and write them all down. I asked the patient to have a pen and paper handy nearby the day of the visit as well.  3. If the patient was scheduled for a video visit, I informed the patient that the visit with the doctor would start with a text to the smartphone # given to [name] by the patient.         If the patient was scheduled for a telephone call, I informed the patient that the visit with the doctor would start with a call to the telephone # given to [name] by the patient.  4. I Informed patient they will receive a phone call 15 minutes prior to their appointment time from a [place] or nurse to review medications, allergies, etc. to prepare for the visit.    TELEPHONE CALL NOTE  ORDEAN FOUTS has been deemed a candidate for a follow-up tele-health visit to limit community exposure during the Covid-19 pandemic. I spoke with the patient via phone to ensure availability of phone/video source, confirm preferred email & phone number, and discuss instructions and expectations.  I reminded James Stevens to be prepared with any vital sign and/or heart  rhythm information that could potentially be obtained via home monitoring, at the time of his visit. I reminded James Stevens to expect a phone call prior to his visit.  James Stevens, James Stevens M, NT 07/26/2018 10:21 AM     FULL LENGTH CONSENT FOR TELE-HEALTH VISIT   I hereby voluntarily request, consent and authorize [facility] and its employed or contracted physicians, physician assistants, nurse practitioners or other licensed health care professionals (the Practitioner), to provide me with telemedicine health care services (the "Services") as deemed necessary by the treating Practitioner. I acknowledge and consent to receive the Services by the Practitioner via  telemedicine. I understand that the telemedicine visit will involve communicating with the Practitioner through live audiovisual communication technology and the disclosure of certain medical information by electronic transmission. I acknowledge that I have been given the opportunity to request an in-person assessment or other available alternative prior to the telemedicine visit and am voluntarily participating in the telemedicine visit.  I understand that I have the right to withhold or withdraw my consent to the use of telemedicine in the course of my care at any time, without affecting my right to future care or treatment, and that the Practitioner or I may terminate the telemedicine visit at any time. I understand that I have the right to inspect all information obtained and/or recorded in the course of the telemedicine visit and may receive copies of available information for a reasonable fee.  I understand that some of the potential risks of receiving the Services via telemedicine include:  [place]. Delay or interruption in medical evaluation due to technological equipment failure or disruption; . Information transmitted may not be sufficient (e.g. poor resolution of images) to allow for appropriate medical decision making by the Practitioner; and/or  . In rare instances, security protocols could fail, causing a breach of personal health information.  Furthermore, I acknowledge that it is my responsibility to provide information about my medical history, conditions and care that is complete and accurate to the best of my ability. I acknowledge that Practitioner's advice, recommendations, and/or decision may be based on factors not within their control, such as incomplete or inaccurate data provided by me or distortions of diagnostic images or specimens that may result from electronic transmissions. I understand that the practice of medicine is not an exact science and that Practitioner makes no warranties or  guarantees regarding treatment outcomes. I acknowledge that I will receive a copy of this consent concurrently upon execution via email to the email address I last provided but may also request a printed copy by calling the office of [facility].    I understand that my insurance will be billed for this visit.   I have read or had this consent read to me. . I understand the contents of this consent, which adequately explains the benefits and risks of the Services being provided via telemedicine.  . I have been provided ample opportunity to ask questions regarding this consent and the Services and have had my questions answered to my satisfaction. . I give my informed consent for the services to be provided through the use of telemedicine in my medical care  By participating in this telemedicine visit I agree to the above.

## 2018-07-26 NOTE — Telephone Encounter (Signed)
Reviewed pre-op instructions with patient over the phone. Will mail out instruction letter to patient. Verbalized understanding.

## 2018-07-26 NOTE — H&P (View-Only) (Signed)
Virtual Visit via Telephone Note   I connected with James Stevens on 07/26/2018 using the telephone and verified that I was speaking with the correct person using two identifiers. Patient was located at home and accompanied by no one. I am located at our office on [address].  This visit was done as a telephone visit as the patient did not have technology capable for video platform..   The limitations of evaluation and management by telemedicine and the availability of in person appointments have been previously discussed with the patient and are documented in the patients chart. The patient expressed understanding and consented to proceed.    Observations/Objective:   I discussed the assessment and treatment plan with the patient. The patient was provided an opportunity to ask questions and all were answered. The patient agreed with the plan and demonstrated an understanding of the instructions.   The patient was advised to call back or seek an in-person evaluation if the symptoms worsen or if the condition fails to improve as anticipated.  I spent 10 minutes with the patient via telephone encounter.  Patient is a 58 year old male status post aortobifemoral bypass graft 2015.  He recently noticed his left leg was becoming weak again.  He had an arteriogram recently that showed the left limb of his aortobifemoral bypass graft is now occluded.  Visit today was to discuss further the test results of his cardiac testing as well as operative plan.  His recent cardiac stress test was negative.  He is on aspirin and statin.  He still has claudication in the left leg but no rest pain.  He has overall weakness due to back issues as well and is in a wheelchair.  He does use his legs to transfer.  He states that he thinks he would be able to walk further if he was able to have improved strength in his left leg.  View of systems: He has no shortness of breath.  He has no chest pain.  He is having  intermittent diarrhea.  Past Medical History:  Diagnosis Date  . Arthritis   . Bipolar affective (HCC)   . Chronic back pain    and right hip  . Depression   . GERD (gastroesophageal reflux disease)    occ tums  . Headache(784.0)    migraines  . Hip pain   . 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 Peripheral vascular disease (HCC)   . Schizophrenia [facility](HCC)     Past Surgical History:  Procedure Laterality Date  . ABDOMINAL AORTOGRAM W/LOWER EXTREMITY Bilateral 06/29/2018   Procedure: ABDOMINAL AORTOGRAM W/LOWER EXTREMITY;  Surgeon: Sherren KernsFields, Charles E, MD;  [facility];  Service: Cardiovascular;  Laterality: Bilateral;  . AORTA - BILATERAL FEMORAL ARTERY BYPASS GRAFT N/A 10/07/2013   Procedure: AORTA BIFEMORAL BYPASS GRAFT;  Surgeon: Sherren Kernsharles E Fields, MD;  [facility];  Service: Vascular;  Laterality: N/A;  . ESOPHAGEAL DILATION    . ESOPHAGOGASTRODUODENOSCOPY  09/06/2011   Procedure: ESOPHAGOGASTRODUODENOSCOPY (EGD);  Surgeon: Vertell NovakJames L Edwards Jr., MD;  [facility];  Service: Endoscopy;  Laterality: N/A;  . FRACTURE SURGERY     left fingers # 4,5  . JOINT REPLACEMENT Bilateral    rt hip replacement, and  LT hip  . LUMBAR LAMINECTOMY N/A 05/05/2014   Procedure: BILATERAL LUMBAR LAMINECTOMY L4-5 AND L5-S1;  Surgeon: Kerrin ChampagneJames E Nitka, MD;  [facility];  Service:  Orthopedics;  Laterality: N/A;  . MANDIBLE FRACTURE SURGERY    . SAVORY DILATION  09/06/2011   Procedure: SAVORY DILATION;  Surgeon: Winfield Cunas., MD;  [facility];  Service: Endoscopy;  Laterality: N/A;  . TOTAL HIP ARTHROPLASTY Left 04/17/2012   Procedure: LEFT TOTAL HIP ARTHROPLASTY ANTERIOR APPROACH;  Surgeon: Mcarthur Rossetti, MD;  Location: [facility];  Service: Orthopedics;  Laterality: Left;    Current Outpatient Medications on File Prior to Visit  Medication Sig Dispense Refill  . aspirin EC 81 MG tablet Take 1  tablet (81 mg total) by mouth daily. 90 tablet 3  . oxyCODONE-acetaminophen (PERCOCET/ROXICET) 5-325 MG tablet Take 1 tablet by mouth 2 (two) times a day.    . rosuvastatin (CRESTOR) 40 MG tablet Take 1 tablet (40 mg total) by mouth daily. 90 tablet 3   Current Facility-Administered Medications on File Prior to Visit  Medication Dose Route Frequency Provider Last Rate Last Dose  . lactated ringers infusion    Continuous PRN Harden Mo, CRNA      . lactated ringers infusion    Continuous PRN Harden Mo, CRNA        Social History   Socioeconomic History  . Marital status: Divorced    Spouse name: Not on file  . Number of children: 1  . Years of education: Not on file  . Highest education level: Not on file  Occupational History  . Not on file  Social Needs  . Financial resource strain: Not on file  . Food insecurity    Worry: Not on file    Inability: Not on file  . Transportation needs    Medical: Not on file    Non-medical: Not on file  Tobacco Use  . Smoking status: Current Every Day Smoker    Packs/day: 0.20    Years: 34.00    Pack years: 6.80    Types: Cigarettes  . Smokeless tobacco: Never Used  . Tobacco comment: 3-4 cigarettes per day  Substance and Sexual Activity  . Alcohol use: Yes    Comment: once a week   . Drug use: Yes    Frequency: 2.0 times per week    Types: Cocaine, Marijuana  . Sexual activity: Not on file  Lifestyle  . Physical activity    Days per week: Not on file    Minutes per session: Not on file  . Stress: Not on file  Relationships  . Social Herbalist on phone: Not on file    Gets together: Not on file    Attends religious service: Not on file    Active member of club or organization: Not on file    Attends meetings of clubs or organizations: Not on file    Relationship status: Not on file  . Intimate partner violence    Fear of current or ex partner: Not on file    Emotionally abused: Not on file    Physically  abused: Not on file    Forced sexual activity: Not on file  Other Topics Concern  . Not on file  Social History Narrative  . 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  Risk benefits possible complications of procedure details were discussed with patient today including but not limited to bleeding infection graft occlusion myocardial events  risk of transfusion.  He understands and agrees to proceed.  He has had a GI illness recently and we discussed that he would need to be completely over this and have it resolved prior to his operation.  Charles Fields, MD [facility] [place] Office: [phone] Pager: [phone]  

## 2018-07-26 NOTE — Progress Notes (Signed)
Virtual Visit via Telephone Note   I connected with James Stevens on 07/26/2018 using the telephone and verified that I was speaking with the correct person using two identifiers. Patient was located at home and accompanied by no one. I am located at our office on [address].  This visit was done as a telephone visit as the patient did not have technology capable for video platform..   The limitations of evaluation and management by telemedicine and the availability of in person appointments have been previously discussed with the patient and are documented in the patients chart. The patient expressed understanding and consented to proceed.    Observations/Objective:   I discussed the assessment and treatment plan with the patient. The patient was provided an opportunity to ask questions and all were answered. The patient agreed with the plan and demonstrated an understanding of the instructions.   The patient was advised to call back or seek an in-person evaluation if the symptoms worsen or if the condition fails to improve as anticipated.  I spent 10 minutes with the patient via telephone encounter.  Patient is a 58 year old male status post aortobifemoral bypass graft 2015.  He recently noticed his left leg was becoming weak again.  He had an arteriogram recently that showed the left limb of his aortobifemoral bypass graft is now occluded.  Visit today was to discuss further the test results of his cardiac testing as well as operative plan.  His recent cardiac stress test was negative.  He is on aspirin and statin.  He still has claudication in the left leg but no rest pain.  He has overall weakness due to back issues as well and is in a wheelchair.  He does use his legs to transfer.  He states that he thinks he would be able to walk further if he was able to have improved strength in his left leg.  View of systems: He has no shortness of breath.  He has no chest pain.  He is having  intermittent diarrhea.  Past Medical History:  Diagnosis Date  . Arthritis   . Bipolar affective (HCC)   . Chronic back pain    and right hip  . Depression   . GERD (gastroesophageal reflux disease)    occ tums  . Headache(784.0)    migraines  . Hip pain   . 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 Peripheral vascular disease (HCC)   . Schizophrenia [facility](HCC)     Past Surgical History:  Procedure Laterality Date  . ABDOMINAL AORTOGRAM W/LOWER EXTREMITY Bilateral 06/29/2018   Procedure: ABDOMINAL AORTOGRAM W/LOWER EXTREMITY;  Surgeon: Sherren Kerns,  E, MD;  [facility];  Service: Cardiovascular;  Laterality: Bilateral;  . AORTA - BILATERAL FEMORAL ARTERY BYPASS GRAFT N/A 10/07/2013   Procedure: AORTA BIFEMORAL BYPASS GRAFT;  Surgeon: Sherren Kernsharles E , MD;  [facility];  Service: Vascular;  Laterality: N/A;  . ESOPHAGEAL DILATION    . ESOPHAGOGASTRODUODENOSCOPY  09/06/2011   Procedure: ESOPHAGOGASTRODUODENOSCOPY (EGD);  Surgeon: Vertell NovakJames L Edwards Jr., MD;  [facility];  Service: Endoscopy;  Laterality: N/A;  . FRACTURE SURGERY     left fingers # 4,5  . JOINT REPLACEMENT Bilateral    rt hip replacement, and  LT hip  . LUMBAR LAMINECTOMY N/A 05/05/2014   Procedure: BILATERAL LUMBAR LAMINECTOMY L4-5 AND L5-S1;  Surgeon: Kerrin ChampagneJames E Nitka, MD;  [facility];  Service:  Orthopedics;  Laterality: N/A;  . MANDIBLE FRACTURE SURGERY    . SAVORY DILATION  09/06/2011   Procedure: SAVORY DILATION;  Surgeon: Winfield Cunas., MD;  [facility];  Service: Endoscopy;  Laterality: N/A;  . TOTAL HIP ARTHROPLASTY Left 04/17/2012   Procedure: LEFT TOTAL HIP ARTHROPLASTY ANTERIOR APPROACH;  Surgeon: Mcarthur Rossetti, MD;  Location: [facility];  Service: Orthopedics;  Laterality: Left;    Current Outpatient Medications on File Prior to Visit  Medication Sig Dispense Refill  . aspirin EC 81 MG tablet Take 1  tablet (81 mg total) by mouth daily. 90 tablet 3  . oxyCODONE-acetaminophen (PERCOCET/ROXICET) 5-325 MG tablet Take 1 tablet by mouth 2 (two) times a day.    . rosuvastatin (CRESTOR) 40 MG tablet Take 1 tablet (40 mg total) by mouth daily. 90 tablet 3   Current Facility-Administered Medications on File Prior to Visit  Medication Dose Route Frequency Provider Last Rate Last Dose  . lactated ringers infusion    Continuous PRN Harden Mo, CRNA      . lactated ringers infusion    Continuous PRN Harden Mo, CRNA        Social History   Socioeconomic History  . Marital status: Divorced    Spouse name: Not on file  . Number of children: 1  . Years of education: Not on file  . Highest education level: Not on file  Occupational History  . Not on file  Social Needs  . Financial resource strain: Not on file  . Food insecurity    Worry: Not on file    Inability: Not on file  . Transportation needs    Medical: Not on file    Non-medical: Not on file  Tobacco Use  . Smoking status: Current Every Day Smoker    Packs/day: 0.20    Years: 34.00    Pack years: 6.80    Types: Cigarettes  . Smokeless tobacco: Never Used  . Tobacco comment: 3-4 cigarettes per day  Substance and Sexual Activity  . Alcohol use: Yes    Comment: once a week   . Drug use: Yes    Frequency: 2.0 times per week    Types: Cocaine, Marijuana  . Sexual activity: Not on file  Lifestyle  . Physical activity    Days per week: Not on file    Minutes per session: Not on file  . Stress: Not on file  Relationships  . Social Herbalist on phone: Not on file    Gets together: Not on file    Attends religious service: Not on file    Active member of club or organization: Not on file    Attends meetings of clubs or organizations: Not on file    Relationship status: Not on file  . Intimate partner violence    Fear of current or ex partner: Not on file    Emotionally abused: Not on file    Physically  abused: Not on file    Forced sexual activity: Not on file  Other Topics Concern  . Not on file  Social History Narrative  . 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  Risk benefits possible complications of procedure details were discussed with patient today including but not limited to bleeding infection graft occlusion myocardial events  risk of transfusion.  He understands and agrees to proceed.  He has had a GI illness recently and we discussed that he would need to be completely over this and have it resolved prior to his operation.  Fabienne Brunsharles , MD [facility] [place] Office: [phone] Pager: [phone]

## 2018-08-01 NOTE — Progress Notes (Signed)
[facility] - [place], [place] - [address] [place] [id number] Phone: [phone] Fax: [phone]  [id number] Ginette Otto- [place], [place] - [address] AT [facility] [address] [place] [id number] Phone: [phone] Fax: [phone]    Your procedure is scheduled on Monday, July 27th  Report to [facility] Main Entrance "A" at 5:30 A.M., and check in at the Admitting office.  Call this number if you have problems the morning of surgery:  [phone]  Call [phone] if you have any questions prior to your surgery date Monday-Friday 8am-4pm   Remember:  Do not eat or drink after midnight the night before your surgery    Take these medicines the morning of surgery with A SIP OF WATER   If needed- oxyCODONE-acetaminophen (PERCOCET/ROXICET)   7 days prior to surgery STOP taking any Aspirin (unless otherwise instructed by your surgeon), Aleve, Naproxen, Ibuprofen, Motrin, Advil, Goody's, BC's, all herbal medications, fish oil, and all vitamins.   The Morning of Surgery  Do not wear jewelry, make-up or nail polish.  Do not wear lotions, powders, or perfumes/colognes, or deodorant  Do not shave 48 hours prior to surgery.  Men may shave face and neck.  Do not bring valuables to the hospital.  [facility] is not responsible for any belongings or valuables.  If you are a smoker, DO NOT Smoke 24 hours prior to surgery IF you wear a CPAP at night please bring your mask, tubing, and machine the morning of surgery   Remember that you must have someone to transport you home after your surgery, and remain with you for 24 hours if you are discharged the same day.  Contacts, glasses, hearing aids, dentures or bridgework may not be worn into surgery.   Leave your suitcase in the car.  After surgery it may be brought to your room.  For patients admitted to the hospital, discharge time will be determined by your  treatment team.  Patients discharged the day of surgery will not be allowed to drive home.   Special instructions:   [facility]- Preparing For Surgery  Before surgery, you can play an important role. Because skin is not sterile, your skin needs to be as free of germs as possible. You can reduce the number of germs on your skin by washing with CHG (chlorahexidine gluconate) Soap before surgery.  CHG is an antiseptic cleaner which kills germs and bonds with the skin to continue killing germs even after washing.    Oral Hygiene is also important to reduce your risk of infection.  Remember - BRUSH YOUR TEETH THE MORNING OF SURGERY WITH YOUR REGULAR TOOTHPASTE  Please do not use if you have an allergy to CHG or antibacterial soaps. If your skin becomes reddened/irritated stop using the CHG.  Do not shave (including legs and underarms) for at least 48 hours prior to first CHG shower. It is OK to shave your face.  Please follow these instructions carefully.   1. Shower the NIGHT BEFORE SURGERY and the MORNING OF SURGERY with CHG Soap.   2. If you chose to wash your hair, wash your hair first as usual with your normal shampoo.  3. After you shampoo, rinse your hair and body thoroughly to remove the shampoo.  4. Use CHG as you would any other liquid soap. You can apply CHG directly to the skin and wash gently with a scrungie  or a clean washcloth.   5. Apply the CHG Soap to your body ONLY FROM THE NECK DOWN.  Do not use on open wounds or open sores. Avoid contact with your eyes, ears, mouth and genitals (private parts). Wash Face and genitals (private parts)  with your normal soap.   6. Wash thoroughly, paying special attention to the area where your surgery will be performed.  7. Thoroughly rinse your body with warm water from the neck down.  8. DO NOT shower/wash with your normal soap after using and rinsing off the CHG Soap.  9. Pat yourself dry with a CLEAN TOWEL.  10. Wear CLEAN PAJAMAS  to bed the night before surgery, wear comfortable clothes the morning of surgery  11. Place CLEAN SHEETS on your bed the night of your first shower and DO NOT SLEEP WITH PETS.  Day of Surgery: Do not apply any deodorants/lotions. Please shower the morning of surgery with the CHG soap  Please wear clean clothes to the hospital/surgery center.   Remember to brush your teeth WITH YOUR REGULAR TOOTHPASTE.  Please read over the following fact sheets that you were given.

## 2018-08-02 ENCOUNTER — Other Ambulatory Visit (HOSPITAL_COMMUNITY)
Admission: RE | Admit: 2018-08-02 | Discharge: 2018-08-02 | Disposition: A | Discharge status: Home / Self Care | Payer: Medicaid Other | Source: Ambulatory Visit | Attending: Vascular Surgery | Admitting: Vascular Surgery

## 2018-08-02 ENCOUNTER — Encounter (HOSPITAL_COMMUNITY): Payer: Self-pay

## 2018-08-02 ENCOUNTER — Other Ambulatory Visit: Payer: Self-pay

## 2018-08-02 ENCOUNTER — Encounter (HOSPITAL_COMMUNITY)
Admission: RE | Admit: 2018-08-02 | Discharge: 2018-08-02 | Disposition: A | Discharge status: Home / Self Care | Payer: Medicaid Other | Source: Ambulatory Visit | Attending: Vascular Surgery | Admitting: Vascular Surgery

## 2018-08-02 DIAGNOSIS — Z1159 Encounter for screening for other viral diseases: Secondary | ICD-10-CM | POA: Diagnosis not present

## 2018-08-02 DIAGNOSIS — Z01812 Encounter for preprocedural laboratory examination: Secondary | ICD-10-CM | POA: Insufficient documentation

## 2018-08-02 LAB — COMPREHENSIVE METABOLIC PANEL
ALT: 20 U/L (ref 0–44)
AST: 21 U/L (ref 15–41)
Albumin: 3.9 g/dL (ref 3.5–5.0)
Alkaline Phosphatase: 56 U/L (ref 38–126)
Anion gap: 8 (ref 5–15)
BUN: 9 mg/dL (ref 6–20)
CO2: 25 mmol/L (ref 22–32)
Calcium: 9.4 mg/dL (ref 8.9–10.3)
Chloride: 109 mmol/L (ref 98–111)
Creatinine, Ser: 1.39 mg/dL — ABNORMAL HIGH (ref 0.61–1.24)
GFR calc Af Amer: >60 mL/min (ref >60)
GFR calc non Af Amer: 55 mL/min — ABNORMAL LOW (ref >60)
Glucose, Bld: 105 mg/dL — ABNORMAL HIGH (ref 70–99)
Potassium: 4.2 mmol/L (ref 3.5–5.1)
Sodium: 142 mmol/L (ref 135–145)
Total Bilirubin: 0.4 mg/dL (ref 0.3–1.2)
Total Protein: 6.8 g/dL (ref 6.5–8.1)

## 2018-08-02 LAB — TYPE AND SCREEN
ABO/RH(D): O POS
Antibody Screen: NEGATIVE

## 2018-08-02 LAB — URINALYSIS, ROUTINE W REFLEX MICROSCOPIC
Bacteria, UA: NONE SEEN
Bilirubin Urine: NEGATIVE
Glucose, UA: NEGATIVE mg/dL
Hgb urine dipstick: NEGATIVE
Ketones, ur: NEGATIVE mg/dL
Leukocytes,Ua: NEGATIVE
Nitrite: NEGATIVE
Protein, ur: 30 mg/dL — AB
Specific Gravity, Urine: 1.019 (ref 1.005–1.030)
pH: 5 (ref 5.0–8.0)

## 2018-08-02 LAB — APTT: aPTT: 34 seconds (ref 24–36)

## 2018-08-02 LAB — CBC
HCT: 47.6 % (ref 39.0–52.0)
Hemoglobin: 15.6 g/dL (ref 13.0–17.0)
MCH: 32.2 pg (ref 26.0–34.0)
MCHC: 32.8 g/dL (ref 30.0–36.0)
MCV: 98.3 fL (ref 80.0–100.0)
Platelets: 216 10*3/uL (ref 150–400)
RBC: 4.84 MIL/uL (ref 4.22–5.81)
RDW: 14.2 % (ref 11.5–15.5)
WBC: 7.2 10*3/uL (ref 4.0–10.5)
nRBC: 0 % (ref 0.0–0.2)

## 2018-08-02 LAB — SURGICAL PCR SCREEN
MRSA, PCR: NEGATIVE
Staphylococcus aureus: NEGATIVE

## 2018-08-02 LAB — SARS CORONAVIRUS 2 (TAT 6-24 HRS): SARS Coronavirus 2: NEGATIVE

## 2018-08-02 LAB — PROTIME-INR
INR: 1 (ref 0.8–1.2)
Prothrombin Time: 13.3 seconds (ref 11.4–15.2)

## 2018-08-02 NOTE — Progress Notes (Signed)
PCP - dr. Nolene Ebbs Cardiologist - Dr. Kirk Ruths  Chest x-ray - denies EKG - 02/07/2018 Stress Test - 07/18/2018 ECHO - 07/18/2018 Cardiac Cath - denies  Sleep Study - denies CPAP - N/A  Blood Thinner Instructions: N/A Aspirin Instructions: per patient not taking, instructed to follow-up with MD for further instructions.  Anesthesia review: YES, cardiac studies  Coronavirus Screening   Have you experienced the following symptoms:  Cough yes/no: No Fever (>100.56F)  yes/no: No Runny nose yes/no: No Sore throat yes/no: No Difficulty breathing/shortness of breath  yes/no: No  Have you or a family member traveled in the last 14 days and where? yes/no: No  If the patient indicates "YES" to the above questions, their PAT will be rescheduled to limit the exposure to others and, the surgeon will be notified. THE PATIENT WILL NEED TO BE ASYMPTOMATIC FOR 14 DAYS.   If the patient is not experiencing any of these symptoms, the PAT nurse will instruct them to NOT bring anyone with them to their appointment since they may have these symptoms or traveled as well.   Please remind your patients and families that hospital visitation restrictions are in effect and the importance of the restrictions.   Patient denies shortness of breath, fever, cough and chest pain at PAT appointment  Patient verbalized understanding of instructions that were given to them at the PAT appointment. Patient was also instructed that they will need to review over the PAT instructions again at home before surgery.

## 2018-08-03 NOTE — Anesthesia Preprocedure Evaluation (Addendum)
Anesthesia Evaluation  Patient identified by MRN, date of birth, ID band Patient awake    Reviewed: Allergy & Precautions, NPO status , Patient's Chart, lab work & pertinent test results  Airway Mallampati: II  TM Distance: >3 FB Neck ROM: Full    Dental  (+) Dental Advisory Given   Pulmonary Current Smoker,    breath sounds clear to auscultation       Cardiovascular + Peripheral Vascular Disease   Rhythm:Regular Rate:Normal     Neuro/Psych negative neurological ROS     GI/Hepatic Neg liver ROS, GERD  ,  Endo/Other  negative endocrine ROS  Renal/GU Renal InsufficiencyRenal disease     Musculoskeletal  (+) Arthritis ,   Abdominal   Peds  Hematology negative hematology ROS (+)   Anesthesia Other Findings   Reproductive/Obstetrics                           Lab Results  Component Value Date   WBC 7.2 08/02/2018   HGB 15.6 08/02/2018   HCT 47.6 08/02/2018   MCV 98.3 08/02/2018   PLT 216 08/02/2018   Lab Results  Component Value Date   CREATININE 1.39 (H) 08/02/2018   BUN 9 08/02/2018   NA 142 08/02/2018   K 4.2 08/02/2018   CL 109 08/02/2018   CO2 25 08/02/2018    Anesthesia Physical Anesthesia Plan  ASA: III  Anesthesia Plan: General   Post-op Pain Management:    Induction: Intravenous  PONV Risk Score and Plan: 1 and Dexamethasone, Ondansetron and Treatment may vary due to age or medical condition  Airway Management Planned: Oral ETT  Additional Equipment:   Intra-op Plan:   Post-operative Plan: Extubation in OR  Informed Consent: I have reviewed the patients History and Physical, chart, labs and discussed the procedure including the risks, benefits and alternatives for the proposed anesthesia with the patient or authorized representative who has indicated his/her understanding and acceptance.     Dental advisory given  Plan Discussed with: CRNA  Anesthesia  Plan Comments: (Nuclear stress 07/18/2018:  Nuclear stress EF: 43%. Visually, the EF appears to be normal . Suggest echo to further assess the LV function .  There was no ST segment deviation noted during stress.  This is a low risk There is no evidence of ischemia or infarction .  The study is normal.  TTE 07/18/2018:  1. The left ventricle has normal systolic function, with an ejection fraction of 55-60%. The cavity size was normal. Left ventricular diastolic parameters were normal. No evidence of left ventricular regional wall motion abnormalities.  2. The right ventricle has normal systolic function. The cavity was normal. There is no increase in right ventricular wall thickness. Right ventricular systolic pressure could not be assessed. )      Anesthesia Quick Evaluation

## 2018-08-06 ENCOUNTER — Inpatient Hospital Stay (HOSPITAL_COMMUNITY)
Admission: RE | Admit: 2018-08-06 | Discharge: 2018-08-12 | DRG: 253 | Disposition: A | Discharge status: Home Health | Payer: Medicaid Other | Attending: Vascular Surgery | Admitting: Vascular Surgery

## 2018-08-06 ENCOUNTER — Inpatient Hospital Stay (HOSPITAL_COMMUNITY): Payer: Medicaid Other | Admitting: Anesthesiology

## 2018-08-06 ENCOUNTER — Other Ambulatory Visit: Payer: Self-pay

## 2018-08-06 ENCOUNTER — Encounter (HOSPITAL_COMMUNITY)
Admission: RE | Disposition: A | Discharge status: Home Health | Payer: Self-pay | Source: Home / Self Care | Attending: Vascular Surgery

## 2018-08-06 ENCOUNTER — Inpatient Hospital Stay (HOSPITAL_COMMUNITY): Payer: Medicaid Other | Admitting: Physician Assistant

## 2018-08-06 ENCOUNTER — Encounter (HOSPITAL_COMMUNITY): Payer: Self-pay | Admitting: *Deleted

## 2018-08-06 DIAGNOSIS — F319 Bipolar disorder, unspecified: Secondary | ICD-10-CM | POA: Diagnosis present

## 2018-08-06 DIAGNOSIS — E860 Dehydration: Secondary | ICD-10-CM | POA: Diagnosis not present

## 2018-08-06 DIAGNOSIS — I70212 Atherosclerosis of native arteries of extremities with intermittent claudication, left leg: Secondary | ICD-10-CM

## 2018-08-06 DIAGNOSIS — Z79891 Long term (current) use of opiate analgesic: Secondary | ICD-10-CM | POA: Diagnosis not present

## 2018-08-06 DIAGNOSIS — Z96641 Presence of right artificial hip joint: Secondary | ICD-10-CM | POA: Diagnosis not present

## 2018-08-06 DIAGNOSIS — R531 Weakness: Secondary | ICD-10-CM | POA: Diagnosis present

## 2018-08-06 DIAGNOSIS — I739 Peripheral vascular disease, unspecified: Secondary | ICD-10-CM | POA: Diagnosis present

## 2018-08-06 DIAGNOSIS — Z79899 Other long term (current) drug therapy: Secondary | ICD-10-CM

## 2018-08-06 DIAGNOSIS — G8918 Other acute postprocedural pain: Secondary | ICD-10-CM | POA: Diagnosis not present

## 2018-08-06 DIAGNOSIS — I2510 Atherosclerotic heart disease of native coronary artery without angina pectoris: Secondary | ICD-10-CM | POA: Diagnosis not present

## 2018-08-06 DIAGNOSIS — G8929 Other chronic pain: Secondary | ICD-10-CM | POA: Diagnosis present

## 2018-08-06 DIAGNOSIS — F209 Schizophrenia, unspecified: Secondary | ICD-10-CM | POA: Diagnosis present

## 2018-08-06 DIAGNOSIS — G43909 Migraine, unspecified, not intractable, without status migrainosus: Secondary | ICD-10-CM | POA: Diagnosis present

## 2018-08-06 DIAGNOSIS — M549 Dorsalgia, unspecified: Secondary | ICD-10-CM | POA: Diagnosis present

## 2018-08-06 DIAGNOSIS — T827XXA Infection and inflammatory reaction due to other cardiac and vascular devices, implants and grafts, initial encounter: Secondary | ICD-10-CM | POA: Diagnosis present

## 2018-08-06 DIAGNOSIS — R197 Diarrhea, unspecified: Secondary | ICD-10-CM | POA: Diagnosis present

## 2018-08-06 DIAGNOSIS — K219 Gastro-esophageal reflux disease without esophagitis: Secondary | ICD-10-CM | POA: Diagnosis present

## 2018-08-06 DIAGNOSIS — Z7982 Long term (current) use of aspirin: Secondary | ICD-10-CM | POA: Diagnosis not present

## 2018-08-06 DIAGNOSIS — Z9889 Other specified postprocedural states: Secondary | ICD-10-CM | POA: Diagnosis not present

## 2018-08-06 DIAGNOSIS — G8314 Monoplegia of lower limb affecting left nondominant side: Secondary | ICD-10-CM | POA: Diagnosis present

## 2018-08-06 DIAGNOSIS — F17210 Nicotine dependence, cigarettes, uncomplicated: Secondary | ICD-10-CM | POA: Diagnosis present

## 2018-08-06 DIAGNOSIS — Z96643 Presence of artificial hip joint, bilateral: Secondary | ICD-10-CM | POA: Diagnosis present

## 2018-08-06 DIAGNOSIS — I743 Embolism and thrombosis of arteries of the lower extremities: Secondary | ICD-10-CM | POA: Diagnosis present

## 2018-08-06 DIAGNOSIS — I70213 Atherosclerosis of native arteries of extremities with intermittent claudication, bilateral legs: Secondary | ICD-10-CM | POA: Diagnosis not present

## 2018-08-06 DIAGNOSIS — R55 Syncope and collapse: Secondary | ICD-10-CM | POA: Diagnosis present

## 2018-08-06 HISTORY — PX: FEMORAL-FEMORAL BYPASS GRAFT: SHX936

## 2018-08-06 HISTORY — PX: PATCH ANGIOPLASTY: SHX6230

## 2018-08-06 HISTORY — PX: ENDARTERECTOMY FEMORAL: SHX5804

## 2018-08-06 LAB — CREATININE, SERUM
Creatinine, Ser: 1.34 mg/dL — ABNORMAL HIGH (ref 0.61–1.24)
GFR calc Af Amer: >60 mL/min (ref >60)
GFR calc non Af Amer: 58 mL/min — ABNORMAL LOW (ref >60)

## 2018-08-06 LAB — CBC
HCT: 45.6 % (ref 39.0–52.0)
Hemoglobin: 15.1 g/dL (ref 13.0–17.0)
MCH: 32.3 pg (ref 26.0–34.0)
MCHC: 33.1 g/dL (ref 30.0–36.0)
MCV: 97.6 fL (ref 80.0–100.0)
Platelets: 201 10*3/uL (ref 150–400)
RBC: 4.67 MIL/uL (ref 4.22–5.81)
RDW: 14.6 % (ref 11.5–15.5)
WBC: 12.2 10*3/uL — ABNORMAL HIGH (ref 4.0–10.5)
nRBC: 0 % (ref 0.0–0.2)

## 2018-08-06 SURGERY — CREATION, BYPASS, ARTERIAL, FEMORAL TO FEMORAL, USING GRAFT
Anesthesia: General | Site: Groin | Laterality: Left

## 2018-08-06 MED ORDER — DEXAMETHASONE SODIUM PHOSPHATE 10 MG/ML IJ SOLN
INTRAMUSCULAR | Status: DC | PRN
  Administered 2018-08-06: 8 mg via INTRAVENOUS

## 2018-08-06 MED ORDER — ACETAMINOPHEN 325 MG PO TABS
325.0000 mg | ORAL_TABLET | ORAL | Status: DC | PRN
  Administered 2018-08-08 (×3): 650 mg via ORAL
  Filled 2018-08-06 (×4): qty 2

## 2018-08-06 MED ORDER — FENTANYL CITRATE (PF) 250 MCG/5ML IJ SOLN
INTRAMUSCULAR | Status: DC | PRN
  Administered 2018-08-06 (×4): 50 ug via INTRAVENOUS
  Administered 2018-08-06: 100 ug via INTRAVENOUS
  Administered 2018-08-06: 50 ug via INTRAVENOUS
  Administered 2018-08-06: 100 ug via INTRAVENOUS
  Administered 2018-08-06: 50 ug via INTRAVENOUS

## 2018-08-06 MED ORDER — MIDAZOLAM HCL 2 MG/2ML IJ SOLN
INTRAMUSCULAR | Status: AC
  Filled 2018-08-06: qty 2

## 2018-08-06 MED ORDER — HYDRALAZINE HCL 20 MG/ML IJ SOLN: 5.0000 mg | INTRAMUSCULAR | Status: DC | PRN

## 2018-08-06 MED ORDER — METOPROLOL TARTRATE 5 MG/5ML IV SOLN: 2.0000 mg | INTRAVENOUS | Status: DC | PRN

## 2018-08-06 MED ORDER — CEFAZOLIN SODIUM-DEXTROSE 2-4 GM/100ML-% IV SOLN
2.0000 g | INTRAVENOUS | Status: AC
  Administered 2018-08-06: 2 g via INTRAVENOUS
  Filled 2018-08-06: qty 100

## 2018-08-06 MED ORDER — HYDROMORPHONE HCL 1 MG/ML IJ SOLN
INTRAMUSCULAR | Status: AC
  Filled 2018-08-06: qty 0.5

## 2018-08-06 MED ORDER — PHENYLEPHRINE 40 MCG/ML (10ML) SYRINGE FOR IV PUSH (FOR BLOOD PRESSURE SUPPORT)
PREFILLED_SYRINGE | INTRAVENOUS | Status: AC
  Filled 2018-08-06: qty 10

## 2018-08-06 MED ORDER — HEPARIN SODIUM (PORCINE) 1000 UNIT/ML IJ SOLN
INTRAMUSCULAR | Status: AC
  Filled 2018-08-06: qty 4

## 2018-08-06 MED ORDER — OXYCODONE HCL 5 MG PO TABS
5.0000 mg | ORAL_TABLET | ORAL | Status: DC | PRN
  Administered 2018-08-06: 10 mg via ORAL
  Administered 2018-08-06: 5 mg via ORAL
  Administered 2018-08-06 – 2018-08-08 (×9): 10 mg via ORAL
  Filled 2018-08-06 (×11): qty 2

## 2018-08-06 MED ORDER — GUAIFENESIN-DM 100-10 MG/5ML PO SYRP: 15.0000 mL | ORAL_SOLUTION | ORAL | Status: DC | PRN

## 2018-08-06 MED ORDER — CHLORHEXIDINE GLUCONATE 4 % EX LIQD: 60.0000 mL | Freq: Once | CUTANEOUS | Status: DC

## 2018-08-06 MED ORDER — POTASSIUM CHLORIDE CRYS ER 20 MEQ PO TBCR: 20.0000 meq | EXTENDED_RELEASE_TABLET | Freq: Every day | ORAL | Status: DC | PRN

## 2018-08-06 MED ORDER — HEPARIN SODIUM (PORCINE) 1000 UNIT/ML IJ SOLN
INTRAMUSCULAR | Status: DC | PRN
  Administered 2018-08-06: 7000 [IU] via INTRAVENOUS

## 2018-08-06 MED ORDER — ONDANSETRON HCL 4 MG/2ML IJ SOLN
INTRAMUSCULAR | Status: AC
  Filled 2018-08-06: qty 2

## 2018-08-06 MED ORDER — PROMETHAZINE HCL 25 MG/ML IJ SOLN: 6.2500 mg | INTRAMUSCULAR | Status: DC | PRN

## 2018-08-06 MED ORDER — ACETAMINOPHEN 325 MG RE SUPP
325.0000 mg | RECTAL | Status: DC | PRN
  Filled 2018-08-06: qty 2

## 2018-08-06 MED ORDER — FENTANYL CITRATE (PF) 250 MCG/5ML IJ SOLN
INTRAMUSCULAR | Status: AC
  Filled 2018-08-06: qty 5

## 2018-08-06 MED ORDER — MIDAZOLAM HCL 5 MG/5ML IJ SOLN
INTRAMUSCULAR | Status: DC | PRN
  Administered 2018-08-06: 2 mg via INTRAVENOUS

## 2018-08-06 MED ORDER — LABETALOL HCL 5 MG/ML IV SOLN
INTRAVENOUS | Status: AC
  Filled 2018-08-06: qty 4

## 2018-08-06 MED ORDER — PROTAMINE SULFATE 10 MG/ML IV SOLN
INTRAVENOUS | Status: AC
  Filled 2018-08-06: qty 25

## 2018-08-06 MED ORDER — SODIUM CHLORIDE 0.9 % IV SOLN: INTRAVENOUS | Status: DC | PRN

## 2018-08-06 MED ORDER — 0.9 % SODIUM CHLORIDE (POUR BTL) OPTIME
TOPICAL | Status: DC | PRN
  Administered 2018-08-06: 1000 mL

## 2018-08-06 MED ORDER — VANCOMYCIN HCL 10 G IV SOLR
1250.0000 mg | INTRAVENOUS | Status: DC
  Administered 2018-08-06 – 2018-08-11 (×6): 1250 mg via INTRAVENOUS
  Filled 2018-08-06 (×6): qty 1250

## 2018-08-06 MED ORDER — PROPOFOL 10 MG/ML IV BOLUS
INTRAVENOUS | Status: AC
  Filled 2018-08-06: qty 20

## 2018-08-06 MED ORDER — FENTANYL CITRATE (PF) 100 MCG/2ML IJ SOLN
INTRAMUSCULAR | Status: AC
  Filled 2018-08-06: qty 2

## 2018-08-06 MED ORDER — ENOXAPARIN SODIUM 40 MG/0.4ML ~~LOC~~ SOLN
40.0000 mg | SUBCUTANEOUS | Status: DC
  Administered 2018-08-07 – 2018-08-12 (×5): 40 mg via SUBCUTANEOUS
  Filled 2018-08-06 (×5): qty 0.4

## 2018-08-06 MED ORDER — SODIUM CHLORIDE 0.9 % IV SOLN
INTRAVENOUS | Status: AC
  Filled 2018-08-06: qty 1.2

## 2018-08-06 MED ORDER — HYDROMORPHONE HCL 1 MG/ML IJ SOLN
INTRAMUSCULAR | Status: DC | PRN
  Administered 2018-08-06: 0.5 mg via INTRAVENOUS

## 2018-08-06 MED ORDER — DOCUSATE SODIUM 100 MG PO CAPS
100.0000 mg | ORAL_CAPSULE | Freq: Every day | ORAL | Status: DC
  Administered 2018-08-07 – 2018-08-12 (×5): 100 mg via ORAL
  Filled 2018-08-06 (×5): qty 1

## 2018-08-06 MED ORDER — MAGNESIUM SULFATE 2 GM/50ML IV SOLN: 2.0000 g | Freq: Every day | INTRAVENOUS | Status: DC | PRN

## 2018-08-06 MED ORDER — LABETALOL HCL 5 MG/ML IV SOLN
INTRAVENOUS | Status: DC | PRN
  Administered 2018-08-06 (×4): 5 mg via INTRAVENOUS

## 2018-08-06 MED ORDER — ONDANSETRON HCL 4 MG/2ML IJ SOLN
INTRAMUSCULAR | Status: DC | PRN
  Administered 2018-08-06: 4 mg via INTRAVENOUS

## 2018-08-06 MED ORDER — PHENYLEPHRINE 40 MCG/ML (10ML) SYRINGE FOR IV PUSH (FOR BLOOD PRESSURE SUPPORT)
PREFILLED_SYRINGE | INTRAVENOUS | Status: AC
  Filled 2018-08-06: qty 20

## 2018-08-06 MED ORDER — ROCURONIUM BROMIDE 10 MG/ML (PF) SYRINGE
PREFILLED_SYRINGE | INTRAVENOUS | Status: DC | PRN
  Administered 2018-08-06: 30 mg via INTRAVENOUS
  Administered 2018-08-06: 40 mg via INTRAVENOUS
  Administered 2018-08-06: 30 mg via INTRAVENOUS

## 2018-08-06 MED ORDER — CEFAZOLIN SODIUM-DEXTROSE 1-4 GM/50ML-% IV SOLN
1.0000 g | Freq: Three times a day (TID) | INTRAVENOUS | Status: DC
  Administered 2018-08-06: 1 g via INTRAVENOUS
  Filled 2018-08-06: qty 50

## 2018-08-06 MED ORDER — LIDOCAINE 2% (20 MG/ML) 5 ML SYRINGE
INTRAMUSCULAR | Status: AC
  Filled 2018-08-06: qty 15

## 2018-08-06 MED ORDER — DEXTROSE-NACL 5-0.45 % IV SOLN
INTRAVENOUS | Status: DC
  Administered 2018-08-06: 16:00:00 via INTRAVENOUS

## 2018-08-06 MED ORDER — PHENYLEPHRINE 40 MCG/ML (10ML) SYRINGE FOR IV PUSH (FOR BLOOD PRESSURE SUPPORT)
PREFILLED_SYRINGE | INTRAVENOUS | Status: DC | PRN
  Administered 2018-08-06 (×5): 80 ug via INTRAVENOUS

## 2018-08-06 MED ORDER — SODIUM CHLORIDE 0.9 % IV SOLN
INTRAVENOUS | Status: DC | PRN
  Administered 2018-08-06: 08:00:00 500 mL

## 2018-08-06 MED ORDER — MORPHINE SULFATE (PF) 2 MG/ML IV SOLN
2.0000 mg | INTRAVENOUS | Status: DC | PRN
  Administered 2018-08-10 – 2018-08-11 (×5): 2 mg via INTRAVENOUS
  Filled 2018-08-06 (×5): qty 1

## 2018-08-06 MED ORDER — ROCURONIUM BROMIDE 10 MG/ML (PF) SYRINGE
PREFILLED_SYRINGE | INTRAVENOUS | Status: AC
  Filled 2018-08-06: qty 20

## 2018-08-06 MED ORDER — SUGAMMADEX SODIUM 200 MG/2ML IV SOLN
INTRAVENOUS | Status: DC | PRN
  Administered 2018-08-06: 200 mg via INTRAVENOUS

## 2018-08-06 MED ORDER — PANTOPRAZOLE SODIUM 40 MG PO TBEC
40.0000 mg | DELAYED_RELEASE_TABLET | Freq: Every day | ORAL | Status: DC
  Administered 2018-08-07 – 2018-08-12 (×5): 40 mg via ORAL
  Filled 2018-08-06 (×5): qty 1

## 2018-08-06 MED ORDER — ROSUVASTATIN CALCIUM 20 MG PO TABS
40.0000 mg | ORAL_TABLET | Freq: Every day | ORAL | Status: DC
  Administered 2018-08-06 – 2018-08-12 (×6): 40 mg via ORAL
  Filled 2018-08-06 (×6): qty 2

## 2018-08-06 MED ORDER — FENTANYL CITRATE (PF) 100 MCG/2ML IJ SOLN
25.0000 ug | INTRAMUSCULAR | Status: DC | PRN
  Administered 2018-08-06 (×2): 50 ug via INTRAVENOUS

## 2018-08-06 MED ORDER — LIDOCAINE 2% (20 MG/ML) 5 ML SYRINGE
INTRAMUSCULAR | Status: DC | PRN
  Administered 2018-08-06 (×2): 50 mg via INTRAVENOUS

## 2018-08-06 MED ORDER — SODIUM CHLORIDE 0.9 % IV SOLN: 500.0000 mL | Freq: Once | INTRAVENOUS | Status: DC | PRN

## 2018-08-06 MED ORDER — LABETALOL HCL 5 MG/ML IV SOLN: 10.0000 mg | INTRAVENOUS | Status: DC | PRN

## 2018-08-06 MED ORDER — ONDANSETRON HCL 4 MG/2ML IJ SOLN: 4.0000 mg | Freq: Four times a day (QID) | INTRAMUSCULAR | Status: DC | PRN

## 2018-08-06 MED ORDER — PROTAMINE SULFATE 10 MG/ML IV SOLN
INTRAVENOUS | Status: DC | PRN
  Administered 2018-08-06: 70 mg via INTRAVENOUS

## 2018-08-06 MED ORDER — LACTATED RINGERS IV SOLN
INTRAVENOUS | Status: DC | PRN
  Administered 2018-08-06 (×3): via INTRAVENOUS

## 2018-08-06 MED ORDER — PROPOFOL 10 MG/ML IV BOLUS
INTRAVENOUS | Status: DC | PRN
  Administered 2018-08-06: 120 mg via INTRAVENOUS

## 2018-08-06 MED ORDER — ALUM & MAG HYDROXIDE-SIMETH 200-200-20 MG/5ML PO SUSP: 15.0000 mL | ORAL | Status: DC | PRN

## 2018-08-06 MED ORDER — SODIUM CHLORIDE 0.9 % IV SOLN: INTRAVENOUS | Status: DC

## 2018-08-06 MED ORDER — PHENOL 1.4 % MT LIQD: 1.0000 | OROMUCOSAL | Status: DC | PRN

## 2018-08-06 SURGICAL SUPPLY — 48 items
ADH SKN CLS APL DERMABOND .7 (GAUZE/BANDAGES/DRESSINGS)
AGENT HMST SPONGE THK3/8 (HEMOSTASIS)
BAG ISL DRAPE 18X18 STRL (DRAPES)
BAG ISOLATION DRAPE 18X18 (DRAPES) ×1 IMPLANT
CANISTER SUCT 3000ML PPV (MISCELLANEOUS) ×2 IMPLANT
CANNULA VESSEL 3MM 2 BLNT TIP (CANNULA) ×4 IMPLANT
CLIP VESOCCLUDE MED 24/CT (CLIP) ×2 IMPLANT
CLIP VESOCCLUDE SM WIDE 24/CT (CLIP) ×2 IMPLANT
CONT SPEC 4OZ CLIKSEAL STRL BL (MISCELLANEOUS) ×1 IMPLANT
COVER WAND RF STERILE (DRAPES) ×1 IMPLANT
DERMABOND ADVANCED (GAUZE/BANDAGES/DRESSINGS)
DERMABOND ADVANCED .7 DNX12 (GAUZE/BANDAGES/DRESSINGS) IMPLANT
DRAIN HEMOVAC 1/8 X 5 (WOUND CARE) IMPLANT
DRAPE ISOLATION BAG 18X18 (DRAPES)
ELECT REM PT RETURN 9FT ADLT (ELECTROSURGICAL) ×2
ELECTRODE REM PT RTRN 9FT ADLT (ELECTROSURGICAL) ×1 IMPLANT
EVACUATOR SILICONE 100CC (DRAIN) IMPLANT
GAUZE 4X4 16PLY RFD (DISPOSABLE) ×1 IMPLANT
GLOVE BIO SURGEON STRL SZ 6 (GLOVE) ×2 IMPLANT
GLOVE BIO SURGEON STRL SZ 6.5 (GLOVE) ×2 IMPLANT
GLOVE BIO SURGEON STRL SZ7.5 (GLOVE) ×2 IMPLANT
GLOVE BIOGEL PI IND STRL 6 (GLOVE) IMPLANT
GLOVE BIOGEL PI IND STRL 6.5 (GLOVE) IMPLANT
GLOVE BIOGEL PI INDICATOR 6 (GLOVE) ×1
GLOVE BIOGEL PI INDICATOR 6.5 (GLOVE) ×2
GOWN STRL REUS W/ TWL LRG LVL3 (GOWN DISPOSABLE) ×3 IMPLANT
GOWN STRL REUS W/TWL LRG LVL3 (GOWN DISPOSABLE) ×6
HEMOSTAT SPONGE AVITENE ULTRA (HEMOSTASIS) IMPLANT
KIT BASIN OR (CUSTOM PROCEDURE TRAY) ×2 IMPLANT
KIT TURNOVER KIT B (KITS) ×2 IMPLANT
NS IRRIG 1000ML POUR BTL (IV SOLUTION) ×4 IMPLANT
PACK PERIPHERAL VASCULAR (CUSTOM PROCEDURE TRAY) ×2 IMPLANT
PAD ARMBOARD 7.5X6 YLW CONV (MISCELLANEOUS) ×4 IMPLANT
STAPLER VISISTAT 35W (STAPLE) IMPLANT
SUT PROLENE 5 0 C 1 24 (SUTURE) ×4 IMPLANT
SUT PROLENE 6 0 CC (SUTURE) ×4 IMPLANT
SUT SILK 3 0 (SUTURE)
SUT SILK 3-0 18XBRD TIE 12 (SUTURE) IMPLANT
SUT VIC AB 2-0 SH 27 (SUTURE) ×4
SUT VIC AB 2-0 SH 27XBRD (SUTURE) ×2 IMPLANT
SUT VIC AB 3-0 SH 27 (SUTURE) ×8
SUT VIC AB 3-0 SH 27X BRD (SUTURE) ×2 IMPLANT
SUT VICRYL 4-0 PS2 18IN ABS (SUTURE) ×4 IMPLANT
TAPE UMBILICAL COTTON 1/8X30 (MISCELLANEOUS) IMPLANT
TOWEL GREEN STERILE (TOWEL DISPOSABLE) ×2 IMPLANT
TRAY FOLEY MTR SLVR 16FR STAT (SET/KITS/TRAYS/PACK) ×2 IMPLANT
UNDERPAD 30X30 (UNDERPADS AND DIAPERS) ×2 IMPLANT
WATER STERILE IRR 1000ML POUR (IV SOLUTION) ×2 IMPLANT

## 2018-08-06 NOTE — Op Note (Signed)
Procedure: Excision segment left limb aortobifemoral bypass graft, left femoral endarterectomy, vein patch angioplasty left common femoral artery  Preoperative diagnosis: Left leg claudication  Postoperative diagnosis: Same  Anesthesia: General  Assistant: Ellsworth Lennox, RNFA  Operative findings: #1 purulent collection adjacent to the left limb of aortobifemoral bypass graft 2 to 3 cc of yellow mucopurulent material culture showed white blood cells on Gram stain intraoperatively  Operative details: After team informed consent, patient was taken to the operating.  The patient was placed in supine position operating table.  After induction general anesthesia and endotracheal ovation, the patient was prepped and draped in usual sterile fashion from the umbilicus to the knees.  Next a longitudinal incision was made in the left groin carried on the subcutaneous tissues down the level of the left limb of the aortobifemoral bypass graft.  Proceeded to dissect out the common femoral profunda femoris and superficial femoral arteries.  There was moderate scar tissue.  Upon dissecting out the aortobifemoral limb higher up adjacent to the lateral side of the inguinal ligament I encountered a 2 to 3 mm collection of yellow mucopurulent material.  Stat Gram stain was obtained which showed white blood cells.  Culture is currently pending.  Due to the fact that the patient may have graft infection in the left groin as the etiology of his limb occlusion I decided not to proceed with the planned femoral-femoral bypass.  At this point I excised this limb of the graft it was transected and the graft which was incorporated and adherent was oversewn on the proximal segment and then the inguinal ligament closed over this with a running 3-0 Vicryl suture.  I then proceeded to remove the remainder of the graft that was attached to the common femoral artery.  A femoral endarterectomy was performed to clean out thrombus in the common  femoral artery.  There was good backbleeding from the profunda and the superficial femoral artery.  A segment of greater saphenous vein was harvested from the saphenofemoral junction to approximately 5 cm into the thigh with an additional incision in the thigh.  It was ligated distally and proximally with 2-0 silk tie.  The vein was placed in a reverse configuration.  The arteriotomy was extended from the old arteriotomy down approximately 2 cm into the superficial femoral artery to get to a reasonable segment of artery.  Vein patch angioplasty was then performed using a running 6-0 Prolene suture.  The patient had been given 8000 units of heparin prior to proceeding.  At completion the patch over then was for blood backbled and thoroughly flushed reanastomosed was secured clamps released there was no pulsatile flow in the artery but it did backfill and there was Doppler flow in the common femoral profunda femoris and superficial femoral arteries.  Hemostasis was obtained with direct pressure and administration of protamine.  Both incisions were then closed in multiple layers a running 3-0 Vicryl suture and a 4-0 Vicryl subcuticular stitch in the skin.  The patient tolerated procedure well and there were no complications.  The instrument sponge and needle count was correct at the end of the case.  We will follow-up on the intraoperative cultures over the next few days.  Ruta Hinds, MD [facility] [place] Office: [phone] Pager: [phone]

## 2018-08-06 NOTE — Anesthesia Procedure Notes (Signed)
Procedure Name: Intubation Date/Time: 08/06/2018 7:37 AM Performed by: Mariea Clonts, CRNA Pre-anesthesia Checklist: Patient identified, Emergency Drugs available, Suction available and Patient being monitored Patient Re-evaluated:Patient Re-evaluated prior to induction Oxygen Delivery Method: Circle System Utilized Preoxygenation: Pre-oxygenation with 100% oxygen Induction Type: IV induction Ventilation: Mask ventilation without difficulty Laryngoscope Size: Miller and 2 Grade View: Grade I Tube type: Oral Tube size: 7.5 mm Number of attempts: 1 Airway Equipment and Method: Stylet and Oral airway Placement Confirmation: ETT inserted through vocal cords under direct vision,  positive ETCO2 and breath sounds checked- equal and bilateral Tube secured with: Tape Dental Injury: Teeth and Oropharynx as per pre-operative assessment

## 2018-08-06 NOTE — Progress Notes (Signed)
Spoke with r fitzgerald mda re bp's/ no rx unless sbp >180 / dbp >100

## 2018-08-06 NOTE — Interval H&P Note (Signed)
History and Physical Interval Note:  08/06/2018 7:24 AM  James Stevens  has presented today for surgery, with the diagnosis of PERIPHERAL VASCULAR DISEASE WITH CLAUDICATION.  The various methods of treatment have been discussed with the patient and family. After consideration of risks, benefits and other options for treatment, the patient has consented to  Procedure(s): RIGHT TO LEFT FEMORAL TO FEMORAL ARTERY BYPASS GRAFT (Bilateral) as a surgical intervention.  The patient's history has been reviewed, patient examined, no change in status, stable for surgery.  I have reviewed the patient's chart and labs.  Questions were answered to the patient's satisfaction.     Ruta Hinds

## 2018-08-06 NOTE — Progress Notes (Signed)
PT arrived from PACU. CHG bath given, vitals stable. Pt oriented to room. Tele notified, pt hardwired. Call bell within reach. Will continue to monitor. Jerald Kief, RN

## 2018-08-06 NOTE — Transfer of Care (Signed)
Immediate Anesthesia Transfer of Care Note  Patient: James Stevens  Procedure(s) Performed: Ligation and Removal of Left Limb of Aortobifemoral Bypass Graft (Left Groin) Left Common Femoral Endarterectomy (Left Groin) Patch Angioplasty Left Common Femoral Artery with Greater Saphenous Vein (Left Groin)  Patient Location: PACU  Anesthesia Type:General  Level of Consciousness: awake, alert  and oriented  Airway & Oxygen Therapy: Patient Spontanous Breathing and Patient connected to nasal cannula oxygen  Post-op Assessment: Report given to RN, Post -op Vital signs reviewed and stable and Patient moving all extremities X 4  Post vital signs: Reviewed and stable  Last Vitals:  Vitals Value Taken Time  BP 173/98 08/06/18 1209  Temp    Pulse 75 08/06/18 1210  Resp 19 08/06/18 1210  SpO2 96 % 08/06/18 1210  Vitals shown include unvalidated device data.  Last Pain:  Vitals:   08/06/18 0603  PainSc: 7       Patients Stated Pain Goal: 5 (31/51/76 1607)  Complications: No apparent anesthesia complications

## 2018-08-06 NOTE — Progress Notes (Signed)
Pharmacy Antibiotic Note  James Stevens is a 58 y.o. male admitted on 08/06/2018 with wound infection.  Pharmacy has been consulted for vancomycin dosing.  Assessment: WBC: 12.2 Tmax: 97.8 Vancomycin 1250mg  IV every 24 hours. Goal AUC 400-550. Expected AUC: 500 SCr used: 1.34  7/27 per Dr. Oneida Alar "Operative findings: purulent collection adjacent to the left limb of aortobifemoral bypass graft 2 to 3 cc of yellow mucopurulent material culture showed white blood cells on Gram stain intraoperatively"  Plan: Vancomycin 1250mg  IV every 24 hours.  Goal trough 10-15 mcg/mL.  Height: 5\' 9"  (175.3 cm) Weight: 141 lb 1.5 oz (64 kg) IBW/kg (Calculated) : 70.7  Temp (24hrs), Avg:97.6 F (36.4 C), Min:97.3 F (36.3 C), Max:97.8 F (36.6 C)  Recent Labs  Lab 08/02/18 1216 08/06/18 1438  WBC 7.2 12.2*  CREATININE 1.39* 1.34*    Estimated Creatinine Clearance: 54.4 mL/min (A) (by C-G formula based on SCr of 1.34 mg/dL (H)).    Allergies  Allergen Reactions  . Ibuprofen Other (See Comments)    Gastric irritation  . Lyrica [Pregabalin] Anxiety  . Tramadol Itching    Antimicrobials this admission: 7/27 cefazolin >> 7/27 7/27 vancomycin >>   Dose adjustments this admission: N/A  Microbiology results: 7/27 wound culture x2 >> pending   Thank you for allowing pharmacy to be a part of this patient's care.  Kennon Holter, PharmD PGY1 Ambulatory Care Pharmacy Resident [employer] Phone: [phone] 08/06/2018 4:27 PM

## 2018-08-06 NOTE — Anesthesia Postprocedure Evaluation (Signed)
Anesthesia Post Note  Patient: James Stevens  Procedure(s) Performed: Ligation and Removal of Left Limb of Aortobifemoral Bypass Graft (Left Groin) Left Common Femoral Endarterectomy (Left Groin) Patch Angioplasty Left Common Femoral Artery with Greater Saphenous Vein (Left Groin)     Patient location during evaluation: PACU Anesthesia Type: General Level of consciousness: awake and alert Pain management: pain level controlled Vital Signs Assessment: post-procedure vital signs reviewed and stable Respiratory status: spontaneous breathing, nonlabored ventilation, respiratory function stable and patient connected to nasal cannula oxygen Cardiovascular status: blood pressure returned to baseline and stable Postop Assessment: no apparent nausea or vomiting Anesthetic complications: no    Last Vitals:  Vitals:   08/06/18 1310 08/06/18 1322  BP: (!) 159/94   Pulse: 77 66  Resp: 17 12  Temp:    SpO2: 98% 99%    Last Pain:  Vitals:   08/06/18 1230  PainSc: 0-No pain                 Tiajuana Amass

## 2018-08-07 ENCOUNTER — Inpatient Hospital Stay (HOSPITAL_COMMUNITY): Payer: Medicaid Other

## 2018-08-07 ENCOUNTER — Encounter (HOSPITAL_COMMUNITY): Payer: Self-pay | Admitting: Vascular Surgery

## 2018-08-07 DIAGNOSIS — Z9889 Other specified postprocedural states: Secondary | ICD-10-CM

## 2018-08-07 LAB — BASIC METABOLIC PANEL
Anion gap: 8 (ref 5–15)
BUN: 9 mg/dL (ref 6–20)
CO2: 24 mmol/L (ref 22–32)
Calcium: 9.1 mg/dL (ref 8.9–10.3)
Chloride: 105 mmol/L (ref 98–111)
Creatinine, Ser: 1.27 mg/dL — ABNORMAL HIGH (ref 0.61–1.24)
GFR calc Af Amer: >60 mL/min (ref >60)
GFR calc non Af Amer: >60 mL/min (ref >60)
Glucose, Bld: 130 mg/dL — ABNORMAL HIGH (ref 70–99)
Potassium: 4.2 mmol/L (ref 3.5–5.1)
Sodium: 137 mmol/L (ref 135–145)

## 2018-08-07 LAB — CBC
HCT: 40.7 % (ref 39.0–52.0)
Hemoglobin: 13.9 g/dL (ref 13.0–17.0)
MCH: 32.8 pg (ref 26.0–34.0)
MCHC: 34.2 g/dL (ref 30.0–36.0)
MCV: 96 fL (ref 80.0–100.0)
Platelets: 207 10*3/uL (ref 150–400)
RBC: 4.24 MIL/uL (ref 4.22–5.81)
RDW: 14.3 % (ref 11.5–15.5)
WBC: 11.6 10*3/uL — ABNORMAL HIGH (ref 4.0–10.5)
nRBC: 0 % (ref 0.0–0.2)

## 2018-08-07 MED ORDER — IOHEXOL 350 MG/ML SOLN
100.0000 mL | Freq: Once | INTRAVENOUS | Status: AC | PRN
  Administered 2018-08-07: 100 mL via INTRAVENOUS

## 2018-08-07 NOTE — Progress Notes (Signed)
ABI's have been completed. Preliminary results can be found in CV Proc through chart review.   08/07/18 12:02 PM James Stevens RVT

## 2018-08-07 NOTE — Progress Notes (Signed)
OT Cancellation Note  Patient Details Name: James Stevens MRN: [id number] DOB: [birth year]   Cancelled Treatment:    Reason Eval/Treat Not Completed: Patient at procedure or test/ unavailable, RN reports transport taking pt to CT.  Will follow and see as able.   Delight Stare, OT Acute Rehabilitation Services Pager [phone] Office [phone]    Delight Stare 08/07/2018, 9:25 AM

## 2018-08-07 NOTE — Progress Notes (Addendum)
  Progress Note    08/07/2018 8:14 AM 1 Day Post-Op  Subjective:  No complaints; denies pain in his feet  Afebrile HR 64'P-32'R NSR 518'A systolic 41% RA  Vitals:   08/06/18 2045 08/07/18 0350  BP: 140/84 (!) 142/80  Pulse: 64 69  Resp: 15 14  Temp: 98.1 F (36.7 C) 98.1 F (36.7 C)  SpO2: 96% 97%    Physical Exam: Cardiac:  regular Lungs:  Non labored Incisions:  Left groin incisions are clean and dry without hematoma Extremities:  monophasic DP/PT left foot; brisk doppler signals right DP/PT   CBC    Component Value Date/Time   WBC 11.6 (H) 08/07/2018 0416   RBC 4.24 08/07/2018 0416   HGB 13.9 08/07/2018 0416   HCT 40.7 08/07/2018 0416   PLT 207 08/07/2018 0416   MCV 96.0 08/07/2018 0416   MCH 32.8 08/07/2018 0416   MCHC 34.2 08/07/2018 0416   RDW 14.3 08/07/2018 0416   LYMPHSABS 2.6 04/09/2016 0129   MONOABS 0.8 04/09/2016 0129   EOSABS 0.1 04/09/2016 0129   BASOSABS 0.0 04/09/2016 0129    BMET    Component Value Date/Time   NA 137 08/07/2018 0416   K 4.2 08/07/2018 0416   CL 105 08/07/2018 0416   CO2 24 08/07/2018 0416   GLUCOSE 130 (H) 08/07/2018 0416   BUN 9 08/07/2018 0416   CREATININE 1.27 (H) 08/07/2018 0416   CALCIUM 9.1 08/07/2018 0416   GFRNONAA >60 08/07/2018 0416   GFRAA >60 08/07/2018 0416    INR    Component Value Date/Time   INR 1.0 08/02/2018 1216     Intake/Output Summary (Last 24 hours) at 08/07/2018 0814 Last data filed at 08/07/2018 0353 Gross per 24 hour  Intake 2581.92 ml  Output 2375 ml  Net 206.92 ml   Intraoperative groin cx 08/06/2018: Component 1d ago  Specimen Description GROIN   Special Requests GRAFT MATERIAL FROM LEFT GROIN   Gram Stain NO WBC SEEN  NO ORGANISMS SEEN  Performed at [facility], [address] [place], [place] [address]   Culture PENDING   Report Status PENDING       Assessment:  58 y.o. male is s/p:  Excision segment left limb aortobifemoral bypass graft, left femoral  endarterectomy, vein patch angioplasty left common femoral artery  1 Day Post-Op  Plan: -monophasic DP/PT left foot; brisk doppler signals right DP/PT -incisions look good -await final cx-no organisms or WBC seen on gram stain.  CTA ordered to evaluate for infection of graft.  Continue vancomycin per pharmacy -DVT prophylaxis:  Lovenox -pt needs to mobilize out of bed to chair and walk.   Leontine Locket, PA-C [facility] [phone] 08/07/2018 8:14 AM  CTA reviewed no other evidence of graft infection   Continue antibiotics Fem fem Friday if remains culture negative  Ruta Hinds, MD [facility] [place] Office: [phone] Pager: [phone]

## 2018-08-07 NOTE — Evaluation (Signed)
Physical Therapy Evaluation Patient Details Name: James AnchorsDaniel J Butt MRN: [id number] DOB: 04/03/1960 Today's Date: 08/07/2018   History of Present Illness  Pt is a 58 y/o male s/p Excision segment left limb aortobifemoral bypass graft, left femoral endarterectomy, vein patch angioplasty left common femoral artery. PMH: arthritis, bipolar affective, depression, schizophrenia, lumbar sx, L total hip.   Clinical Impression  Pt admitted with/for L limb claudication, s/p aorobifemoral BPG with femoral endarterectomy.  Pt needing min guard to min assist for mobility presently due to pain, but should progress rapidly as pain decreases.  Pt currently limited functionally due to the problems listed below.  (see problems list.)  Pt will benefit from PT to maximize function and safety to be able to get home safely with available assist.     Follow Up Recommendations No PT follow up    Equipment Recommendations  Other (comment)(maybe a RW)    Recommendations for Other Services       Precautions / Restrictions Precautions Precautions: Fall Restrictions Weight Bearing Restrictions: No      Mobility  Bed Mobility Overal bed mobility: Needs Assistance Bed Mobility: Sidelying to Sit;Rolling Rolling: Supervision Sidelying to sit: Min guard;HOB elevated       General bed mobility comments: min guard for safety, mgmt to L E; cueing to roll for pain mgmt  Transfers Overall transfer level: Needs assistance Equipment used: Rolling walker (2 wheeled) Transfers: Sit to/from Stand Sit to Stand: Min guard;Min assist         General transfer comment: min guard initally for safety, fading to min assist with fatigue/pain  Ambulation/Gait Ambulation/Gait assistance: Min assist;Min guard Gait Distance (Feet): 150 Feet Assistive device: Rolling walker (2 wheeled) Gait Pattern/deviations: Step-to pattern;Decreased step length - left;Decreased step length - right;Decreased stance time -  left;Decreased stride length;Antalgic Gait velocity: slow Gait velocity interpretation: <1.8 ft/sec, indicate of risk for recurrent falls General Gait Details: generally antalgic with extra use of the RW, but pt notably putting less weight through L LE as pain increasing.  Stairs            Wheelchair Mobility    Modified Rankin (Stroke Patients Only)       Balance Overall balance assessment: Needs assistance Sitting-balance support: No upper extremity supported;Feet supported Sitting balance-Leahy Scale: Good     Standing balance support: Bilateral upper extremity supported;During functional activity Standing balance-Leahy Scale: Poor Standing balance comment: relaint on RW                             Pertinent Vitals/Pain Pain Assessment: Faces Faces Pain Scale: Hurts even more(up to 8 with mobility ) Pain Location: L LE  Pain Descriptors / Indicators: Discomfort;Guarding;Grimacing Pain Intervention(s): Monitored during session    Home Living Family/patient expects to be discharged to:: Private residence Living Arrangements: Parent Available Help at Discharge: Family;Available 24 hours/day Type of Home: Apartment Home Access: Stairs to enter Entrance Stairs-Rails: LawyerLeft;Right Entrance Stairs-Number of Steps: 5 Home Layout: One level Home Equipment: Environmental consultantWalker - 4 wheels;Crutches      Prior Function Level of Independence: Needs assistance   Gait / Transfers Assistance Needed: reports using crutches for mobility the last few weeks, prior independnet   ADL's / Homemaking Assistance Needed: has HHA who assist with ADLs/IADLs as needed, requires assist for LB ADls         Hand Dominance   Dominant Hand: Right    Extremity/Trunk Assessment   Upper Extremity Assessment  Upper Extremity Assessment: Overall WFL for tasks assessed    Lower Extremity Assessment Lower Extremity Assessment: RLE deficits/detail;LLE deficits/detail RLE Deficits /  Details: WFL LLE Deficits / Details: painful and weak in proximal musculature, but able to bear weight LLE Coordination: decreased fine motor       Communication   Communication: No difficulties  Cognition Arousal/Alertness: Awake/alert Behavior During Therapy: WFL for tasks assessed/performed Overall Cognitive Status: Within Functional Limits for tasks assessed                                        General Comments      Exercises     Assessment/Plan    PT Assessment Patient needs continued PT services  PT Problem List Decreased strength;Decreased activity tolerance;Decreased mobility;Other (comment)       PT Treatment Interventions DME instruction;Gait training;Stair training;Functional mobility training;Therapeutic activities;Patient/family education    PT Goals (Current goals can be found in the Care Plan section)  Acute Rehab PT Goals Patient Stated Goal: to have less pain PT Goal Formulation: With patient Time For Goal Achievement: 08/14/18 Potential to Achieve Goals: Good    Frequency Min 3X/week   Barriers to discharge        Co-evaluation               AM-PAC PT "6 Clicks" Mobility  Outcome Measure Help needed turning from your back to your side while in a flat bed without using bedrails?: None Help needed moving from lying on your back to sitting on the side of a flat bed without using bedrails?: None Help needed moving to and from a bed to a chair (including a wheelchair)?: A Little Help needed standing up from a chair using your arms (e.g., wheelchair or bedside chair)?: A Little Help needed to walk in hospital room?: A Little Help needed climbing 3-5 steps with a railing? : A Little 6 Click Score: 20    End of Session   Activity Tolerance: Patient tolerated treatment well;Patient limited by pain Patient left: in bed;with call bell/phone within reach Nurse Communication: Mobility status PT Visit Diagnosis: Other  abnormalities of gait and mobility (R26.89);Pain Pain - Right/Left: Left Pain - part of body: (leg and groin)    Time: 2297-9892 PT Time Calculation (min) (ACUTE ONLY): 15 min   Charges:   PT Evaluation $PT Eval Low Complexity: 1 Low          08/07/2018  Donnella Sham, PT Acute Rehabilitation Services [phone]  (pager) [phone]  (office)  Tessie Fass Carliss Porcaro 08/07/2018, 5:43 PM

## 2018-08-07 NOTE — Evaluation (Signed)
Occupational Therapy Evaluation Patient Details Name: James AnchorsDaniel J Stevens MRN: [id number] DOB: 02/05/1960 Today's Date: 08/07/2018    History of Present Illness Pt is a 58 y/o male s/p Excision segment left limb aortobifemoral bypass graft, left femoral endarterectomy, vein patch angioplasty left common femoral artery. PMH: arthritis, bipolar affective, depression, schizophrenia, lumbar sx, L total hip.    Clinical Impression   PTA patient reports independent with mobility using crutches, assist with LB ADLs and iADLs from aide (3hrs/day).  Admitted for above and limited by problem list below, including impaired balance and pain in L LE.  Patient currently requires min guard to min assist for transfers and mobility using RW (increased assist with fatigue and pain increasing), LB ADLs with min-mod assist (but this is baseline level), and toileting seated with supervision.  Patient will benefit from continued OT services while admitted in order to maximize independence and safety with ADLs/mobility, but anticipate he will progress well with no further OT needs needed after dc.  Of note, further surgery planned for Friday and will therefore need to re-evaluate post op.     Follow Up Recommendations  No OT follow up;Supervision/Assistance - 24 hour    Equipment Recommendations  3 in 1 bedside commode    Recommendations for Other Services       Precautions / Restrictions Precautions Precautions: Fall Restrictions Weight Bearing Restrictions: No      Mobility Bed Mobility Overal bed mobility: Needs Assistance Bed Mobility: Sidelying to Sit;Rolling Rolling: Supervision Sidelying to sit: Min guard;HOB elevated       General bed mobility comments: min guard for safety, mgmt to L E; cueing to roll for pain mgmt  Transfers Overall transfer level: Needs assistance Equipment used: Rolling walker (2 wheeled) Transfers: Sit to/from Stand Sit to Stand: Min guard;Min assist          General transfer comment: min guard initally for safety, fading to min assist with fatigue/pain    Balance Overall balance assessment: Needs assistance Sitting-balance support: No upper extremity supported;Feet supported Sitting balance-Leahy Scale: Good     Standing balance support: Bilateral upper extremity supported;During functional activity Standing balance-Leahy Scale: Poor Standing balance comment: relaint on RW                           ADL either performed or assessed with clinical judgement   ADL Overall ADL's : Needs assistance/impaired     Grooming: Set up;Sitting   Upper Body Bathing: Set up;Supervision/ safety;Sitting   Lower Body Bathing: Minimal assistance;Sit to/from stand   Upper Body Dressing : Set up;Sitting   Lower Body Dressing: Moderate assistance;Sit to/from stand   Toilet Transfer: Minimal assistance;Ambulation;RW Toilet Transfer Details (indicate cue type and reason): increased assist as pt fatigues  Toileting- Clothing Manipulation and Hygiene: Set up;Supervision/safety;Sit to/from stand Toileting - Clothing Manipulation Details (indicate cue type and reason): using urinal, sitting for safety      Functional mobility during ADLs: Min guard;Minimal assistance;Rolling walker General ADL Comments: pt requires increased A as he fatigues and pain increases, initally able to bear weight through L LE but progressing to increased reliance on B UEs; at baseline needs assist with LB ADLs     Vision         Perception     Praxis      Pertinent Vitals/Pain Pain Assessment: Faces Faces Pain Scale: Hurts even more(up to 8 with mobility ) Pain Location: L LE  Pain Descriptors / Indicators:  Discomfort;Guarding;Grimacing Pain Intervention(s): Monitored during session;Repositioned;Limited activity within patient's tolerance     Hand Dominance Right   Extremity/Trunk Assessment Upper Extremity Assessment Upper Extremity Assessment:  Overall WFL for tasks assessed   Lower Extremity Assessment Lower Extremity Assessment: Defer to PT evaluation       Communication Communication Communication: No difficulties   Cognition Arousal/Alertness: Awake/alert Behavior During Therapy: WFL for tasks assessed/performed Overall Cognitive Status: Within Functional Limits for tasks assessed                                     General Comments       Exercises     Shoulder Instructions      Home Living Family/patient expects to be discharged to:: Private residence Living Arrangements: Parent Available Help at Discharge: Family;Available 24 hours/day Type of Home: Apartment Home Access: Stairs to enter [employer] of Steps: 5 Entrance Stairs-Rails: Left;Right Home Layout: One level     Bathroom Shower/Tub: Teacher, early years/pre: Standard     Home Equipment: Environmental consultant - 4 wheels;Crutches          Prior Functioning/Environment Level of Independence: Needs assistance  Gait / Transfers Assistance Needed: reports using crutches for mobility the last few weeks, prior independnet  ADL's / Homemaking Assistance Needed: has HHA who assist with ADLs/IADLs as needed, requires assist for LB ADls             OT Problem List: Decreased strength;Decreased activity tolerance;Impaired balance (sitting and/or standing);Decreased knowledge of use of DME or AE;Decreased knowledge of precautions;Pain      OT Treatment/Interventions: Self-care/ADL training;DME and/or AE instruction;Therapeutic exercise;Therapeutic activities;Patient/family education;Balance training    OT Goals(Current goals can be found in the care plan section) Acute Rehab OT Goals Patient Stated Goal: to have less pain OT Goal Formulation: With patient Time For Goal Achievement: 08/21/18 Potential to Achieve Goals: Good  OT Frequency: Min 2X/week   Barriers to D/C:            Co-evaluation               AM-PAC OT "6 Clicks" Daily Activity     Outcome Measure Help from another person eating meals?: None Help from another person taking care of personal grooming?: A Little Help from another person toileting, which includes using toliet, bedpan, or urinal?: A Little Help from another person bathing (including washing, rinsing, drying)?: A Little Help from another person to put on and taking off regular upper body clothing?: None Help from another person to put on and taking off regular lower body clothing?: A Little 6 Click Score: 20   End of Session Equipment Utilized During Treatment: Rolling walker Nurse Communication: Mobility status  Activity Tolerance: Patient tolerated treatment well Patient left: with call bell/phone within reach;Other (comment)(seated EOB)  OT Visit Diagnosis: Other abnormalities of gait and mobility (R26.89);Pain Pain - Right/Left: Left Pain - part of body: Leg                Time: 1416-1431 OT Time Calculation (min): 15 min Charges:  OT General Charges $OT Visit: 1 Visit OT Evaluation $OT Eval Moderate Complexity: [place], OT Acute Rehabilitation Services Pager [phone] Office [phone]   Delight Stare 08/07/2018, 4:36 PM

## 2018-08-08 LAB — AEROBIC CULTURE W GRAM STAIN (SUPERFICIAL SPECIMEN)
Culture: NO GROWTH
Culture: NO GROWTH

## 2018-08-08 MED ORDER — OXYCODONE-ACETAMINOPHEN 7.5-325 MG PO TABS
1.0000 | ORAL_TABLET | Freq: Four times a day (QID) | ORAL | Status: DC | PRN
  Administered 2018-08-08: 1 via ORAL
  Filled 2018-08-08: qty 1

## 2018-08-08 MED ORDER — OXYCODONE-ACETAMINOPHEN 5-325 MG PO TABS
1.0000 | ORAL_TABLET | ORAL | Status: DC | PRN
  Administered 2018-08-08: 2 via ORAL
  Administered 2018-08-08: 21:00:00 1 via ORAL
  Administered 2018-08-09 – 2018-08-10 (×8): 2 via ORAL
  Administered 2018-08-10: 1 via ORAL
  Administered 2018-08-10 – 2018-08-12 (×9): 2 via ORAL
  Filled 2018-08-08 (×19): qty 2

## 2018-08-08 NOTE — H&P (View-Only) (Signed)
[facility]  Subjective  - feels ok   Objective (!) 151/88 67 97.8 F (36.6 C) (Oral) 11 98%  Intake/Output Summary (Last 24 hours) at 08/08/2018 0947 Last data filed at 08/07/2018 2336 Gross per 24 hour  Intake -  Output 1100 ml  Net -1100 ml   Left groin incision intact   Assessment/Planning: Follow up cultures if remain negative tomorrrow Fem fem Friday Requesting 10/325 percocet  James Stevens 08/08/2018 9:47 AM --  Laboratory Lab Results: Recent Labs    08/06/18 1438 08/07/18 0416  WBC 12.2* 11.6*  HGB 15.1 13.9  HCT 45.6 40.7  PLT 201 207   BMET Recent Labs    08/06/18 1438 08/07/18 0416  NA  --  137  K  --  4.2  CL  --  105  CO2  --  24  GLUCOSE  --  130*  BUN  --  9  CREATININE 1.34* 1.27*  CALCIUM  --  9.1    COAG Lab Results  Component Value Date   INR 1.0 08/02/2018   INR 1.13 10/07/2013   INR 1.00 10/07/2013   No results found for: PTT     

## 2018-08-08 NOTE — Progress Notes (Signed)
[facility]  Subjective  - feels ok   Objective (!) 151/88 67 97.8 F (36.6 C) (Oral) 11 98%  Intake/Output Summary (Last 24 hours) at 08/08/2018 0947 Last data filed at 08/07/2018 2336 Gross per 24 hour  Intake -  Output 1100 ml  Net -1100 ml   Left groin incision intact   Assessment/Planning: Follow up cultures if remain negative tomorrrow Fem fem Friday Requesting 10/325 percocet  James Stevens 08/08/2018 9:47 AM --  Laboratory Lab Results: Recent Labs    08/06/18 1438 08/07/18 0416  WBC 12.2* 11.6*  HGB 15.1 13.9  HCT 45.6 40.7  PLT 201 207   BMET Recent Labs    08/06/18 1438 08/07/18 0416  NA  --  137  K  --  4.2  CL  --  105  CO2  --  24  GLUCOSE  --  130*  BUN  --  9  CREATININE 1.34* 1.27*  CALCIUM  --  9.1    COAG Lab Results  Component Value Date   INR 1.0 08/02/2018   INR 1.13 10/07/2013   INR 1.00 10/07/2013   No results found for: PTT

## 2018-08-09 NOTE — Progress Notes (Signed)
Pharmacy Antibiotic Note  James Stevens is a 58 y.o. 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   Pharmacy has been consulted for vancomycin dosing.  Assessment: WBC trending down 11.6 <<12.2. Scr 1.27 at baseline. Afebrile. CT graft on 7/28 with no evidence of abscess.  Plan: Vancomycin 1250mg  IV every 24 hours.  Goal trough 10-15 mcg/mL.  Fem fem bypass planned 7/31.  F/U post-op antibiotic plans.  Height: 5\' 9"  (175.3 cm) Weight: 141 lb 1.5 oz (64 kg) IBW/kg (Calculated) : 70.7  Temp (24hrs), Avg:98.3 F (36.8 C), Min:97.9 F (36.6 C), Max:98.6 F (37 C)  Recent Labs  Lab 08/02/18 1216 08/06/18 1438 08/07/18 0416  WBC 7.2 12.2* 11.6*  CREATININE 1.39* 1.34* 1.27*    Estimated Creatinine Clearance: 57.4 mL/min (A) (by C-G formula based on SCr of 1.27 mg/dL (H)).    Allergies  Allergen Reactions  . Ibuprofen Other (See Comments)    Gastric irritation  . Lyrica [Pregabalin] Anxiety  . Tramadol Itching    Antimicrobials this admission: 7/27 cefazolin >> 7/27 7/27 vancomycin >>   Dose adjustments this admission: N/A  Microbiology results: 7/27 wound culture x2 >> few WBC>>NGTD  Thank you for allowing pharmacy to be a part of this patient's care.  Berenice Bouton, PharmD PGY1 Pharmacy Resident Office phone: [phone]  08/09/2018 10:53 AM

## 2018-08-09 NOTE — Progress Notes (Signed)
Physical Therapy Treatment Patient Details Name: James Stevens MRN: [id number] DOB: [birth year] Today's Date: 08/09/2018    History of Present Illness Pt is a 58 y/o male s/p Excision segment left limb aortobifemoral bypass graft, left femoral endarterectomy, vein patch angioplasty left common femoral artery. PMH: arthritis, bipolar affective, depression, schizophrenia, lumbar sx, L total hip.     PT Comments    Pt was seen for there exercises as he had just returned from walking and handled well with some minor help to support LLE through the ex's.  Pt is motivated and will continue to see acutely to work on gait and will determine if stairs need to be a part of the plan.     Follow Up Recommendations  No PT follow up     Equipment Recommendations  Other (comment)    Recommendations for Other Services       Precautions / Restrictions Precautions Precautions: Fall Precaution Comments: monitor vitals and pain with gait Required Braces or Orthoses: (telemetry) Restrictions Weight Bearing Restrictions: No    Mobility  Bed Mobility Overal bed mobility: Needs Assistance             General bed mobility comments: scooted up in bed with supervision  Transfers                 General transfer comment: deferred due to fatigue  Ambulation/Gait                 Stairs             Wheelchair Mobility    Modified Rankin (Stroke Patients Only)       Balance                                            Cognition Arousal/Alertness: Awake/alert Behavior During Therapy: WFL for tasks assessed/performed Overall Cognitive Status: Within Functional Limits for tasks assessed                                        Exercises General Exercises - Lower Extremity Ankle Circles/Pumps: AROM;Both;5 reps Quad Sets: AROM;Both;10 reps Gluteal Sets: AROM;Both;10 reps Heel Slides: AAROM;AROM;Both;10 reps Hip  ABduction/ADduction: AROM;AAROM;10 reps;Both Straight Leg Raises: AROM;AAROM;Both;10 reps    General Comments        Pertinent Vitals/Pain Pain Assessment: 0-10 Pain Score: 7  Pain Location: L LE  Pain Descriptors / Indicators: Operative site guarding Pain Intervention(s): Limited activity within patient's tolerance;Repositioned    Home Living                      Prior Function            PT Goals (current goals can now be found in the care plan section) Acute Rehab PT Goals Patient Stated Goal: to have less pain Progress towards PT goals: Progressing toward goals    Frequency    Min 3X/week      PT Plan Current plan remains appropriate    Co-evaluation              AM-PAC PT "6 Clicks" Mobility   Outcome Measure  Help needed turning from your back to your side while in a flat bed without using bedrails?: None Help needed moving from lying on your  back to sitting on the side of a flat bed without using bedrails?: None Help needed moving to and from a bed to a chair (including a wheelchair)?: A Little Help needed standing up from a chair using your arms (e.g., wheelchair or bedside chair)?: A Little Help needed to walk in hospital room?: A Little Help needed climbing 3-5 steps with a railing? : A Little 6 Click Score: 20    End of Session   Activity Tolerance: Patient tolerated treatment well;Patient limited by pain Patient left: in bed;with call bell/phone within reach Nurse Communication: Mobility status PT Visit Diagnosis: Other abnormalities of gait and mobility (R26.89);Pain Pain - Right/Left: Left Pain - part of body: Hip     Time: [id number] PT Time Calculation (min) (ACUTE ONLY): 11 min  Charges:  $Therapeutic Exercise: 8-22 mins                    Ivar DrapeRuth E Euriah Matlack 08/09/2018, 1:56 PM   Samul Dadauth Joeleen Wortley, PT MS Acute Rehab Dept. Number: [facility] [phone] and [facility] [phone]

## 2018-08-09 NOTE — Progress Notes (Signed)
[facility]  Subjective  - feels ok walked some   Objective (!) 158/86 80 98.6 F (37 C) (Oral) 17 94% No intake or output data in the 24 hours ending 08/09/18 0759  Left groin incision clean  Assessment/Planning: Recheck cultures today Fem fem tomorrow Will need 4-6 weeks antibiotics post op  Ruta Hinds 08/09/2018 7:59 AM --  Laboratory Lab Results: Recent Labs    08/06/18 1438 08/07/18 0416  WBC 12.2* 11.6*  HGB 15.1 13.9  HCT 45.6 40.7  PLT 201 207   BMET Recent Labs    08/06/18 1438 08/07/18 0416  NA  --  137  K  --  4.2  CL  --  105  CO2  --  24  GLUCOSE  --  130*  BUN  --  9  CREATININE 1.34* 1.27*  CALCIUM  --  9.1    COAG Lab Results  Component Value Date   INR 1.0 08/02/2018   INR 1.13 10/07/2013   INR 1.00 10/07/2013   No results found for: PTT

## 2018-08-10 ENCOUNTER — Inpatient Hospital Stay: Payer: Self-pay

## 2018-08-10 ENCOUNTER — Encounter (HOSPITAL_COMMUNITY)
Admission: RE | Disposition: A | Discharge status: Home Health | Payer: Self-pay | Source: Home / Self Care | Attending: Vascular Surgery

## 2018-08-10 ENCOUNTER — Inpatient Hospital Stay (HOSPITAL_COMMUNITY): Payer: Medicaid Other | Admitting: Certified Registered Nurse Anesthetist

## 2018-08-10 DIAGNOSIS — I70213 Atherosclerosis of native arteries of extremities with intermittent claudication, bilateral legs: Secondary | ICD-10-CM

## 2018-08-10 HISTORY — PX: FEMORAL-FEMORAL BYPASS GRAFT: SHX936

## 2018-08-10 HISTORY — PX: THROMBECTOMY FEMORAL ARTERY: SHX6406

## 2018-08-10 LAB — BASIC METABOLIC PANEL
Anion gap: 7 (ref 5–15)
BUN: 12 mg/dL (ref 6–20)
CO2: 29 mmol/L (ref 22–32)
Calcium: 9.5 mg/dL (ref 8.9–10.3)
Chloride: 104 mmol/L (ref 98–111)
Creatinine, Ser: 1.3 mg/dL — ABNORMAL HIGH (ref 0.61–1.24)
GFR calc Af Amer: >60 mL/min (ref >60)
GFR calc non Af Amer: >60 mL/min (ref >60)
Glucose, Bld: 98 mg/dL (ref 70–99)
Potassium: 4.6 mmol/L (ref 3.5–5.1)
Sodium: 140 mmol/L (ref 135–145)

## 2018-08-10 LAB — CBC
HCT: 41.9 % (ref 39.0–52.0)
Hemoglobin: 14.3 g/dL (ref 13.0–17.0)
MCH: 32.7 pg (ref 26.0–34.0)
MCHC: 34.1 g/dL (ref 30.0–36.0)
MCV: 95.9 fL (ref 80.0–100.0)
Platelets: 226 10*3/uL (ref 150–400)
RBC: 4.37 MIL/uL (ref 4.22–5.81)
RDW: 13.9 % (ref 11.5–15.5)
WBC: 6.7 10*3/uL (ref 4.0–10.5)
nRBC: 0 % (ref 0.0–0.2)

## 2018-08-10 LAB — VANCOMYCIN, PEAK: Vancomycin Pk: 32 ug/mL (ref 30–40)

## 2018-08-10 LAB — PROTIME-INR
INR: 1 (ref 0.8–1.2)
Prothrombin Time: 13.3 seconds (ref 11.4–15.2)

## 2018-08-10 SURGERY — CREATION, BYPASS, ARTERIAL, FEMORAL TO FEMORAL, USING GRAFT
Anesthesia: General | Site: Groin | Laterality: Bilateral

## 2018-08-10 MED ORDER — SODIUM CHLORIDE 0.9 % IV SOLN: 500.0000 mL | Freq: Once | INTRAVENOUS | Status: DC | PRN

## 2018-08-10 MED ORDER — CEFAZOLIN SODIUM-DEXTROSE 2-4 GM/100ML-% IV SOLN
2.0000 g | Freq: Three times a day (TID) | INTRAVENOUS | Status: AC
  Administered 2018-08-10 (×2): 2 g via INTRAVENOUS
  Filled 2018-08-10 (×2): qty 100

## 2018-08-10 MED ORDER — DEXAMETHASONE SODIUM PHOSPHATE 10 MG/ML IJ SOLN
INTRAMUSCULAR | Status: AC
  Filled 2018-08-10: qty 1

## 2018-08-10 MED ORDER — PROMETHAZINE HCL 25 MG/ML IJ SOLN: 6.2500 mg | INTRAMUSCULAR | Status: DC | PRN

## 2018-08-10 MED ORDER — METOPROLOL TARTRATE 5 MG/5ML IV SOLN
INTRAVENOUS | Status: AC
  Filled 2018-08-10: qty 5

## 2018-08-10 MED ORDER — ONDANSETRON HCL 4 MG/2ML IJ SOLN
INTRAMUSCULAR | Status: AC
  Filled 2018-08-10: qty 2

## 2018-08-10 MED ORDER — LIDOCAINE 2% (20 MG/ML) 5 ML SYRINGE
INTRAMUSCULAR | Status: AC
  Filled 2018-08-10: qty 5

## 2018-08-10 MED ORDER — PROTAMINE SULFATE 10 MG/ML IV SOLN
INTRAVENOUS | Status: DC | PRN
  Administered 2018-08-10: 20 mg via INTRAVENOUS
  Administered 2018-08-10: 50 mg via INTRAVENOUS

## 2018-08-10 MED ORDER — PROPOFOL 10 MG/ML IV BOLUS
INTRAVENOUS | Status: AC
  Filled 2018-08-10: qty 40

## 2018-08-10 MED ORDER — SODIUM CHLORIDE 0.9 % IV SOLN
INTRAVENOUS | Status: DC | PRN
  Administered 2018-08-10: 500 mL

## 2018-08-10 MED ORDER — HYDRALAZINE HCL 20 MG/ML IJ SOLN
10.0000 mg | Freq: Once | INTRAMUSCULAR | Status: AC
  Administered 2018-08-10: 10 mg via INTRAVENOUS

## 2018-08-10 MED ORDER — ALBUMIN HUMAN 5 % IV SOLN
INTRAVENOUS | Status: DC | PRN
  Administered 2018-08-10: 10:00:00 via INTRAVENOUS

## 2018-08-10 MED ORDER — HEPARIN SODIUM (PORCINE) 1000 UNIT/ML IJ SOLN
INTRAMUSCULAR | Status: DC | PRN
  Administered 2018-08-10: 7000 [IU] via INTRAVENOUS

## 2018-08-10 MED ORDER — ROCURONIUM BROMIDE 10 MG/ML (PF) SYRINGE
PREFILLED_SYRINGE | INTRAVENOUS | Status: AC
  Filled 2018-08-10: qty 10

## 2018-08-10 MED ORDER — HEPARIN SODIUM (PORCINE) 1000 UNIT/ML IJ SOLN
INTRAMUSCULAR | Status: AC
  Filled 2018-08-10: qty 1

## 2018-08-10 MED ORDER — PROTAMINE SULFATE 10 MG/ML IV SOLN
INTRAVENOUS | Status: AC
  Filled 2018-08-10: qty 5

## 2018-08-10 MED ORDER — HYDROMORPHONE HCL 1 MG/ML IJ SOLN
0.2500 mg | INTRAMUSCULAR | Status: DC | PRN
  Administered 2018-08-10 (×3): 0.5 mg via INTRAVENOUS

## 2018-08-10 MED ORDER — 0.9 % SODIUM CHLORIDE (POUR BTL) OPTIME
TOPICAL | Status: DC | PRN
  Administered 2018-08-10: 2000 mL
  Administered 2018-08-10: 3000 mL
  Administered 2018-08-10: 08:00:00 1000 mL

## 2018-08-10 MED ORDER — MIDAZOLAM HCL 2 MG/2ML IJ SOLN
INTRAMUSCULAR | Status: DC | PRN
  Administered 2018-08-10 (×2): 2 mg via INTRAVENOUS

## 2018-08-10 MED ORDER — SUCCINYLCHOLINE CHLORIDE 200 MG/10ML IV SOSY
PREFILLED_SYRINGE | INTRAVENOUS | Status: AC
  Filled 2018-08-10: qty 10

## 2018-08-10 MED ORDER — SODIUM CHLORIDE 0.9 % IV SOLN
INTRAVENOUS | Status: AC
  Filled 2018-08-10: qty 1.2

## 2018-08-10 MED ORDER — LACTATED RINGERS IV SOLN
INTRAVENOUS | Status: DC | PRN
  Administered 2018-08-10 (×2): via INTRAVENOUS

## 2018-08-10 MED ORDER — SODIUM CHLORIDE 0.9 % IV SOLN
INTRAVENOUS | Status: DC | PRN
  Administered 2018-08-10: 25 ug/min via INTRAVENOUS

## 2018-08-10 MED ORDER — ROCURONIUM BROMIDE 10 MG/ML (PF) SYRINGE
PREFILLED_SYRINGE | INTRAVENOUS | Status: DC | PRN
  Administered 2018-08-10: 20 mg via INTRAVENOUS
  Administered 2018-08-10: 90 mg via INTRAVENOUS
  Administered 2018-08-10: 20 mg via INTRAVENOUS

## 2018-08-10 MED ORDER — ONDANSETRON HCL 4 MG/2ML IJ SOLN
INTRAMUSCULAR | Status: DC | PRN
  Administered 2018-08-10: 4 mg via INTRAVENOUS

## 2018-08-10 MED ORDER — HYDROMORPHONE HCL 1 MG/ML IJ SOLN
INTRAMUSCULAR | Status: AC
  Filled 2018-08-10: qty 1

## 2018-08-10 MED ORDER — OXYCODONE-ACETAMINOPHEN 5-325 MG PO TABS
ORAL_TABLET | ORAL | Status: AC
  Filled 2018-08-10: qty 2

## 2018-08-10 MED ORDER — METOPROLOL TARTRATE 5 MG/5ML IV SOLN
INTRAVENOUS | Status: DC | PRN
  Administered 2018-08-10 (×2): 2.5 mg via INTRAVENOUS

## 2018-08-10 MED ORDER — SUGAMMADEX SODIUM 200 MG/2ML IV SOLN
INTRAVENOUS | Status: DC | PRN
  Administered 2018-08-10: 260 mg via INTRAVENOUS

## 2018-08-10 MED ORDER — PROPOFOL 10 MG/ML IV BOLUS
INTRAVENOUS | Status: DC | PRN
  Administered 2018-08-10: 100 mg via INTRAVENOUS

## 2018-08-10 MED ORDER — LIDOCAINE 2% (20 MG/ML) 5 ML SYRINGE
INTRAMUSCULAR | Status: DC | PRN
  Administered 2018-08-10: 100 mg via INTRAVENOUS

## 2018-08-10 MED ORDER — MIDAZOLAM HCL 2 MG/2ML IJ SOLN
INTRAMUSCULAR | Status: AC
  Filled 2018-08-10: qty 4

## 2018-08-10 MED ORDER — HYDRALAZINE HCL 20 MG/ML IJ SOLN
INTRAMUSCULAR | Status: AC
  Filled 2018-08-10: qty 1

## 2018-08-10 MED ORDER — DEXAMETHASONE SODIUM PHOSPHATE 10 MG/ML IJ SOLN
INTRAMUSCULAR | Status: DC | PRN
  Administered 2018-08-10: 10 mg via INTRAVENOUS

## 2018-08-10 MED ORDER — FENTANYL CITRATE (PF) 100 MCG/2ML IJ SOLN
INTRAMUSCULAR | Status: DC | PRN
  Administered 2018-08-10 (×2): 50 ug via INTRAVENOUS
  Administered 2018-08-10: 100 ug via INTRAVENOUS
  Administered 2018-08-10: 50 ug via INTRAVENOUS

## 2018-08-10 MED ORDER — FENTANYL CITRATE (PF) 250 MCG/5ML IJ SOLN
INTRAMUSCULAR | Status: AC
  Filled 2018-08-10: qty 5

## 2018-08-10 MED ORDER — PHENYLEPHRINE 40 MCG/ML (10ML) SYRINGE FOR IV PUSH (FOR BLOOD PRESSURE SUPPORT)
PREFILLED_SYRINGE | INTRAVENOUS | Status: DC | PRN
  Administered 2018-08-10: 160 ug via INTRAVENOUS
  Administered 2018-08-10: 80 ug via INTRAVENOUS
  Administered 2018-08-10: 120 ug via INTRAVENOUS
  Administered 2018-08-10: 160 ug via INTRAVENOUS
  Administered 2018-08-10: 80 ug via INTRAVENOUS

## 2018-08-10 MED ORDER — SODIUM CHLORIDE 0.9 % IV SOLN
INTRAVENOUS | Status: DC
  Administered 2018-08-10 – 2018-08-11 (×2): via INTRAVENOUS

## 2018-08-10 SURGICAL SUPPLY — 60 items
ADH SKN CLS APL DERMABOND .7 (GAUZE/BANDAGES/DRESSINGS) ×4
AGENT HMST SPONGE THK3/8 (HEMOSTASIS)
BAG ISL DRAPE 18X18 STRL (DRAPES) ×2
BAG ISOLATION DRAPE 18X18 (DRAPES) ×2 IMPLANT
CANISTER SUCT 3000ML PPV (MISCELLANEOUS) ×3 IMPLANT
CANNULA VESSEL 3MM 2 BLNT TIP (CANNULA) ×6 IMPLANT
CATH EMB 4FR 40CM (CATHETERS) ×1 IMPLANT
CLIP VESOCCLUDE MED 24/CT (CLIP) ×3 IMPLANT
CLIP VESOCCLUDE SM WIDE 24/CT (CLIP) ×3 IMPLANT
COVER PROBE W GEL 5X96 (DRAPES) ×1 IMPLANT
COVER WAND RF STERILE (DRAPES) ×3 IMPLANT
DERMABOND ADVANCED (GAUZE/BANDAGES/DRESSINGS) ×2
DERMABOND ADVANCED .7 DNX12 (GAUZE/BANDAGES/DRESSINGS) IMPLANT
DRAIN HEMOVAC 1/8 X 5 (WOUND CARE) IMPLANT
DRAPE ISOLATION BAG 18X18 (DRAPES) ×1
ELECT REM PT RETURN 9FT ADLT (ELECTROSURGICAL) ×3
ELECTRODE REM PT RTRN 9FT ADLT (ELECTROSURGICAL) ×2 IMPLANT
EVACUATOR SILICONE 100CC (DRAIN) IMPLANT
GLOVE BIO SURGEON STRL SZ 6 (GLOVE) ×3 IMPLANT
GLOVE BIO SURGEON STRL SZ7 (GLOVE) ×1 IMPLANT
GLOVE BIO SURGEON STRL SZ7.5 (GLOVE) ×4 IMPLANT
GLOVE BIOGEL PI IND STRL 6.5 (GLOVE) IMPLANT
GLOVE BIOGEL PI IND STRL 8 (GLOVE) IMPLANT
GLOVE BIOGEL PI IND STRL 9 (GLOVE) IMPLANT
GLOVE BIOGEL PI INDICATOR 6.5 (GLOVE) ×1
GLOVE BIOGEL PI INDICATOR 8 (GLOVE) ×1
GLOVE BIOGEL PI INDICATOR 9 (GLOVE) ×1
GOWN STRL REUS W/ TWL LRG LVL3 (GOWN DISPOSABLE) ×6 IMPLANT
GOWN STRL REUS W/ TWL XL LVL3 (GOWN DISPOSABLE) IMPLANT
GOWN STRL REUS W/TWL LRG LVL3 (GOWN DISPOSABLE) ×18
GOWN STRL REUS W/TWL XL LVL3 (GOWN DISPOSABLE) ×3
GRAFT CV 60X8STRG TUBE KNTD (Vascular Products) IMPLANT
GRAFT HEMASHIELD 8MM (Vascular Products) ×3 IMPLANT
HEMOSTAT SPONGE AVITENE ULTRA (HEMOSTASIS) IMPLANT
INSERT FOGARTY SM (MISCELLANEOUS) ×1 IMPLANT
KIT BASIN OR (CUSTOM PROCEDURE TRAY) ×3 IMPLANT
KIT TURNOVER KIT B (KITS) ×3 IMPLANT
LOOP VESSEL MINI RED (MISCELLANEOUS) ×3 IMPLANT
NS IRRIG 1000ML POUR BTL (IV SOLUTION) ×9 IMPLANT
PACK PERIPHERAL VASCULAR (CUSTOM PROCEDURE TRAY) ×3 IMPLANT
PAD ARMBOARD 7.5X6 YLW CONV (MISCELLANEOUS) ×6 IMPLANT
STAPLER VISISTAT 35W (STAPLE) IMPLANT
STOPCOCK 4 WAY LG BORE MALE ST (IV SETS) ×1 IMPLANT
SUT MNCRL AB 4-0 PS2 18 (SUTURE) ×2 IMPLANT
SUT PROLENE 5 0 C 1 24 (SUTURE) ×12 IMPLANT
SUT PROLENE 6 0 CC (SUTURE) ×2 IMPLANT
SUT SILK 3 0 (SUTURE)
SUT SILK 3-0 18XBRD TIE 12 (SUTURE) IMPLANT
SUT VIC AB 2-0 SH 27 (SUTURE) ×6
SUT VIC AB 2-0 SH 27XBRD (SUTURE) ×4 IMPLANT
SUT VIC AB 3-0 SH 27 (SUTURE) ×15
SUT VIC AB 3-0 SH 27X BRD (SUTURE) ×4 IMPLANT
SUT VICRYL 4-0 PS2 18IN ABS (SUTURE) ×6 IMPLANT
SYR 3ML LL SCALE MARK (SYRINGE) ×1 IMPLANT
SYR BULB IRRIGATION 50ML (SYRINGE) ×1 IMPLANT
TAPE UMBILICAL COTTON 1/8X30 (MISCELLANEOUS) ×1 IMPLANT
TOWEL GREEN STERILE (TOWEL DISPOSABLE) ×3 IMPLANT
TRAY FOLEY MTR SLVR 16FR STAT (SET/KITS/TRAYS/PACK) ×3 IMPLANT
UNDERPAD 30X30 (UNDERPADS AND DIAPERS) ×3 IMPLANT
WATER STERILE IRR 1000ML POUR (IV SOLUTION) ×3 IMPLANT

## 2018-08-10 NOTE — Progress Notes (Signed)
[facility]  Subjective  - sore incision   Objective (!) 145/89 96 (!) 97 F (36.1 C) 12 93%  Intake/Output Summary (Last 24 hours) at 08/10/2018 1428 Last data filed at 08/10/2018 1200 Gross per 24 hour  Intake 4169.38 ml  Output 2125 ml  Net 2044.38 ml   + graft pulse no hematoma  Assessment/Planning: Stable post op Will need PICC for home IV vanc.  6 weeks of therapy  Ruta Hinds 08/10/2018 2:28 PM --  Laboratory Lab Results: Recent Labs    08/10/18 0321  WBC 6.7  HGB 14.3  HCT 41.9  PLT 226   BMET Recent Labs    08/10/18 0321  NA 140  K 4.6  CL 104  CO2 29  GLUCOSE 98  BUN 12  CREATININE 1.30*  CALCIUM 9.5    COAG Lab Results  Component Value Date   INR 1.0 08/10/2018   INR 1.0 08/02/2018   INR 1.13 10/07/2013   No results found for: PTT

## 2018-08-10 NOTE — Anesthesia Preprocedure Evaluation (Signed)
Anesthesia Evaluation  Patient identified by MRN, date of birth, ID band Patient awake    Reviewed: Allergy & Precautions, NPO status , Patient's Chart, lab work & pertinent test results  Airway Mallampati: II  TM Distance: >3 FB Neck ROM: Full    Dental no notable dental hx.    Pulmonary Current Smoker,    Pulmonary exam normal breath sounds clear to auscultation       Cardiovascular + Peripheral Vascular Disease  Normal cardiovascular exam Rhythm:Regular Rate:Normal     Neuro/Psych Schizophrenia negative neurological ROS  negative psych ROS   GI/Hepatic Neg liver ROS, GERD  ,  Endo/Other  negative endocrine ROS  Renal/GU negative Renal ROS  negative genitourinary   Musculoskeletal negative musculoskeletal ROS (+)   Abdominal   Peds negative pediatric ROS (+)  Hematology negative hematology ROS (+)   Anesthesia Other Findings   Reproductive/Obstetrics negative OB ROS                             Anesthesia Physical Anesthesia Plan  ASA: III  Anesthesia Plan: General   Post-op Pain Management:    Induction: Intravenous  PONV Risk Score and Plan: 2 and Ondansetron, Dexamethasone and Treatment may vary due to age or medical condition  Airway Management Planned: Oral ETT  Additional Equipment:   Intra-op Plan:   Post-operative Plan: Extubation in OR  Informed Consent: I have reviewed the patients History and Physical, chart, labs and discussed the procedure including the risks, benefits and alternatives for the proposed anesthesia with the patient or authorized representative who has indicated his/her understanding and acceptance.     Dental advisory given  Plan Discussed with: CRNA and Surgeon  Anesthesia Plan Comments:         Anesthesia Quick Evaluation

## 2018-08-10 NOTE — Anesthesia Procedure Notes (Signed)
Procedure Name: Intubation Date/Time: 08/10/2018 7:37 AM Performed by: Leonor Liv, CRNA Pre-anesthesia Checklist: Patient identified, Emergency Drugs available, Suction available and Patient being monitored Patient Re-evaluated:Patient Re-evaluated prior to induction Oxygen Delivery Method: Circle System Utilized Preoxygenation: Pre-oxygenation with 100% oxygen Induction Type: IV induction Ventilation: Mask ventilation without difficulty Laryngoscope Size: Mac and 4 Grade View: Grade I Tube type: Oral Tube size: 7.5 mm Number of attempts: 1 Airway Equipment and Method: Stylet and Oral airway Placement Confirmation: ETT inserted through vocal cords under direct vision,  positive ETCO2 and breath sounds checked- equal and bilateral Secured at: 23 cm Tube secured with: Tape Dental Injury: Teeth and Oropharynx as per pre-operative assessment

## 2018-08-10 NOTE — Interval H&P Note (Signed)
History and Physical Interval Note:  08/10/2018 7:25 AM  James Stevens  has presented today for surgery, with the diagnosis of PERIPHERAL VASCULAR DISEASE.  The various methods of treatment have been discussed with the patient and family. After consideration of risks, benefits and other options for treatment, the patient has consented to  Procedure(s): RIGHT TO LEFT FEMORAL TO FEMORAL ARTERY BYPASS GRAFT (Bilateral) as a surgical intervention.  The patient's history has been reviewed, patient examined, no change in status, stable for surgery.  I have reviewed the patient's chart and labs.  Questions were answered to the patient's satisfaction.     Ruta Hinds

## 2018-08-10 NOTE — Anesthesia Postprocedure Evaluation (Signed)
Anesthesia Post Note  Patient: James Stevens  Procedure(s) Performed: RIGHT TO LEFT FEMORAL TO FEMORAL ARTERY BYPASS USING 47mm X 60cm HEMASHIELD GOLD GRAFT (Bilateral Groin) FEMORAL ARTERY THROMBECTOMY (Bilateral )     Patient location during evaluation: PACU Anesthesia Type: General Level of consciousness: awake and alert Pain management: pain level controlled Vital Signs Assessment: post-procedure vital signs reviewed and stable Respiratory status: spontaneous breathing, nonlabored ventilation, respiratory function stable and patient connected to nasal cannula oxygen Cardiovascular status: blood pressure returned to baseline and stable Postop Assessment: no apparent nausea or vomiting Anesthetic complications: no    Last Vitals:  Vitals:   08/10/18 1143 08/10/18 1200  BP: (!) 169/99 (!) 161/94  Pulse: 97 96  Resp: 19 19  Temp:    SpO2: 92% 92%    Last Pain:  Vitals:   08/10/18 1158  TempSrc:   PainSc: 9     LLE Motor Response: Purposeful movement;Responds to commands (08/10/18 1200) LLE Sensation: Full sensation (08/10/18 1200) RLE Motor Response: Purposeful movement;Responds to commands (08/10/18 1200) RLE Sensation: Full sensation (08/10/18 1200)      Donat Humble S

## 2018-08-10 NOTE — Transfer of Care (Signed)
Immediate Anesthesia Transfer of Care Note  Patient: James Stevens  Procedure(s) Performed: RIGHT TO LEFT FEMORAL TO FEMORAL ARTERY BYPASS USING 10mm X 60cm HEMASHIELD GOLD GRAFT (Bilateral Groin) FEMORAL ARTERY THROMBECTOMY (Bilateral )  Patient Location: PACU  Anesthesia Type:General  Level of Consciousness: drowsy  Airway & Oxygen Therapy: Patient Spontanous Breathing and Patient connected to nasal cannula oxygen  Post-op Assessment: Report given to RN and Post -op Vital signs reviewed and stable  Post vital signs: Reviewed and stable   Last Vitals:  Vitals Value Taken Time  BP 184/111 08/10/18 1113  Temp 36.1 C 08/10/18 1058  Pulse 84 08/10/18 1114  Resp 22 08/10/18 1114  SpO2 95 % 08/10/18 1114  Vitals shown include unvalidated device data.  Last Pain:  Vitals:   08/10/18 1058  TempSrc:   PainSc: Asleep      Patients Stated Pain Goal: 2 (19/62/22 9798)  Complications: No apparent anesthesia complications

## 2018-08-10 NOTE — Op Note (Signed)
Procedure: Right left femoral-femoral bypass   preoperative diagnosis: Claudication  Postoperative diagnosis: Same  Anesthesia: General  Assistant: Clotilde Dieterhris Clark, MD, Deniece ReeMary Neal, RNFA  Operative findings: 8 mm Dacron graft sewn end to side of distal portion of right limb of aortobifem and end-to-side of previously placed vein patch from left femoral endarterectomy  Operative details: After obtaining form consent, the patient in the operating.  The patient placed in supine position operating table.  After induction general anesthesia and endotracheal ovation, the patient was prepped and draped in usual sterile fashion from the umbilicus down to the toes.  Next a pre-existing right groin incision was reopened carried down through subcutaneous tissues all sutures were removed.  The patient had had a femoral endarterectomy and vein patch angioplasty of the common femoral artery earlier this week.  All of this was dissected free circumferentially and Vesseloops placed around the profunda superficial femoral and common femoral arteries.  There was some lymphatic drainage and this was all thoroughly irrigated with 4 L of normal saline solution.  There was no evidence of infection.  At this point longitudinal incision was made in the right groin through pre-existing scar.  There were fairly dense adhesions to the graft especially with the graft and the native proximal common femoral artery.  These were basically fused together and I was able to dissect out the anterior two thirds but could not separate the 2 structures.  Several side branches were dissected free circumferentially and Vesseloops placed around these.  The profunda femoris and superficial femoral arteries were dissected free circumferentially and Vesseloops placed around these as well.  Tunnel was then created subcutaneously over the fascia connecting the right groin to the left groin an 8 mm Dacron graft was pulled through this.  The patient was then  given 7000 units of intravenous heparin.  The right limb of the aortobifem and distal external iliac artery was controlled with peripheral DeBakey clamp.  The profunda and superficial femoral arteries were controlled with Vesseloops.  The hood of the aortobifem graft was opened up right above the anastomosis.  There was still some bleeding from the distal common femoral and this was controlled with a Fogarty catheter.  8 mm Dacron graft was then spatulated and sewn endograft to side of graft using a running 5-0 Prolene suture.  Despite completion anastomosis it was for blood backbled and thoroughly flushed reanastomosed was secured clamps released there is good pulsatile flow in the graft immediately.  Attention was then turned to the left groin.  The vein patch was opened longitudinally graft was cut to length and beveled and sewn endograft to side of vein patch using a running 5-0 Prolene suture.  At completion anastomosis it was for blood backbled and thoroughly flushed reanastomosed was secured clamps released there was some bleeding from the side branch of the left common femoral artery which was repaired with a 6-0 Prolene suture.  Otherwise everything was fairly hemostatic.  Patient was given 70 mg of protamine to assist with hemostasis.  Both groins were then closed in multiple layers with running 3-0 Vicryl suture in 2 layers followed by 4-0 Vicryl subcuticular stitch in the skin.  Dermabond was applied.  Patient had posterior tibial and dorsalis pedis Doppler signals bilaterally at the conclusion of the case.  There is good profunda flow and superficial femoral artery flow proximally by Doppler in both groins as well.  Patient tired procedure well and there were no complications.  The incident sponge and needle counts  correct in the case.  Patient was taken to recovery room in stable condition.  Ruta Hinds, MD [facility] [place] Office: [phone] Pager: [phone]

## 2018-08-11 LAB — BASIC METABOLIC PANEL
Anion gap: 11 (ref 5–15)
BUN: 11 mg/dL (ref 6–20)
CO2: 25 mmol/L (ref 22–32)
Calcium: 10 mg/dL (ref 8.9–10.3)
Chloride: 103 mmol/L (ref 98–111)
Creatinine, Ser: 1.2 mg/dL (ref 0.61–1.24)
GFR calc Af Amer: >60 mL/min (ref >60)
GFR calc non Af Amer: >60 mL/min (ref >60)
Glucose, Bld: 104 mg/dL — ABNORMAL HIGH (ref 70–99)
Potassium: 5.4 mmol/L — ABNORMAL HIGH (ref 3.5–5.1)
Sodium: 139 mmol/L (ref 135–145)

## 2018-08-11 LAB — CBC
HCT: 41.2 % (ref 39.0–52.0)
Hemoglobin: 13.9 g/dL (ref 13.0–17.0)
MCH: 32.9 pg (ref 26.0–34.0)
MCHC: 33.7 g/dL (ref 30.0–36.0)
MCV: 97.6 fL (ref 80.0–100.0)
Platelets: 214 10*3/uL (ref 150–400)
RBC: 4.22 MIL/uL (ref 4.22–5.81)
RDW: 13.7 % (ref 11.5–15.5)
WBC: 11.2 10*3/uL — ABNORMAL HIGH (ref 4.0–10.5)
nRBC: 0 % (ref 0.0–0.2)

## 2018-08-11 LAB — ANAEROBIC CULTURE: Gram Stain: NONE SEEN

## 2018-08-11 LAB — VANCOMYCIN, TROUGH: Vancomycin Tr: 6 ug/mL — ABNORMAL LOW (ref 15–20)

## 2018-08-11 MED ORDER — SODIUM CHLORIDE 0.9% FLUSH: 10.0000 mL | INTRAVENOUS | Status: DC | PRN

## 2018-08-11 MED ORDER — VANCOMYCIN HCL 10 G IV SOLR
1500.0000 mg | INTRAVENOUS | Status: DC
  Administered 2018-08-12: 14:00:00 1500 mg via INTRAVENOUS
  Filled 2018-08-11 (×2): qty 1500

## 2018-08-11 MED ORDER — SODIUM CHLORIDE 0.9% FLUSH
10.0000 mL | Freq: Two times a day (BID) | INTRAVENOUS | Status: DC
  Administered 2018-08-11 – 2018-08-12 (×3): 10 mL

## 2018-08-11 NOTE — Progress Notes (Signed)
PHARMACY CONSULT NOTE FOR:  OUTPATIENT  PARENTERAL ANTIBIOTIC THERAPY (OPAT)  Indication: graft infection Regimen: vancomycin 1500mg  IV q24h End date: 09/21/18  IV antibiotic discharge orders are pended. To discharging provider:  please sign these orders via discharge navigator,  Select New Orders & click on the button choice - Manage This Unsigned Work.     Thank you for allowing pharmacy to be a part of this patient's care.  Dareen Piano 08/11/2018, 6:52 PM

## 2018-08-11 NOTE — Progress Notes (Signed)
OT Cancellation Note  Patient Details Name: James Stevens MRN: [id number] DOB: [birth year]   Cancelled Treatment:    Reason Eval/Treat Not Completed: Patient at procedure or test/ unavailable pt having PICC line procedure. Will continue to follow and evaluate pt as time allows and pt is appropriate.   Dorinda Hill OTR/L Acute Rehabilitation Services Office: [phone]   Wyn Forster 08/11/2018, 2:07 PM

## 2018-08-11 NOTE — Progress Notes (Signed)
Pt ambulated x 470 feet around the unit pt tolerated well 

## 2018-08-11 NOTE — Progress Notes (Signed)
Pharmacy Antibiotic Note  James Stevens is a 58 y.o. 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  Pharmacy has been consulted for vancomycin dosing. Plans note for 4-6 week. He is on vancomycin 1250mg  IV q24h -vancomycin peak= 32 on 7/31, trough= 6 on 8/1. Calculated AUC= 409 -SCr= 1.2   Plan: -Will change vancomycin to 1500 IV q24hr (for an AUC of 490) -Plans noted for discharge soon    Height: 5\' 9"  (704.8 cm) Weight: 141 lb 1.5 oz (64 kg) IBW/kg (Calculated) : 70.7  Temp (24hrs), Avg:98 F (36.7 C), Min:97.8 F (36.6 C), Max:98.2 F (36.8 C)  Recent Labs  Lab 08/06/18 1438 08/07/18 0416 08/10/18 0321 08/10/18 2105 08/11/18 0301 08/11/18 1746  WBC 12.2* 11.6* 6.7  --  11.2*  --   CREATININE 1.34* 1.27* 1.30*  --  1.20  --   VANCOTROUGH  --   --   --   --   --  6*  VANCOPEAK  --   --   --  32  --   --     Estimated Creatinine Clearance: 60.7 mL/min (by C-G formula based on SCr of 1.2 mg/dL).    Allergies  Allergen Reactions  . Ibuprofen Other (See Comments)    Gastric irritation  . Lyrica [Pregabalin] Anxiety  . Tramadol Itching    Antimicrobials this admission: 7/27 cefazolin >> 7/27 7/27 vancomycin >>    Microbiology results: 7/27 wound culture x2 >> neg  Thank you for allowing pharmacy to be a part of this patient's care.  Hildred Laser, PharmD Clinical Pharmacist **Pharmacist phone directory can now be found on [email or web address] ([email or web address]).  Listed under [place].

## 2018-08-11 NOTE — Progress Notes (Addendum)
  Progress Note    08/11/2018 8:15 AM 1 Day Post-Op  Subjective:  No complaints; questions about IV abx  Afebrile HR 60's-80's NSR 536'R-443'X systolic 54% RA  Vitals:   08/10/18 2026 08/11/18 0305  BP: 132/77 113/72  Pulse: 66 81  Resp: 16 12  Temp: 97.8 F (36.6 C) 97.9 F (36.6 C)  SpO2: 95% 96%    Physical Exam: Cardiac:  regular Lungs:  Non labored Incisions:  Bilateral groins are clean and dry without hematoma Extremities:  Brisk doppler signals bilateral feet; palpable fem fem graft pulse.   CBC    Component Value Date/Time   WBC 11.2 (H) 08/11/2018 0301   RBC 4.22 08/11/2018 0301   HGB 13.9 08/11/2018 0301   HCT 41.2 08/11/2018 0301   PLT 214 08/11/2018 0301   MCV 97.6 08/11/2018 0301   MCH 32.9 08/11/2018 0301   MCHC 33.7 08/11/2018 0301   RDW 13.7 08/11/2018 0301   LYMPHSABS 2.6 04/09/2016 0129   MONOABS 0.8 04/09/2016 0129   EOSABS 0.1 04/09/2016 0129   BASOSABS 0.0 04/09/2016 0129    BMET    Component Value Date/Time   NA 139 08/11/2018 0301   K 5.4 (H) 08/11/2018 0301   CL 103 08/11/2018 0301   CO2 25 08/11/2018 0301   GLUCOSE 104 (H) 08/11/2018 0301   BUN 11 08/11/2018 0301   CREATININE 1.20 08/11/2018 0301   CALCIUM 10.0 08/11/2018 0301   GFRNONAA >60 08/11/2018 0301   GFRAA >60 08/11/2018 0301    INR    Component Value Date/Time   INR 1.0 08/10/2018 0321     Intake/Output Summary (Last 24 hours) at 08/11/2018 0815 Last data filed at 08/11/2018 0305 Gross per 24 hour  Intake 2155.95 ml  Output 3375 ml  Net -1219.05 ml     Assessment:  58 y.o. male is s/p:  Right left femoral-femoral bypass   1 Day Post-Op  Plan: -pt doing well this morning with brisk doppler signals bilateral feet -easily palpable graft pulse and bilateral groin incisions are clean and dry -had long discussion with pt about need for IV abx and consequences if graft bypass gets infected.  He expresses understanding.  Dr. Oneida Alar wants pt to have 6 weeks IV  Vanc.  Orders for outpatient parenteral abx therapy placed for pharmacy. -DVT prophylaxis:  Lovenox -continue mobilization   Leontine Locket, PA-C [facility] [phone] 08/11/2018 8:15 AM  I have examined the patient, reviewed and agree with above.  Easily palpable femorofemoral graft pulse.  James Jews, MD 08/11/2018 9:34 AM

## 2018-08-11 NOTE — Plan of Care (Signed)
  Problem: Education: Goal: Knowledge of General Education information will improve Description Including pain rating scale, medication(s)/side effects and non-pharmacologic comfort measures Outcome: Progressing   Problem: Health Behavior/Discharge Planning: Goal: Ability to manage health-related needs will improve Outcome: Progressing   

## 2018-08-11 NOTE — Progress Notes (Signed)
Peripherally Inserted Central Catheter/Midline Placement  The IV Nurse has discussed with the patient and/or persons authorized to consent for the patient, the purpose of this procedure and the potential benefits and risks involved with this procedure.  The benefits include less needle sticks, lab draws from the catheter, and the patient may be discharged home with the catheter. Risks include, but not limited to, infection, bleeding, blood clot (thrombus formation), and puncture of an artery; nerve damage and irregular heartbeat and possibility to perform a PICC exchange if needed/ordered by physician.  Alternatives to this procedure were also discussed.  Bard Power PICC patient education guide, fact sheet on infection prevention and patient information card has been provided to patient /or left at bedside.    PICC/Midline Placement Documentation  PICC Single Lumen 08/11/18 PICC Right Brachial 40 cm 0 cm (Active)  Indication for Insertion or Continuance of Line Home intravenous therapies (PICC only) 08/11/18 1208  Exposed Catheter (cm) 0 cm 08/11/18 1208  Site Assessment Clean;Dry;Intact 08/11/18 1208  Line Status Flushed;Saline locked;Blood return noted 08/11/18 1208  Dressing Type Transparent 08/11/18 1208  Dressing Status Clean;Dry;Intact;Antimicrobial disc in place 08/11/18 [place] checked and tightened 08/11/18 1208  Line Adjustment (NICU/IV Team Only) No 08/11/18 [place] dressing 08/11/18 1208  Dressing Change Due 08/18/18 08/11/18 1208       Rolena Infante 08/11/2018, 12:09 PM

## 2018-08-11 NOTE — TOC Initial Note (Signed)
Transition of Care [facility]) - Initial/Assessment Note    Patient Details  Name: James Stevens MRN: [id number] Date of Birth: 1960-11-27  [facility] of [facility]) CM/SW Contact:    Claudie Leach, RN Phone Number: 08/11/2018, 12:17 PM  Clinical Narrative:                 Patient to d/c home with home health/IV antibiotics for 6 weeks.  Planning d/c for tomorrow or Monday.  Carolynn Sayers, RN with [facility] infusion accepts referral and will set patient up with [facility] for RN.    Patient states he lives with mother who is able to assist somewhat.  He has a Optician, dispensing through [facility] for 3 hours/day.  Her times are varied, but she can be scheduled for consistent times to be present at dosage time if needed.  His stepfather is available for transportation when needed.  Patient has DME cane, walker, rollator, WC, crutches, and shower chair.  He requests a 3n1 as his toilet seat is too low and he needs a higher seat with armrests.  3n1 ordered to be delivered to room today.   Expected Discharge Plan: [place] Barriers to Discharge: No Barriers Identified   Patient Goals and CMS Choice Patient states their goals for this hospitalization and ongoing recovery are:: to get well CMS Medicare.gov Compare Post Acute Care list provided to:: Patient Choice offered to / list presented to : Patient  Expected Discharge Plan and Services Expected Discharge Plan: [place] Choice: [place] arrangements for the past 2 months: (Mother)                 DME Arranged: 3-N-1 DME Agency: [employer] Date DME Agency Contacted: 08/11/18 Time DME Agency Contacted: 1215 Representative spoke with at DME Agency: [place]: RN [place] Agency: [place]) Date [place]: 08/11/18 Time [place]: [place] Representative spoke with at [place]: Carolynn Sayers  Prior Living  Arrangements/Services Living arrangements for the past 2 months: (Mother) Lives with:: Other (Comment) Patient language and need for interpreter reviewed:: Yes Do you feel safe going back to the place where you live?: Yes      Need for Family Participation in Patient Care: Yes (Comment) Care giver support system in place?: Yes (comment) Current home services: Homehealth aide, DME(WC, walker, rollator, shower chair, crutches, cane.) Criminal Activity/Legal Involvement Pertinent to Current Situation/Hospitalization: No - Comment as needed  AEmotional Assessment Appearance:: Appears older than stated age Attitude/Demeanor/Rapport: Gracious Affect (typically observed): Pleasant Orientation: : Oriented to Self, Oriented to Place, Oriented to  Time, Oriented to Situation Alcohol / Substance Use: Not Applicable Psych Involvement: No (comment)  Admission diagnosis:  PERIPHERAL VASCULAR DISEASE WITH CLAUDICATION Patient Active Problem List   Diagnosis Date Noted  . 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 History of bilateral hip replacements 07/03/2017  . Bilateral hip pain 07/03/2017  . Lumbar herniated disc 03/23/2016  . 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 HNP (herniated nucleus pulposus), lumbar 05/05/2014    Class: Chronic  . PVD (peripheral vascular disease) ([place]) 10/30/2013  . 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 Atherosclerosis of native arteries of the extremities with intermittent claudication 09/05/2013  . Peripheral vascular disease, unspecified ([place]) 08/29/2013  . Avascular necrosis of hip ([place]) 04/17/2012   PCP:  Jeanie Cooks,  Dorma RussellEdwin, MD Pharmacy:   [facility] [address] - [place], [place] - [address] [place] [address] Phone: [phone] Fax: [phone]  [id number] [place]- [place], [place] - [address] [facility] [address] [place] [id number] Phone: [phone] Fax: [phone]

## 2018-08-11 NOTE — Progress Notes (Signed)
Spoke with pt at bedside.  Verbalizes understanding of PICC, but prefers to speak with MD re possibility of po antibiotics rather than PICC for 6 weeks. RN aware to notify this writer once decision made  And pt agreeable.  Pt very pleasant and cooperative- not refusing, just wants questions answered first.

## 2018-08-12 ENCOUNTER — Other Ambulatory Visit: Payer: Self-pay

## 2018-08-12 ENCOUNTER — Encounter (HOSPITAL_COMMUNITY): Payer: Self-pay | Admitting: *Deleted

## 2018-08-12 ENCOUNTER — Emergency Department (HOSPITAL_COMMUNITY)
Admission: EM | Admit: 2018-08-12 | Discharge: 2018-08-13 | Disposition: A | Discharge status: Home / Self Care | Payer: Medicaid Other | Attending: Emergency Medicine | Admitting: Emergency Medicine

## 2018-08-12 DIAGNOSIS — E860 Dehydration: Secondary | ICD-10-CM | POA: Diagnosis not present

## 2018-08-12 DIAGNOSIS — I2510 Atherosclerotic heart disease of native coronary artery without angina pectoris: Secondary | ICD-10-CM | POA: Insufficient documentation

## 2018-08-12 DIAGNOSIS — Z96641 Presence of right artificial hip joint: Secondary | ICD-10-CM | POA: Insufficient documentation

## 2018-08-12 DIAGNOSIS — F17210 Nicotine dependence, cigarettes, uncomplicated: Secondary | ICD-10-CM | POA: Insufficient documentation

## 2018-08-12 DIAGNOSIS — G8918 Other acute postprocedural pain: Secondary | ICD-10-CM | POA: Insufficient documentation

## 2018-08-12 LAB — CBC
HCT: 41 % (ref 39.0–52.0)
Hemoglobin: 13.5 g/dL (ref 13.0–17.0)
MCH: 32.5 pg (ref 26.0–34.0)
MCHC: 32.9 g/dL (ref 30.0–36.0)
MCV: 98.8 fL (ref 80.0–100.0)
Platelets: 212 10*3/uL (ref 150–400)
RBC: 4.15 MIL/uL — ABNORMAL LOW (ref 4.22–5.81)
RDW: 13.7 % (ref 11.5–15.5)
WBC: 11.5 10*3/uL — ABNORMAL HIGH (ref 4.0–10.5)
nRBC: 0 % (ref 0.0–0.2)

## 2018-08-12 LAB — BASIC METABOLIC PANEL
Anion gap: 10 (ref 5–15)
BUN: 12 mg/dL (ref 6–20)
CO2: 25 mmol/L (ref 22–32)
Calcium: 9.4 mg/dL (ref 8.9–10.3)
Chloride: 102 mmol/L (ref 98–111)
Creatinine, Ser: 1.55 mg/dL — ABNORMAL HIGH (ref 0.61–1.24)
GFR calc Af Amer: 56 mL/min — ABNORMAL LOW (ref >60)
GFR calc non Af Amer: 49 mL/min — ABNORMAL LOW (ref >60)
Glucose, Bld: 129 mg/dL — ABNORMAL HIGH (ref 70–99)
Potassium: 4.2 mmol/L (ref 3.5–5.1)
Sodium: 137 mmol/L (ref 135–145)

## 2018-08-12 MED ORDER — LACTULOSE 10 GM/15ML PO SOLN
10.0000 g | Freq: Every day | ORAL | Status: AC
  Administered 2018-08-12: 09:00:00 10 g via ORAL
  Filled 2018-08-12: qty 15

## 2018-08-12 MED ORDER — VANCOMYCIN IV (FOR PTA / DISCHARGE USE ONLY): 1500.0000 mg | INTRAVENOUS | 0 refills | Status: DC

## 2018-08-12 MED ORDER — SODIUM CHLORIDE 0.9% FLUSH: 3.0000 mL | Freq: Once | INTRAVENOUS | Status: DC

## 2018-08-12 MED ORDER — ASPIRIN EC 81 MG PO TBEC: 81.0000 mg | DELAYED_RELEASE_TABLET | Freq: Every day | ORAL | Status: DC

## 2018-08-12 NOTE — ED Triage Notes (Signed)
Pt from home after being discharged around 5pm today post op, bilateral femoral artery bypass. When pt got home, had lightheadedness when walking up the steps had to grab onto the knob of door to balance himself. Had another near syncopal episode in the house in which ems was called. EMS reports very faint radial pulses on arrival; received 166ml NS with improvement of palpable pulses. Pt tender to lower abd, more tender to R groin than L. Incision sites appear to be healing appropriately. PICC to L upper arm for home course of IV antiobiotics. IV placed to L [facility]

## 2018-08-12 NOTE — ED Provider Notes (Signed)
[facility] EMERGENCY DEPARTMENT Provider Note   CSN: [id number] Arrival date & time: 08/12/18  2231    History   Chief Complaint Chief Complaint  Patient presents with   Post-op Problem   Near Syncope    HPI James Stevens is a 58 y.o. male with history of schizophrenia, PVD, GERD who presents to the emergency department with a chief complaint of near syncope.  The patient was discharged from the hospital at 1700.  Upon arriving home, the patient attempted to climb the stairs into his home.  By the time he reached the top of the stairs he was dizzy, lightheaded, and felt as if he was going to pass out.  He was able to make it inside the house and his symptoms resolved after sitting down.  He reports that he attempted to stand after getting off of the couch and symptoms immediately resumed.   He reports that he has been having severe low back and abdominal pain.  He reports that the pain is bilateral and wraps around his low back to his bilateral lower abdomen.  He reports that he has not had a bowel movement in 1 week, which is very unusual for him as he typically will have an episode of diarrhea almost after every meal that he eats.  He had an excision segment of the left lower limb with an aortobifemoral bypass graft, left femoral endarterectomy, and vein patch angioplasty of the left common femoral artery  that was complicated by infected prosthesis and the patient was started on vancomycin.  He then had surgery with Dr. Oneida Alar on July 31 for right to left fem-fem bypass.  He was discharged home with PICC line for IV vancomycin.  He denies fever, chills, chest pain, shortness of breath, numbness, weakness, dysuria, hematuria, diarrhea, penile or testicular pain or swelling, headache, neck pain, cyanosis.      The history is provided by the patient. No language interpreter was used.    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   History of bilateral hip replacements 07/03/2017   Bilateral hip pain 07/03/2017   Lumbar herniated disc 03/23/2016   Spinal stenosis, lumbar region, with neurogenic claudication 05/05/2014    Class: Chronic   HNP (herniated nucleus pulposus), lumbar 05/05/2014    Class: Chronic   PVD (peripheral vascular disease) ([place]) 10/30/2013   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   Atherosclerosis of native arteries of the extremities with intermittent claudication 09/05/2013   Peripheral vascular disease, unspecified ([place]) 08/29/2013   Avascular necrosis of 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[place]) 04/17/2012    Past Surgical History:  Procedure Laterality Date   ABDOMINAL AORTOGRAM W/LOWER EXTREMITY Bilateral 06/29/2018   Procedure: ABDOMINAL AORTOGRAM W/LOWER EXTREMITY;  Surgeon: Elam Dutch, MD;  Location: [facility];  Service: Cardiovascular;  Laterality: Bilateral;   AORTA - BILATERAL FEMORAL ARTERY BYPASS GRAFT N/A 10/07/2013   Procedure: AORTA BIFEMORAL BYPASS GRAFT;  Surgeon: Elam Dutch, MD;  Location: [facility] OR;  Service: Vascular;  Laterality: N/A;   ENDARTERECTOMY FEMORAL Left 08/06/2018   Procedure: Left Common Femoral Endarterectomy;  Surgeon: Elam Dutch, MD;  Location: [facility] OR;  Service: Vascular;  Laterality: Left;   ESOPHAGEAL DILATION     ESOPHAGOGASTRODUODENOSCOPY  09/06/2011   Procedure: ESOPHAGOGASTRODUODENOSCOPY (EGD);  Surgeon: Winfield Cunas., MD;  Location: [facility];  Service: Endoscopy;  Laterality: N/A;    EYE SURGERY     FEMORAL-FEMORAL BYPASS GRAFT Left 08/06/2018   Procedure: Ligation and Removal of Left Limb of Aortobifemoral Bypass Graft;  Surgeon: Elam Dutch, MD;  Location: [facility] OR;  Service: Vascular;  Laterality: Left;   FRACTURE SURGERY     left fingers # 4,5   JOINT REPLACEMENT Bilateral    rt hip replacement, and  LT hip   LUMBAR LAMINECTOMY N/A 05/05/2014   Procedure: BILATERAL LUMBAR LAMINECTOMY L4-5 AND L5-S1;  Surgeon: Jessy Oto, MD;  Location: [facility];  Service: Orthopedics;  Laterality: N/A;   MANDIBLE FRACTURE SURGERY     PATCH ANGIOPLASTY Left 08/06/2018   Procedure: Patch Angioplasty Left Common Femoral Artery with Greater Saphenous Vein;  Surgeon: Elam Dutch, MD;  Location: [facility];  Service: Vascular;  Laterality: Left;   SAVORY DILATION  09/06/2011   Procedure: Azzie Almas DILATION;  Surgeon: Winfield Cunas., MD;  Location: [facility];  Service: Endoscopy;  Laterality: N/A;   TOTAL HIP ARTHROPLASTY Left 04/17/2012   Procedure: LEFT TOTAL HIP ARTHROPLASTY ANTERIOR APPROACH;  Surgeon: Mcarthur Rossetti, MD;  Location: [facility];  Service: Orthopedics;  Laterality: Left;        Home Medications    Prior to Admission medications   Medication Sig Start Date End Date Taking? Authorizing Provider  oxyCODONE-acetaminophen (PERCOCET/ROXICET) 5-325 MG tablet Take 1 tablet by mouth 3 (three) times daily as needed for severe pain.    Yes [provider]  aspirin EC 81 MG tablet Take 1 tablet (81 mg total) by mouth daily. Patient not taking: Reported on 08/13/2018 08/12/18   Gabriel Earing, PA-C  vancomycin IVPB Inject 1,500 mg into the vein daily. Indication:  Graft infection Last Day of Therapy:   09/21/18 Labs - Sunday/Monday:  CBC/D, BMP, and vancomycin trough. Labs - Thursday:  BMP and vancomycin trough Labs - Every other week:  ESR and CRP Patient not taking: Reported on 08/13/2018 08/12/18   Gabriel Earing, PA-C  rosuvastatin (CRESTOR) 40 MG  tablet Take 1 tablet (40 mg total) by mouth daily. Patient not taking: Reported on 07/31/2018 07/05/18 08/13/18  Lelon Perla, MD    Family History Family History  Problem Relation Age of Onset   Diabetes Mother    Diabetes Father    Diabetes Other    Diabetes Brother     Social History Social History   Tobacco Use   Smoking status: Current Every Day Smoker    Packs/day: 0.20    Years: 34.00    Pack years: 6.80    Types: Cigarettes   Smokeless tobacco: Never Used   Tobacco comment: 3-4 cigarettes per day  Substance Use Topics   Alcohol use: Yes    Comment: once a week    Drug use: Yes    Frequency: 2.0 times per week    Types: Cocaine, Marijuana     Allergies   Ibuprofen, Lyrica [pregabalin], and Tramadol   Review of Systems Review of Systems  Constitutional: Negative for appetite change  and fever.  Respiratory: Negative for shortness of breath.   Cardiovascular: Negative for chest pain.  Gastrointestinal: Positive for abdominal pain and constipation. Negative for diarrhea and vomiting.  Genitourinary: Negative for dysuria and penile pain.  Musculoskeletal: Positive for back pain. Negative for myalgias, neck pain and neck stiffness.  Skin: Negative for rash.  Allergic/Immunologic: Negative for immunocompromised state.  Neurological: Positive for dizziness, syncope (near) and light-headedness. Negative for weakness and headaches.  Psychiatric/Behavioral: Negative for confusion. The patient is not nervous/anxious and is not hyperactive.     Physical Exam Updated Vital Signs BP (!) 156/82    Pulse 96    Temp 98.2 F (36.8 C) (Oral)    Resp 17    SpO2 99%   Physical Exam Vitals signs and nursing note reviewed.  Constitutional:      General: He is not in acute distress.    Appearance: He is well-developed. He is not ill-appearing, toxic-appearing or diaphoretic.  HENT:     Head: Normocephalic.  Eyes:     Extraocular Movements: Extraocular movements  intact.     Conjunctiva/sclera: Conjunctivae normal.     Pupils: Pupils are equal, round, and reactive to light.  Neck:     Musculoskeletal: Neck supple.  Cardiovascular:     Rate and Rhythm: Normal rate and regular rhythm.     Pulses: Normal pulses.     Heart sounds: Normal heart sounds. No murmur. No friction rub. No gallop.      Comments: Radial pulses and DP pulses are 2+ bilaterally.  Upper and lower extremities are warm to the touch. Pulmonary:     Effort: Pulmonary effort is normal. No respiratory distress.     Breath sounds: No stridor. No wheezing, rhonchi or rales.  Chest:     Chest wall: No tenderness.  Abdominal:     General: There is no distension.     Palpations: Abdomen is soft. There is no mass.     Tenderness: There is abdominal tenderness. There is no right CVA tenderness, left CVA tenderness, guarding or rebound.     Hernia: No hernia is present.     Comments: Tender to palpation over the bilateral lower abdomen.  Abdomen is soft, mildly distended.  No tenderness over McBurney's point.  No CVA tenderness.  Musculoskeletal:     Comments: Mild swelling over the mons pubis without warmth or redness.  Bilateral femoral incision sites are well healing with Dermabond in place.  No surrounding redness, warmth, or purulent drainage.  Tender to palpation over the bilateral SI joints and diffusely over the lumbar musculature.  Decreased strength against resistance of bilateral leg secondary to pain.  Sensation is intact and equal throughout.  Independently moves all digits of the bilateral feet.  Skin:    General: Skin is warm and dry.     Capillary Refill: Capillary refill takes less than 2 seconds.     Coloration: Skin is not jaundiced.     Findings: No erythema.  Neurological:     Mental Status: He is alert.  Psychiatric:        Behavior: Behavior normal.      ED Treatments / Results  Labs (all labs ordered are listed, but only abnormal results are displayed) Labs  Reviewed  BASIC METABOLIC PANEL - Abnormal; Notable for the following components:      Result Value   Glucose, Bld 129 (*)    Creatinine, Ser 1.55 (*)    GFR calc non Af Amer 49 (*)  GFR calc Af Amer 56 (*)    All other components within normal limits  CBC - Abnormal; Notable for the following components:   WBC 11.5 (*)    RBC 4.15 (*)    All other components within normal limits  URINALYSIS, ROUTINE W REFLEX MICROSCOPIC - Abnormal; Notable for the following components:   Color, Urine STRAW (*)    All other components within normal limits    EKG EKG Interpretation  Date/Time:  Sunday August 12 2018 22:39:19 EDT Ventricular Rate:  114 PR Interval:    QRS Duration: 83 QT Interval:  322 QTC Calculation: 444 R Axis:   83 Text Interpretation:  Sinus tachycardia Biatrial enlargement RSR' in V1 or V2, probably normal variant Borderline T wave abnormalities Confirmed by Fredia Sorrow [phone]) on 08/12/2018 10:46:11 PM Also confirmed by Fredia Sorrow [phone]), editor Philomena Doheny [phone])  on 08/13/2018 7:15:24 AM   Radiology Dg Abdomen Acute W/chest  Result Date: 08/13/2018 CLINICAL DATA:  Abdominal pain for 1 week EXAM: DG ABDOMEN ACUTE W/ 1V CHEST COMPARISON:  None. FINDINGS: Cardiac shadows within normal limits. The lungs are clear bilaterally. Right-sided PICC line is noted at the cavoatrial junction. No bony abnormality is seen. Lower abdominal fusion and bilateral hip replacements are seen. Scattered large and small bowel gas is noted. Mild retained fecal material in the colon is seen consistent with mild constipation. No free air is noted. No abnormal mass or abnormal calcifications are seen. IMPRESSION: No acute abnormality in the chest. Mild constipation. Electronically Signed   By: Inez Catalina M.D.   On: 08/13/2018 00:35   Ct Angio Abd/pel W And/or Wo Contrast  Result Date: 08/13/2018 CLINICAL DATA:  Recent femoral artery bypass. Groin tenderness. Dizziness. EXAM: CTA ABDOMEN  AND PELVIS wITHOUT AND WITH CONTRAST TECHNIQUE: Multidetector CT imaging of the abdomen and pelvis was performed using the standard protocol during bolus administration of intravenous contrast. Multiplanar reconstructed images and MIPs were obtained and reviewed to evaluate the vascular anatomy. CONTRAST:  178m OMNIPAQUE IOHEXOL 350 MG/ML SOLN COMPARISON:  08/07/2018 FINDINGS: VASCULAR Aorta: Mild atherosclerotic change in the suprarenal portion. No aneurysm. Infrarenal aortobifemoral graft with proximal occlusion of the left limb, stable since prior study. Celiac: Patent without evidence of aneurysm, dissection, vasculitis or significant stenosis. SMA: Patent without evidence of aneurysm, dissection, vasculitis or significant stenosis. Renals: Both renal arteries are patent without evidence of aneurysm, dissection, vasculitis, fibromuscular dysplasia or significant stenosis. IMA: Fills via collaterals. Inflow: Patent right limb of the aortobifemoral graft with widely patent distal anastomosis. Occluded left limb. Proximal Outflow: New fem-fem crossover graft noted, patent. Patent proximal SFA bilaterally. Veins: No obvious venous abnormality within the limitations of this arterial phase study. Review of the MIP images confirms the above findings. NON-VASCULAR Lower chest: 1 Lung bases are clear. No effusions. Heart is normal size. Hepatobiliary: No focal hepatic abnormality. Gallbladder unremarkable. Pancreas: No focal abnormality or ductal dilatation. Spleen: No focal abnormality.  Normal size. Adrenals/Urinary Tract: Right midpole renal cyst. No hydronephrosis. No suspicious renal or adrenal lesion. Urinary bladder obscured by beam hardening artifact from bilateral hip replacements. Stomach/Bowel: Stomach, large and small bowel grossly unremarkable. Lymphatic: No adenopathy. Reproductive: Obscured by beam hardening artifact from bilateral hip replacements. Other: No visible free fluid or free air.  Musculoskeletal: No acute bony abnormality. Prior bilateral hip replacements and posterior fusion changes in the lower lumbar spine. IMPRESSION: VASCULAR Remote aortobifemoral bypass with occlusion of the proximal left limb, stable since recent study. Interval fem-fem crossover graft  which appears patent and grossly unremarkable. NON-VASCULAR No acute findings in the abdomen or pelvis. Electronically Signed   By: Rolm Baptise M.D.   On: 08/13/2018 02:33    Procedures Procedures (including critical care time)  Medications Ordered in ED Medications  sodium chloride flush (NS) 0.9 % injection 3 mL (has no administration in time range)  fentaNYL (SUBLIMAZE) injection 75 mcg (has no administration in time range)  fentaNYL (SUBLIMAZE) injection 50 mcg (50 mcg Intravenous Given 08/13/18 0149)  sodium chloride 0.9 % bolus 1,000 mL (0 mLs Intravenous Stopped 08/13/18 0725)  iohexol (OMNIPAQUE) 350 MG/ML injection 100 mL (100 mLs Intravenous Contrast Given 08/13/18 0159)  oxyCODONE-acetaminophen (PERCOCET/ROXICET) 5-325 MG per tablet 1 tablet (1 tablet Oral Given 08/13/18 0723)     Initial Impression / Assessment and Plan / ED Course  I have reviewed the triage vital signs and the nursing notes.  Pertinent labs & imaging results that were available during my care of the patient were reviewed by me and considered in my medical decision making (see chart for details).        58 year old male with history of schizophrenia, PVD, GERD presenting by EMS after being discharged from the hospital earlier this afternoon.  After discharge, he developed dizziness and lightheadedness after climbing the stairs that resolved with sitting, but had a second episode when he attempted to stand and ambulate again.  No chest pain.  No constitutional symptoms.  Tachycardic on arrival.  The patient was seen and independently evaluated by Dr. Stark Jock, attending physician.  Creatinine is 1.55, up from 1.20 yesterday.  Suspect  dehydration.  Will treat with IV fluid bolus.  He was given fentanyl with good improvement in his pain.  Given recent femorofemoral bypass surgery, will repeat CT angios of the abdomen and pelvis.  Repeat angio is unchanged from previous.  Discussed these findings with the patient. Will ensure that he is not orthostatic and ensure he is asymptomatic when he ambulated throughout the department.  After multiple attempts to stand and ambulate the patient, he was successful with a walker, which is his baseline.  He is asymptomatic.  He was also able to change from sitting to standing independently.  He is hemodynamically stable and in no acute distress.  We will discharge the patient home with vascular surgery follow-up.  Final Clinical Impressions(s) / ED Diagnoses   Final diagnoses:  Dehydration  Post-operative pain    ED Discharge Orders    None       Joanne Gavel, PA-C 08/13/18 0626    Veryl Speak, MD 08/13/18 [id number]    Veryl Speak, MD 08/13/18 2309

## 2018-08-12 NOTE — Discharge Summary (Signed)
Discharge Summary     James Stevens 1960/10/25 58 y.o. male  [id number]  Admission Date: 08/06/2018  Discharge Date: 08/12/2018  Physician: Elam Dutch, MD  Admission Diagnosis: PERIPHERAL VASCULAR DISEASE WITH CLAUDICATION  HPI:   This is a 58 y.o. male status post aortobifemoral bypass graft 2015.  He recently noticed his left leg was becoming weak again.  He had an arteriogram recently that showed the left limb of his aortobifemoral bypass graft is now occluded.  Visit today was to discuss further the test results of his cardiac testing as well as operative plan.  His recent cardiac stress test was negative.  He is on aspirin and statin.  He still has claudication in the left leg but no rest pain.  He has overall weakness due to back issues as well and is in a wheelchair.  He does use his legs to transfer.  He states that he thinks he would be able to walk further if he was able to have improved strength in his left leg.  View of systems: He has no shortness of breath.  He has no chest pain.  He is having intermittent diarrhea.  Hospital Course:  The patient was admitted to the hospital and taken to the operating room on 08/06/2018 and underwent: Excision segment left limb aortobifemoral bypass graft, left femoral endarterectomy, vein patch angioplasty left common femoral artery    Findings: Operative findings: #1 purulent collection adjacent to the left limb of aortobifemoral bypass graft 2 to 3 cc of yellow mucopurulent material culture showed white blood cells on Gram stain intraoperatively  The pt tolerated the procedure well and was transported to the PACU in good condition.   By POD 1, he had monophasic DP/PT left foot and brisk doppler signals in the right DP/PT.  Incisions look good.  No organisms or WBC on gram stain.  CTA ordered to evaluate for graft infection.  This was reviewed and no other evidence of graft infection.  Continue abx and plan for femoral to  femoral bypass later in the week.  ABI's on 08/07/2018: +-------+-----------+-----------+------------+------------+  ABI/TBI Today's ABI Today's TBI Previous ABI Previous TBI  +-------+-----------+-----------+------------+------------+  Right   0.66        0.49        0.76                       +-------+-----------+-----------+------------+------------+  Left    0.5                     0.51                        POD 2, his pain medication dose was increased.  Otherwise, doing well.   Final cx were negative.   On 08/10/2018, he was taken to the operating room and underwent right to left femoral to femoral bypass grafting.    Operative findings: 8 mm Dacron graft sewn end to side of distal portion of right limb of aortobifem and end-to-side of previously placed vein patch from left femoral endarterectomy.  Given the findings intraoperatively despite the negative cx, Dr. Oneida Alar wants pt to have IV Vanc x 6 weeks.    On 08/11/2018, pt doing well.  Long discussion with pt about reasoning for IV abx and consequences if graft becomes infected.  PICC line placed.   On 08/12/2018, pt doing well with good doppler flow bilateral feet.  Graft  is easily palpable.  HHRN and equipment as well as [place] arranged.  He was discharged home later that day.    The remainder of the hospital course consisted of increasing mobilization and increasing intake of solids without difficulty.  CBC    Component Value Date/Time   WBC 11.2 (H) 08/11/2018 0301   RBC 4.22 08/11/2018 0301   HGB 13.9 08/11/2018 0301   HCT 41.2 08/11/2018 0301   PLT 214 08/11/2018 0301   MCV 97.6 08/11/2018 0301   MCH 32.9 08/11/2018 0301   MCHC 33.7 08/11/2018 0301   RDW 13.7 08/11/2018 0301   LYMPHSABS 2.6 04/09/2016 0129   MONOABS 0.8 04/09/2016 0129   EOSABS 0.1 04/09/2016 0129   BASOSABS 0.0 04/09/2016 0129    BMET    Component Value Date/Time   NA 139 08/11/2018 0301   K 5.4 (H) 08/11/2018 0301   CL 103 08/11/2018 0301    CO2 25 08/11/2018 0301   GLUCOSE 104 (H) 08/11/2018 0301   BUN 11 08/11/2018 0301   CREATININE 1.20 08/11/2018 0301   CALCIUM 10.0 08/11/2018 0301   GFRNONAA >60 08/11/2018 0301   GFRAA >60 08/11/2018 0301     Discharge Instructions    Discharge patient   Complete by: As directed    Discharge home after Dr. Donnetta Hutching sees pt and as long as all HH needs including RN and IV abx/labs are arranged.  Thanks   Discharge disposition: 01-Home or Self Care   Discharge patient date: 08/12/2018   Home infusion instructions [place] May follow [place] Dosing Protocol; May administer Cathflo as needed to maintain patency of vascular access device.; [place] of vascular access device: per [facility] Protocol: 0.9% NaCl pre/post medica...   Complete by: As directed    Instructions: May follow [place] Dosing Protocol   Instructions: May administer Cathflo as needed to maintain patency of vascular access device.   Instructions: [place] of vascular access device: per [facility] Protocol: 0.9% NaCl pre/post medication administration and prn patency; Heparin 100 u/ml, 52m for implanted ports and Heparin 10u/ml, 59mfor all other central venous catheters.   Instructions: May follow [facility] Anaphylaxis Protocol for First Dose Administration in the home: 0.9% NaCl at 25-50 ml/hr to maintain IV access for protocol meds. Epinephrine 0.3 ml IV/IM PRN and Benadryl 25-50 IV/IM PRN s/s of anaphylaxis.   Instructions: AdKimballnfusion Coordinator (RN) to assist per patient IV care needs in the home PRN.      Discharge Diagnosis:  PERIPHERAL VASCULAR DISEASE WITH CLAUDICATION  Secondary Diagnosis: Patient Active Problem List   Diagnosis Date Noted   Infected prosthetic vascular graft (HCW[place]07/27/2020   History of bilateral hip replacements 07/03/2017   Bilateral hip pain 07/03/2017   Lumbar herniated disc 03/23/2016   Spinal stenosis, lumbar region, with neurogenic claudication 05/05/2014     Class: Chronic   HNP (herniated nucleus pulposus), lumbar 05/05/2014    Class: Chronic   PVD (peripheral vascular disease) ([place]10/21/2015   Aortoiliac occlusive disease (HC[place]09/28/2015   Atherosclerosis of native arteries of the extremities with intermittent claudication 09/05/2013   Peripheral vascular disease, unspecified (HC[place]ok[place]08/20/2015   Avascular necrosis of hip ([place]04/08/2012   Past Medical History:  Diagnosis Date   Arthritis    Bipolar affective (HCC)    Chronic back pain    and right hip   Depression    GERD (gastroesophageal reflux disease)    occ tums   Headache(784.0)    migraines  Hip pain    Occlusion of artery    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   oxyCODONE-acetaminophen 5-325 MG tablet Commonly known as: PERCOCET/ROXICET Take 1 tablet by mouth 3 (three) times daily as needed for severe pain.   rosuvastatin 40 MG tablet Commonly known as: CRESTOR Take 1 tablet (40 mg total) by mouth daily.   vancomycin  IVPB Inject 1,500 mg into the vein daily. Indication:  Graft infection Last Day of Therapy:   09/21/18 Labs - _0 /01/20 1223          Discharge Instructions: [facility] Discharge instructions Lower Extremity Bypass Surgery  Please refer to the following instruction for your post-procedure care. Your surgeon or physician assistant will discuss any changes with you.  Activity  You are encouraged to walk as much as you can. You can slowly return to normal activities during the month after your surgery. Avoid strenuous activity and heavy lifting until your doctor tells you it's OK. Avoid activities such as vacuuming or swinging a golf club. Do not drive until your doctor give the OK and you are no longer taking prescription pain medications. It is also normal to have difficulty with sleep habits, eating and bowel movement after surgery. These will go away with time.  Bathing/Showering  You may shower after you go home. Do not soak in a bathtub, hot tub, or swim until the  incision heals completely.  Incision  Care  Clean your incision with mild soap and water. Shower every day. Pat the area dry with a clean towel. You do not need a bandage unless otherwise instructed. Do not apply any ointments or creams to your incision. If you have open wounds you will be instructed how to care for them or a visiting nurse may be arranged for you. If you have staples or sutures along your incision they will be removed at your post-op appointment. You may have skin glue on your incision. Do not peel it off. It will come off on its own in about one week.  Wash the groin wound with soap and water daily and pat dry. (No tub bath-only shower)  Then put a dry gauze or washcloth in the groin to keep this area dry to help prevent wound infection.  Do this daily and as needed.  Do not use Vaseline or neosporin on your incisions.  Only use soap and water on your incisions and then protect and keep dry.  Diet  Resume your normal diet. There are no special food restrictions following this procedure. A low fat/ low cholesterol diet is recommended for all patients with vascular disease. In order to heal from your surgery, it is CRITICAL to get adequate nutrition. Your body requires vitamins, minerals, and protein. Vegetables are the best source of vitamins and minerals. Vegetables also provide the perfect balance of protein. Processed food has little nutritional value, so try to avoid this.  Medications  Resume taking all your medications unless your doctor or Physician Assistant tells you not to. If your incision is causing pain, you may take over-the-counter pain relievers such as acetaminophen (Tylenol). If you were prescribed a stronger pain medication, please aware these medication can cause nausea and constipation. Prevent nausea by taking the medication with a snack or meal. Avoid constipation by drinking plenty of fluids and eating foods with high amount of fiber, such as fruits, vegetables, and grains. Take Colace 100 mg (an  over-the-counter stool softener) twice a day as needed for constipation.  Do not take Tylenol if you are taking prescription pain medications.  Follow Up  Our office will schedule a follow up appointment 2-3 weeks following discharge.  Please call us immediately for any of the following conditions  Severe or worsening pain in your legs or feet while at rest or while walking Increase pain, redness, warmth, or drainage (pus) from your incision site(s)  Fever of 101 degree or higher  The swelling in your leg with the bypass suddenly worsens and becomes more painful than when you were in the hospital  If you have been instructed to feel your graft pulse then you should do so every day. If you can no longer feel this pulse, call the office immediately. Not all patients are given this instruction.   Leg swelling is common after leg bypass surgery.  The swelling should improve over a few months following surgery. To improve the swelling, you may elevate your legs above the level of your heart while you are sitting or resting. Your surgeon or physician assistant may ask you to apply an ACE wrap or wear compression (TED) stockings to help to reduce swelling.  Reduce your risk of vascular disease  Stop smoking. If you would like help call [place] at 1-800-QUIT-NOW 231-548-9548) or [place] at [phone].   Manage your cholesterol  Maintain a desired weight  Control your diabetes weight  Control your diabetes  Keep your blood pressure down   If you have any questions, please call the office at [phone]   Prescriptions given: 1.  Vancomycin IV x 6 weeks 2.  Aspirin 57m daily OTC  [facility]  Patient's condition: is Good  Follow up: 1. Dr. FOneida Alarin 2 weeks   SLeontine Locket PA-C [facility] [phone]/02/2018  8:03 AM  - For VQI Registry use ---   Post-op:  Wound infection: No  Graft infection:  No  Transfusion: No    If yes, n/a units given New Arrhythmia: No Ipsilateral amputation: No, _0  Minor, _1  BKA, _2  AKA Discharge patency: [x ] Primary, _3  Primary assisted, _4  Secondary, _5  Occluded Patency judged by: [x ] Dopper only, [x ] Palpable graft pulse, _6  Palpable distal pulse, _7  ABI inc. > 0.15, _8  Duplex Discharge ABI: R not done after fem fem, L  D/C Ambulatory Status: Ambulatory  Complications: MI: No, <<[id number]>_6<\/[id number]>_2 Troponin only, _10  EKG or Clinical CHF: No Resp failure:No, _11  Pneumonia, _12  Ventilator Chg in renal function: No, _13  Inc. Cr > 0.5, _14  Temp. Dialysis,  _15  Permanent dialysis Stroke: No, _16  Minor, _17  Major Return to OR: No  Reason for return to OR: _18  Bleeding, _19  Infection, _20  Thrombosis, _21  Revision  Discharge medications: Statin use:  yes ASA use:  yes Plavix use:  no Beta blocker use: no CCB use:  No ACEI use:   no ARB use:  no Coumadin use: no

## 2018-08-12 NOTE — Discharge Instructions (Signed)
 [facility] [place]  Discharge instructions  Lower Extremity Bypass Surgery  Please refer to the following instruction for your post-procedure care. Your surgeon or physician assistant will discuss any changes with you.  Activity  You are encouraged to walk as much as you can. You can slowly return to normal activities during the month after your surgery. Avoid strenuous activity and heavy lifting until your doctor tells you it's OK. Avoid activities such as vacuuming or swinging a golf club. Do not drive until your doctor give the OK and you are no longer taking prescription pain medications. It is also normal to have difficulty with sleep habits, eating and bowel movement after surgery. These will go away with time.  Bathing/Showering  Shower daily after you go home. Do not soak in a bathtub, hot tub, or swim until the incision heals completely.  Incision Care  Clean your incision with mild soap and water. Shower every day. Pat the area dry with a clean towel. You do not need a bandage unless otherwise instructed. Do not apply any ointments or creams to your incision. If you have open wounds you will be instructed how to care for them or a visiting nurse may be arranged for you. If you have staples or sutures along your incision they will be removed at your post-op appointment. You may have skin glue on your incision. Do not peel it off. It will come off on its own in about one week.  Wash the groin wound with soap and water daily and pat dry. (No tub bath-only shower)  Then put a dry gauze or washcloth in the groin to keep this area dry to help prevent wound infection.  Do this daily and as needed.  Do not use Vaseline or neosporin on your incisions.  Only use soap and water on your incisions and then protect and keep dry.  Diet  Resume your normal diet. There are no special food restrictions following this procedure. A low fat/ low cholesterol diet is  recommended for all patients with vascular disease. In order to heal from your surgery, it is CRITICAL to get adequate nutrition. Your body requires vitamins, minerals, and protein. Vegetables are the best source of vitamins and minerals. Vegetables also provide the perfect balance of protein. Processed food has little nutritional value, so try to avoid this.  Medications  Resume taking all your medications unless your doctor or physician assistant tells you not to. If your incision is causing pain, you may take over-the-counter pain relievers such as acetaminophen (Tylenol). If you were prescribed a stronger pain medication, please aware these medication can cause nausea and constipation. Prevent nausea by taking the medication with a snack or meal. Avoid constipation by drinking plenty of fluids and eating foods with high amount of fiber, such as fruits, vegetables, and grains. Take Colace 100 mg (an over-the-counter stool softener) twice a day as needed for constipation.  Do not take Tylenol if you are taking prescription pain medications.  Follow Up  Our office will schedule a follow up appointment 2-3 weeks following discharge.  Please call us immediately for any of the following conditions  Severe or worsening pain in your legs or feet while at rest or while walking Increase pain, redness, warmth, or drainage (pus) from your incision site(s) Fever of 101 degree or higher The swelling in your leg with the bypass suddenly worsens and becomes more painful than when you were in the hospital If you have   been instructed to feel your graft pulse then you should do so every day. If you can no longer feel this pulse, call the office immediately. Not all patients are given this instruction.  Leg swelling is common after leg bypass surgery.  The swelling should improve over a few months following surgery. To improve the swelling, you may elevate your legs above the level of your heart while you are  sitting or resting. Your surgeon or physician assistant may ask you to apply an ACE wrap or wear compression (TED) stockings to help to reduce swelling.  Reduce your risk of vascular disease  Stop smoking. If you would like help call [facility]uit[facility] at 1-800-QUIT-NOW (1-800-784-8669) or [place] at [phone].  Manage your cholesterol Maintain a desired weight Control your diabetes weight Control your diabetes Keep your blood pressure down  If you have any questions, please call the office at [phone]  

## 2018-08-12 NOTE — Progress Notes (Addendum)
  Progress Note    08/12/2018 7:51 AM 2 Days Post-Op  Subjective:  Says the soreness is getting better every day  Afebrile HR 32'P-49'I NSR 264'B systolic 58% RA  Vitals:   08/12/18 0442 08/12/18 0443  BP: 122/81   Pulse: 76   Resp: 14   Temp:  97.9 F (36.6 C)  SpO2: 95%     Physical Exam: Cardiac:  regular Lungs:  Non labored Incisions:  Bilateral groins are clean and dry Extremities:  Good doppler signals bilateral feet; easily palpable graft pulse   CBC    Component Value Date/Time   WBC 11.2 (H) 08/11/2018 0301   RBC 4.22 08/11/2018 0301   HGB 13.9 08/11/2018 0301   HCT 41.2 08/11/2018 0301   PLT 214 08/11/2018 0301   MCV 97.6 08/11/2018 0301   MCH 32.9 08/11/2018 0301   MCHC 33.7 08/11/2018 0301   RDW 13.7 08/11/2018 0301   LYMPHSABS 2.6 04/09/2016 0129   MONOABS 0.8 04/09/2016 0129   EOSABS 0.1 04/09/2016 0129   BASOSABS 0.0 04/09/2016 0129    BMET    Component Value Date/Time   NA 139 08/11/2018 0301   K 5.4 (H) 08/11/2018 0301   CL 103 08/11/2018 0301   CO2 25 08/11/2018 0301   GLUCOSE 104 (H) 08/11/2018 0301   BUN 11 08/11/2018 0301   CREATININE 1.20 08/11/2018 0301   CALCIUM 10.0 08/11/2018 0301   GFRNONAA >60 08/11/2018 0301   GFRAA >60 08/11/2018 0301    INR    Component Value Date/Time   INR 1.0 08/10/2018 0321     Intake/Output Summary (Last 24 hours) at 08/12/2018 0751 Last data filed at 08/12/2018 0451 Gross per 24 hour  Intake -  Output 1900 ml  Net -1900 ml     Assessment:  58 y.o. male is s/p:  Right left femoral-femoral bypass  2 Days Post-Op  Plan: -pt continues to have good doppler signals bilateral feet and graft remains easily palpable.  -pharmacy has arranged IV vanc and labs and I signed these orders this am.  Appears that CM has [place] and equipment set up for pt.  RN will verify and if everything is arranged, will dc home today. -continue to mobilize -discussed groin wound care with pt and for him to monitor  his graft pulse.     Leontine Locket, PA-C [facility] [phone] 08/12/2018 7:51 AM  I have examined the patient, reviewed and agree with above.  Curt Jews, MD 08/12/2018 8:52 AM

## 2018-08-12 NOTE — TOC Transition Note (Signed)
Transition of Care [facility]) - CM/SW Discharge Note   Patient Details  Name: James Stevens MRN: [id number] Date of Birth: Nov 23, 1960  Transition of Care [facility]) CM/SW Contact:  Claudie Leach, RN Phone Number: 08/12/2018, 1:04 PM   Clinical Narrative:    Patient will receive afternoon dose of Vancomycin early in hospital this afternoon and then dc to home.  Pam , infusion nurse for [facility], met with patient this am and will have medication delivered and [place] RN to visit patient tomorrow afternoon 3-5pm.    Final next level of care: Home w Home Health Services Barriers to Discharge: No Barriers Identified   Patient Goals and CMS Choice Patient states their goals for this hospitalization and ongoing recovery are:: to get well CMS Medicare.gov Compare Post Acute Care list provided to:: Patient Choice offered to / list presented to : Patient  Discharge Plan and Services     Post Acute Care Choice: Home Health          DME Arranged: 3-N-1 DME Agency: [employer] Date DME Agency Contacted: 08/11/18 Time DME Agency Contacted: 1215 Representative spoke with at DME Agency: [place]: RN [facility] Agency: [place]) Date [place]: 08/11/18 Time [place]: 1156 Representative spoke with at [place]: Carolynn Sayers

## 2018-08-12 NOTE — Progress Notes (Signed)
Pt provided discharge instructions and education. PT vital stable. Pt denies any complaints. Pt has all belongings. CCMD notfied/telebox removed. Pt leaving with PICC for home antibiotics. Pt denies any questions. Pt tx via wheelchair to valet to meet ride.  James Stevens

## 2018-08-12 NOTE — Progress Notes (Signed)
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 PMH: arthritis, bipolar affective, depression, schizophrenia, lumbar sx, L total hip.    OT comments  Pt with improved pain. Ambulated in hall and performed standing grooming and toileting with supervision for lines and safety. Will rely on aide to assist with LB ADL. Recommending seated showering.  Follow Up Recommendations  No OT follow up;Supervision/Assistance - 24 hour    Equipment Recommendations  Tub/shower seat    Recommendations for Other Services      Precautions / Restrictions Precautions Precautions: Fall Restrictions Weight Bearing Restrictions: No       Mobility Bed Mobility Overal bed mobility: Modified Independent                Transfers Overall transfer level: Modified independent Equipment used: Rolling walker (2 wheeled)                  Balance             Standing balance-Leahy Scale: Fair Standing balance comment: statically                           ADL either performed or assessed with clinical judgement   ADL Overall ADL's : Needs assistance/impaired     Grooming: Wash/dry hands;Standing;Supervision/safety           Upper Body Dressing : Set up;Sitting       Toilet Transfer: Supervision/safety;Ambulation;RW;BSC   Toileting- Water quality scientist and Hygiene: Supervision/safety;Sit to/from Nurse, children's Details (indicate cue type and reason): pt needs a shower seat for safety Functional mobility during ADLs: Supervision/safety;Rolling walker General ADL Comments: aide will help with LB ADL     Vision       Perception     Praxis      Cognition Arousal/Alertness:  Awake/alert Behavior During Therapy: WFL for tasks assessed/performed Overall Cognitive Status: Within Functional Limits for tasks assessed                                          Exercises     Shoulder Instructions       General Comments      Pertinent Vitals/ Pain       Pain Assessment: Faces Faces Pain Scale: Hurts a little bit Pain Location: L LE  Pain Descriptors / Indicators: Operative site guarding;Sore Pain Intervention(s): Monitored during session  Home Living                                          Prior Functioning/Environment              Frequency  Min 2X/week        Progress Toward Goals  OT Goals(current goals can now be found in the care plan section)  Progress towards OT goals: Progressing toward goals  Acute Rehab OT Goals Patient Stated Goal: ready to go home OT Goal Formulation: With patient Time For Goal Achievement: 08/21/18 Potential to Achieve Goals: Good  Plan Discharge plan remains  appropriate    Co-evaluation                 AM-PAC OT "6 Clicks" Daily Activity     Outcome Measure   Help from another person eating meals?: None Help from another person taking care of personal grooming?: A Little Help from another person toileting, which includes using toliet, bedpan, or urinal?: A Little Help from another person bathing (including washing, rinsing, drying)?: A Little Help from another person to put on and taking off regular upper body clothing?: None Help from another person to put on and taking off regular lower body clothing?: A Little 6 Click Score: 20    End of Session Equipment Utilized During Treatment: Gait belt;Rolling walker  OT Visit Diagnosis: Other abnormalities of gait and mobility (R26.89);Pain   Activity Tolerance Patient tolerated treatment well   Patient Left in chair;with call bell/phone within reach   Nurse Communication Mobility status        Time:  [id number] OT Time Calculation (min): 27 min  Charges: OT General Charges $OT Visit: 1 Visit OT Treatments $Self Care/Home Management : 23-37 mins  Martie RoundJulie Timisha Mondry, OTR/L Acute Rehabilitation Services Pager: [phone] Office: [phone]   Evern BioMayberry, [name] 08/12/2018, 9:44 AM

## 2018-08-13 ENCOUNTER — Emergency Department (HOSPITAL_COMMUNITY): Payer: Medicaid Other

## 2018-08-13 ENCOUNTER — Encounter (HOSPITAL_COMMUNITY): Payer: Self-pay | Admitting: Radiology

## 2018-08-13 LAB — URINALYSIS, ROUTINE W REFLEX MICROSCOPIC
Bilirubin Urine: NEGATIVE
Glucose, UA: NEGATIVE mg/dL
Hgb urine dipstick: NEGATIVE
Ketones, ur: NEGATIVE mg/dL
Leukocytes,Ua: NEGATIVE
Nitrite: NEGATIVE
Protein, ur: NEGATIVE mg/dL
Specific Gravity, Urine: 1.02 (ref 1.005–1.030)
pH: 7 (ref 5.0–8.0)

## 2018-08-13 MED ORDER — FENTANYL CITRATE (PF) 100 MCG/2ML IJ SOLN: 50.0000 ug | INTRAMUSCULAR | Status: DC | PRN

## 2018-08-13 MED ORDER — IOHEXOL 350 MG/ML SOLN
100.0000 mL | Freq: Once | INTRAVENOUS | Status: AC | PRN
  Administered 2018-08-13: 100 mL via INTRAVENOUS

## 2018-08-13 MED ORDER — OXYCODONE-ACETAMINOPHEN 5-325 MG PO TABS
1.0000 | ORAL_TABLET | Freq: Once | ORAL | Status: AC
  Administered 2018-08-13: 1 via ORAL
  Filled 2018-08-13: qty 1

## 2018-08-13 MED ORDER — FENTANYL CITRATE (PF) 100 MCG/2ML IJ SOLN
50.0000 ug | INTRAMUSCULAR | Status: DC | PRN
  Administered 2018-08-13: 50 ug via INTRAVENOUS
  Filled 2018-08-13: qty 2

## 2018-08-13 MED ORDER — SODIUM CHLORIDE 0.9 % IV BOLUS
1000.0000 mL | Freq: Once | INTRAVENOUS | Status: AC
  Administered 2018-08-13: 02:00:00 1000 mL via INTRAVENOUS

## 2018-08-13 MED ORDER — FENTANYL CITRATE (PF) 100 MCG/2ML IJ SOLN: 75.0000 ug | Freq: Once | INTRAMUSCULAR | Status: DC

## 2018-08-13 NOTE — ED Notes (Signed)
Urine culture sent to lab with urine 

## 2018-08-13 NOTE — Discharge Instructions (Addendum)
Thank you for allowing me to care for you today in the Emergency Department.   Make sure that you are drinking plenty of fluids as you appear dehydrated today, which is likely why you are feeling dizzy and lightheaded.  The repeat scan from your recent surgery was negative.  Continue to take your home medications as prescribed.  Keep your follow-up appointment with vascular surgery.  Return to the emergency department if you develop new numbness, weakness, if you pass out, if your fingers or lips turn blue, if you develop respiratory distress, or other new, concerning symptoms.

## 2018-08-13 NOTE — ED Notes (Signed)
Called cab for pt's departure.

## 2018-08-13 NOTE — ED Notes (Signed)
Tried orthostatic vitals. Pt was able to sit up on the side of the bed but stated he hurt and did not think he could stand up.

## 2018-08-13 NOTE — ED Notes (Signed)
Patient transported to X-ray 

## 2018-08-13 NOTE — ED Notes (Signed)
Pt found standing independently at the side of the bed using the urinal. Pt was able to ambulate with assistance. Pt states using a front wheel walker at home. Pt able to walk independently with walker. Pt denies dizziness.

## 2018-08-13 NOTE — ED Notes (Signed)
Orthostatic vitals is explained to pt he states he does not feel like doing them at this moment but in about 30 min he will feel like doing the orthostatic vitals

## 2018-08-16 ENCOUNTER — Telehealth: Payer: Self-pay

## 2018-08-16 ENCOUNTER — Other Ambulatory Visit (HOSPITAL_COMMUNITY)
Admission: RE | Admit: 2018-08-16 | Discharge: 2018-08-16 | Disposition: A | Discharge status: Home / Self Care | Payer: Medicaid Other | Source: Other Acute Inpatient Hospital | Attending: Vascular Surgery | Admitting: Vascular Surgery

## 2018-08-16 DIAGNOSIS — T827XXD Infection and inflammatory reaction due to other cardiac and vascular devices, implants and grafts, subsequent encounter: Secondary | ICD-10-CM | POA: Insufficient documentation

## 2018-08-16 LAB — BASIC METABOLIC PANEL
Anion gap: 9 (ref 5–15)
BUN: 17 mg/dL (ref 6–20)
CO2: 24 mmol/L (ref 22–32)
Calcium: 9.4 mg/dL (ref 8.9–10.3)
Chloride: 108 mmol/L (ref 98–111)
Creatinine, Ser: 1.36 mg/dL — ABNORMAL HIGH (ref 0.61–1.24)
GFR calc Af Amer: >60 mL/min (ref >60)
GFR calc non Af Amer: 57 mL/min — ABNORMAL LOW (ref >60)
Glucose, Bld: 85 mg/dL (ref 70–99)
Potassium: 4.5 mmol/L (ref 3.5–5.1)
Sodium: 141 mmol/L (ref 135–145)

## 2018-08-16 LAB — VANCOMYCIN, TROUGH: Vancomycin Tr: 7 ug/mL — ABNORMAL LOW (ref 15–20)

## 2018-08-16 NOTE — Telephone Encounter (Signed)
Pt called office and requested a prescription for pain medication.   Left message for pt to call office Wednesday afternoon.   Called back this morning and left message for pt to try taking OTC extra strength tylenol for pain every 4-6 hours and to call back if he had no improvement to be evaluated.   York Cerise, CMA

## 2018-08-20 ENCOUNTER — Other Ambulatory Visit (HOSPITAL_COMMUNITY)
Admission: RE | Admit: 2018-08-20 | Discharge: 2018-08-20 | Disposition: A | Discharge status: Home / Self Care | Payer: Medicaid Other | Source: Other Acute Inpatient Hospital | Attending: Vascular Surgery | Admitting: Vascular Surgery

## 2018-08-20 DIAGNOSIS — T827XXD Infection and inflammatory reaction due to other cardiac and vascular devices, implants and grafts, subsequent encounter: Secondary | ICD-10-CM | POA: Diagnosis not present

## 2018-08-20 LAB — BASIC METABOLIC PANEL
Anion gap: 9 (ref 5–15)
BUN: 12 mg/dL (ref 6–20)
CO2: 25 mmol/L (ref 22–32)
Calcium: 9.4 mg/dL (ref 8.9–10.3)
Chloride: 107 mmol/L (ref 98–111)
Creatinine, Ser: 1.52 mg/dL — ABNORMAL HIGH (ref 0.61–1.24)
GFR calc Af Amer: 58 mL/min — ABNORMAL LOW (ref >60)
GFR calc non Af Amer: 50 mL/min — ABNORMAL LOW (ref >60)
Glucose, Bld: 131 mg/dL — ABNORMAL HIGH (ref 70–99)
Potassium: 4.3 mmol/L (ref 3.5–5.1)
Sodium: 141 mmol/L (ref 135–145)

## 2018-08-20 LAB — VANCOMYCIN, TROUGH: Vancomycin Tr: 8 ug/mL — ABNORMAL LOW (ref 15–20)

## 2018-08-23 ENCOUNTER — Ambulatory Visit: Payer: Medicaid Other | Admitting: Vascular Surgery

## 2018-08-29 ENCOUNTER — Telehealth (HOSPITAL_COMMUNITY): Payer: Self-pay | Admitting: Rehabilitation

## 2018-08-29 ENCOUNTER — Telehealth: Payer: Self-pay | Admitting: *Deleted

## 2018-08-29 NOTE — Telephone Encounter (Signed)
Patient called c/o "pain and numbness bilateral inner thigh to knee area". Denies any drainage, heat or swelling. No fever or chills according to patient. Claims toes are warm, but feels like "a band on my leg". Desires to be checked. States he can not get here today. Appt to see PA 08/30/2018. Instructed to report to the ER at [facility] for any acute or worsening condition.

## 2018-08-29 NOTE — Telephone Encounter (Signed)

## 2018-08-30 ENCOUNTER — Other Ambulatory Visit: Payer: Self-pay

## 2018-08-30 ENCOUNTER — Ambulatory Visit (INDEPENDENT_AMBULATORY_CARE_PROVIDER_SITE_OTHER): Payer: Self-pay | Admitting: Physician Assistant

## 2018-08-30 VITALS — BP 117/85 | HR 85 | Temp 98.1°F | Resp 18 | Ht 69.0 in

## 2018-08-30 DIAGNOSIS — T827XXA Infection and inflammatory reaction due to other cardiac and vascular devices, implants and grafts, initial encounter: Secondary | ICD-10-CM

## 2018-08-30 DIAGNOSIS — I7409 Other arterial embolism and thrombosis of abdominal aorta: Secondary | ICD-10-CM

## 2018-08-30 MED ORDER — GABAPENTIN 300 MG PO CAPS: 300.0000 mg | ORAL_CAPSULE | Freq: Every day | ORAL | 1 refills | Status: DC | PRN

## 2018-08-30 NOTE — Progress Notes (Signed)
    Postoperative Visit   History of Present Illness   James Stevens is a 58 y.o. year old male who presents for postoperative follow-up for right to left femoral to femoral bypass.  Patient also underwent excision of a segment of the left limb aortobifemoral bypass graft, left femoral endarterectomy, and vein patch of femoral artery by Dr. Oneida Alar on 08/06/2018.  This was done due to purulent collection adjacent to the limb.  He then underwent right to left femoral to femoral bypass on 08/10/2018 by Dr. Oneida Alar, and was discharged with a PICC line for 6 weeks of vancomycin.  Patient denies any rest pain or tissue loss bilateral lower extremities.  He also denies any fevers, chills, nausea/vomiting or drainage or swelling from groin incisions.  His main complaint is of numbness noted on the inner thighs bilaterally with pain to light touch.  He is taking an aspirin daily.   For VQI Use Only   PRE-ADM LIVING: Home  AMB STATUS: Ambulatory with Assistance   Physical Examination   Vitals:   08/30/18 1445  BP: 117/85  Pulse: 85  Resp: 18  Temp: 98.1 F (36.7 C)  TempSrc: Temporal  SpO2: 98%  Height: 5\' 9"  (1.753 m)    BLE: Groin incisions are healing well; easily palpable femoral pulses as well as femoral to femoral bypass pulse; no obvious areas of fluctuance, and no drainage from incisions.   Medical Decision Making   James Stevens is a 58 y.o. year old male who presents s/p excision of left limb of aortobifemoral bypass with subsequent right to left femoral to femoral bypass and 6 weeks of vancomycin due to purulent pocket noted in left groin adjacent to graft limb.  . Bilateral lower extremities are well perfused with a patent femoral to femoral bypass as evidenced by easily palpable graft pulse . No obvious signs of infection systemically or related to areas of incision or tunneling . Continue vancomycin infusion . Patient has an appointment next week with Dr. Oneida Alar for  PICC line removal and incision checks; I will also try to schedule his initial graft surveillance and ABIs at that time . I will prescribe a low-dose of gabapentin for complaint of excessive pain to light touch on inner thighs, dose can be titrated up after vancomycin course is completed if necessary   Dagoberto Ligas PA-C [facility] [place] [facility] Office: [phone]  Clinic MD: Dr. Oneida Alar

## 2018-08-31 ENCOUNTER — Other Ambulatory Visit: Payer: Self-pay | Admitting: Vascular Surgery

## 2018-08-31 DIAGNOSIS — I7409 Other arterial embolism and thrombosis of abdominal aorta: Secondary | ICD-10-CM

## 2018-08-31 DIAGNOSIS — T827XXA Infection and inflammatory reaction due to other cardiac and vascular devices, implants and grafts, initial encounter: Secondary | ICD-10-CM

## 2018-08-31 DIAGNOSIS — I739 Peripheral vascular disease, unspecified: Secondary | ICD-10-CM

## 2018-08-31 DIAGNOSIS — Z95828 Presence of other vascular implants and grafts: Secondary | ICD-10-CM

## 2018-09-05 ENCOUNTER — Telehealth (HOSPITAL_COMMUNITY): Payer: Self-pay

## 2018-09-05 NOTE — Telephone Encounter (Signed)

## 2018-09-06 ENCOUNTER — Ambulatory Visit (INDEPENDENT_AMBULATORY_CARE_PROVIDER_SITE_OTHER)
Admission: RE | Admit: 2018-09-06 | Discharge: 2018-09-06 | Disposition: A | Discharge status: Home / Self Care | Payer: Medicaid Other | Source: Ambulatory Visit | Attending: Vascular Surgery | Admitting: Vascular Surgery

## 2018-09-06 ENCOUNTER — Ambulatory Visit (INDEPENDENT_AMBULATORY_CARE_PROVIDER_SITE_OTHER): Payer: Self-pay | Admitting: Vascular Surgery

## 2018-09-06 ENCOUNTER — Other Ambulatory Visit: Payer: Self-pay

## 2018-09-06 ENCOUNTER — Ambulatory Visit: Payer: Medicaid Other | Admitting: Vascular Surgery

## 2018-09-06 ENCOUNTER — Ambulatory Visit (HOSPITAL_COMMUNITY)
Admission: RE | Admit: 2018-09-06 | Discharge: 2018-09-06 | Disposition: A | Discharge status: Home / Self Care | Payer: Medicaid Other | Source: Ambulatory Visit | Attending: Vascular Surgery | Admitting: Vascular Surgery

## 2018-09-06 ENCOUNTER — Encounter: Payer: Self-pay | Admitting: Vascular Surgery

## 2018-09-06 VITALS — BP 94/66 | HR 85 | Temp 97.8°F | Resp 20 | Ht 69.0 in | Wt 138.1 lb

## 2018-09-06 DIAGNOSIS — I739 Peripheral vascular disease, unspecified: Secondary | ICD-10-CM | POA: Diagnosis not present

## 2018-09-06 DIAGNOSIS — Z95828 Presence of other vascular implants and grafts: Secondary | ICD-10-CM

## 2018-09-06 DIAGNOSIS — I7409 Other arterial embolism and thrombosis of abdominal aorta: Secondary | ICD-10-CM

## 2018-09-06 DIAGNOSIS — T827XXA Infection and inflammatory reaction due to other cardiac and vascular devices, implants and grafts, initial encounter: Secondary | ICD-10-CM | POA: Insufficient documentation

## 2018-09-06 NOTE — Progress Notes (Signed)
Patient is a 58 year old male who returns for follow-up today.  He underwent aortobifemoral bypass grafting several years ago.  Recently he was noted to have occlusion of the left limb of the graft.  He had a femoral femoral bypass from the right to the left on August 10, 2018.  He reports no incisional drainage.  He still has some intermittent groin incisional pain but this is improved.  He has no claudication symptoms in his left lower extremity at this point.  He is currently receiving antibiotics via a PICC line.  This is secondary to the fact that we found what appeared to be a purulent collection around the left limb of his aortobifemoral bypass graft.  He has had no signs or symptoms of infection.  Antibiotic course was scheduled for 6 weeks which will end 2 weeks from now.  Physical exam:  Vitals:   09/06/18 1117  BP: 94/66  Pulse: 85  Resp: 20  Temp: 97.8 F (36.6 C)  SpO2: 97%  Weight: 138 lb 1.6 oz (62.6 kg)  Height: 5\' 9"  (1.753 m)    Extremities: 2+ femoral pulses bilaterally easily palpable femorofemoral graft pulse both groin incisions well-healed no erythema no discharge  No palpable pedal pulses bilaterally  Data: Patient had a duplex ultrasound of his femorofemoral bypass today which showed no evidence of stenosis.  He had bilateral ABIs which on the right was 0.7 which was at his baseline left had improved from 0.7 to 0.95.  I reviewed and interpreted the studies.  Assessment: Doing well status post right left femoral-femoral bypass and prior aorto femoral bypass with no claudication symptoms currently.  No evidence currently of left groin infection.  Plan: Patient will continue his antibiotics for 2 more weeks.  He will be seen in our APP clinic in 2 weeks for removal of his PICC line if he has no further symptoms.  Ruta Hinds, MD [facility] [place] Office: [phone] Pager: [phone]

## 2018-09-20 ENCOUNTER — Ambulatory Visit: Payer: Medicaid Other | Admitting: Vascular Surgery

## 2018-09-24 ENCOUNTER — Telehealth: Payer: Self-pay

## 2018-09-24 ENCOUNTER — Other Ambulatory Visit: Payer: Self-pay

## 2018-09-24 ENCOUNTER — Ambulatory Visit (INDEPENDENT_AMBULATORY_CARE_PROVIDER_SITE_OTHER): Payer: Self-pay | Admitting: Physician Assistant

## 2018-09-24 VITALS — BP 119/80 | HR 85 | Resp 20 | Ht 69.0 in | Wt 138.0 lb

## 2018-09-24 DIAGNOSIS — I739 Peripheral vascular disease, unspecified: Secondary | ICD-10-CM

## 2018-09-24 DIAGNOSIS — T827XXA Infection and inflammatory reaction due to other cardiac and vascular devices, implants and grafts, initial encounter: Secondary | ICD-10-CM

## 2018-09-24 NOTE — Telephone Encounter (Signed)
Pt called because he missed his appt last week to see Dr Oneida Alar and have his PICC line removed. Appt made for PA to take it out in the office this afternoon. Pt did try to reach out to home health for removal but they said that they had already discharged him from their service.   York Cerise, CMA

## 2018-09-24 NOTE — Progress Notes (Signed)
  POST OPERATIVE OFFICE NOTE    CC:  F/u for surgery  HPI:  This is a 58 y.o. male who presents for postoperative follow-up for right to left femoral to femoral bypass.  Patient also underwent excision of a segment of the left limb aortobifemoral bypass graft, left femoral endarterectomy, and vein patch of femoral artery by Dr. Oneida Alar on 08/06/2018.  This was done due to purulent collection adjacent to the limb.  He then underwent right to left femoral to femoral bypass on 08/10/2018 by Dr. Oneida Alar, and was discharged with a PICC line for 6 weeks of vancomycin  He was supposed to see Dr. Oneida Alar last week to have PICC line removed, however, he missed his appt. He did reach out to [facility], however, they had already discharged him.  He is here today for PICC removal.    He states that he is doing well.  He has not had any fevers.  Says his groin incisions have healed nicely.  He states he can walk without having claudication.  He states he has an appt with Dr. Oneida Alar next month.   Allergies  Allergen Reactions  . Ibuprofen Other (See Comments)    Gastric irritation  . Lyrica [Pregabalin] Anxiety  . Tramadol Itching    Current Outpatient Medications  Medication Sig Dispense Refill  . gabapentin (NEURONTIN) 300 MG capsule Take 1 capsule (300 mg total) by mouth daily as needed. 30 capsule 1  . oxyCODONE-acetaminophen (PERCOCET/ROXICET) 5-325 MG tablet Take 1 tablet by mouth 3 (three) times daily as needed for severe pain.     . vancomycin IVPB Inject 1,500 mg into the vein daily. Indication:  Graft infection Last Day of Therapy:   09/21/18 Labs - Sunday/Monday:  CBC/D, BMP, and vancomycin trough. Labs - Thursday:  BMP and vancomycin trough Labs - Every other week:  ESR and CRP 30 Units 0   No current facility-administered medications for this visit.    Facility-Administered Medications Ordered in Other Visits  Medication Dose Route Frequency Provider Last Rate Last Dose  . lactated ringers  infusion    Continuous PRN Harden Mo, CRNA      . lactated ringers infusion    Continuous PRN Harden Mo, CRNA         ROS:  See HPI  Physical Exam:  Today's Vitals   09/24/18 1559  BP: 119/80  Pulse: 85  Resp: 20  SpO2: 100%  Weight: 138 lb (62.6 kg)  Height: _0  (1.753 m)   Body mass index is 20.38 kg/m.   Incision:  Bilateral groins healed nicely. Extremities:  Bilateral feet warm; PICC in place RUE   Assessment/Plan:  This is a 58 y.o. male who is here today to have his PICC removed.    -PICC removed by Dr. Trula Slade without difficulty and tip in tact. -pt has appt with Dr. Oneida Alar next month that he will keep.   Leontine Locket, PA-C [facility] [phone]  Clinic MD:  Pt seen and examined with Dr. Trula Slade

## 2018-10-25 ENCOUNTER — Ambulatory Visit: Payer: Medicaid Other | Admitting: Vascular Surgery

## 2018-12-01 ENCOUNTER — Inpatient Hospital Stay (HOSPITAL_COMMUNITY)
Admission: EM | Admit: 2018-12-01 | Discharge: 2018-12-04 | DRG: 193 | Disposition: A | Discharge status: Home / Self Care | Payer: Medicaid Other | Attending: Internal Medicine | Admitting: Internal Medicine

## 2018-12-01 ENCOUNTER — Other Ambulatory Visit: Payer: Self-pay

## 2018-12-01 ENCOUNTER — Encounter (HOSPITAL_COMMUNITY): Payer: Self-pay

## 2018-12-01 ENCOUNTER — Emergency Department (HOSPITAL_COMMUNITY): Payer: Medicaid Other

## 2018-12-01 DIAGNOSIS — F17210 Nicotine dependence, cigarettes, uncomplicated: Secondary | ICD-10-CM | POA: Diagnosis present

## 2018-12-01 DIAGNOSIS — Z96643 Presence of artificial hip joint, bilateral: Secondary | ICD-10-CM

## 2018-12-01 DIAGNOSIS — R0902 Hypoxemia: Secondary | ICD-10-CM

## 2018-12-01 DIAGNOSIS — J9601 Acute respiratory failure with hypoxia: Secondary | ICD-10-CM | POA: Diagnosis present

## 2018-12-01 DIAGNOSIS — Z720 Tobacco use: Secondary | ICD-10-CM | POA: Diagnosis present

## 2018-12-01 DIAGNOSIS — J189 Pneumonia, unspecified organism: Secondary | ICD-10-CM

## 2018-12-01 DIAGNOSIS — J129 Viral pneumonia, unspecified: Principal | ICD-10-CM | POA: Diagnosis present

## 2018-12-01 DIAGNOSIS — I700 Atherosclerosis of aorta: Secondary | ICD-10-CM | POA: Diagnosis present

## 2018-12-01 DIAGNOSIS — Z833 Family history of diabetes mellitus: Secondary | ICD-10-CM

## 2018-12-01 DIAGNOSIS — I739 Peripheral vascular disease, unspecified: Secondary | ICD-10-CM | POA: Diagnosis present

## 2018-12-01 DIAGNOSIS — R0602 Shortness of breath: Secondary | ICD-10-CM

## 2018-12-01 DIAGNOSIS — J188 Other pneumonia, unspecified organism: Secondary | ICD-10-CM | POA: Diagnosis present

## 2018-12-01 DIAGNOSIS — Z20828 Contact with and (suspected) exposure to other viral communicable diseases: Secondary | ICD-10-CM | POA: Diagnosis present

## 2018-12-01 HISTORY — DX: Tobacco use: Z72.0

## 2018-12-01 HISTORY — DX: Pneumonia, unspecified organism: J18.9

## 2018-12-01 LAB — CBC
HCT: 49 % (ref 39.0–52.0)
Hemoglobin: 16.5 g/dL (ref 13.0–17.0)
MCH: 32.7 pg (ref 26.0–34.0)
MCHC: 33.7 g/dL (ref 30.0–36.0)
MCV: 97 fL (ref 80.0–100.0)
Platelets: 199 10*3/uL (ref 150–400)
RBC: 5.05 MIL/uL (ref 4.22–5.81)
RDW: 15 % (ref 11.5–15.5)
WBC: 10 10*3/uL (ref 4.0–10.5)
nRBC: 0 % (ref 0.0–0.2)

## 2018-12-01 LAB — BASIC METABOLIC PANEL
Anion gap: 10 (ref 5–15)
BUN: 10 mg/dL (ref 6–20)
CO2: 24 mmol/L (ref 22–32)
Calcium: 9.5 mg/dL (ref 8.9–10.3)
Chloride: 102 mmol/L (ref 98–111)
Creatinine, Ser: 1.34 mg/dL — ABNORMAL HIGH (ref 0.61–1.24)
GFR calc Af Amer: >60 mL/min (ref >60)
GFR calc non Af Amer: 58 mL/min — ABNORMAL LOW (ref >60)
Glucose, Bld: 110 mg/dL — ABNORMAL HIGH (ref 70–99)
Potassium: 3.7 mmol/L (ref 3.5–5.1)
Sodium: 136 mmol/L (ref 135–145)

## 2018-12-01 LAB — TROPONIN I (HIGH SENSITIVITY): Troponin I (High Sensitivity): 3 ng/L (ref <18)

## 2018-12-01 MED ORDER — SODIUM CHLORIDE 0.9% FLUSH: 3.0000 mL | Freq: Once | INTRAVENOUS | Status: DC

## 2018-12-01 NOTE — ED Triage Notes (Signed)
Pt from home with ems c.o right sided ribcage pain that started yesterday, worse when he takes a deep breath and worse with palpation. Denies fall or trauma, no bruising noted. Pt a.o, nad noted   150/100 P 100

## 2018-12-02 ENCOUNTER — Emergency Department (HOSPITAL_COMMUNITY): Payer: Medicaid Other

## 2018-12-02 ENCOUNTER — Encounter (HOSPITAL_COMMUNITY): Payer: Self-pay | Admitting: Internal Medicine

## 2018-12-02 ENCOUNTER — Other Ambulatory Visit: Payer: Self-pay

## 2018-12-02 DIAGNOSIS — Z720 Tobacco use: Secondary | ICD-10-CM | POA: Diagnosis present

## 2018-12-02 DIAGNOSIS — J9601 Acute respiratory failure with hypoxia: Secondary | ICD-10-CM | POA: Diagnosis not present

## 2018-12-02 DIAGNOSIS — J189 Pneumonia, unspecified organism: Secondary | ICD-10-CM | POA: Diagnosis present

## 2018-12-02 DIAGNOSIS — I700 Atherosclerosis of aorta: Secondary | ICD-10-CM | POA: Diagnosis present

## 2018-12-02 LAB — RESPIRATORY PANEL BY PCR

## 2018-12-02 LAB — CBC
HCT: 51 % (ref 39.0–52.0)
Hemoglobin: 16.7 g/dL (ref 13.0–17.0)
MCH: 32.6 pg (ref 26.0–34.0)
MCHC: 32.7 g/dL (ref 30.0–36.0)
MCV: 99.4 fL (ref 80.0–100.0)
Platelets: 212 10*3/uL (ref 150–400)
RBC: 5.13 MIL/uL (ref 4.22–5.81)
RDW: 15.1 % (ref 11.5–15.5)
WBC: 10.9 10*3/uL — ABNORMAL HIGH (ref 4.0–10.5)
nRBC: 0 % (ref 0.0–0.2)

## 2018-12-02 LAB — SARS CORONAVIRUS 2 (TAT 6-24 HRS): SARS Coronavirus 2: NEGATIVE

## 2018-12-02 LAB — CREATININE, SERUM
Creatinine, Ser: 1.32 mg/dL — ABNORMAL HIGH (ref 0.61–1.24)
GFR calc Af Amer: >60 mL/min (ref >60)
GFR calc non Af Amer: 59 mL/min — ABNORMAL LOW (ref >60)

## 2018-12-02 LAB — TROPONIN I (HIGH SENSITIVITY): Troponin I (High Sensitivity): <2 ng/L (ref <18)

## 2018-12-02 LAB — HIV ANTIBODY (ROUTINE TESTING W REFLEX): HIV Screen 4th Generation wRfx: NONREACTIVE

## 2018-12-02 LAB — POC SARS CORONAVIRUS 2 AG -  ED: SARS Coronavirus 2 Ag: NEGATIVE

## 2018-12-02 MED ORDER — AZITHROMYCIN 250 MG PO TABS: 500.0000 mg | ORAL_TABLET | Freq: Every day | ORAL | Status: DC

## 2018-12-02 MED ORDER — SODIUM CHLORIDE 0.9 % IV SOLN: 2.0000 g | INTRAVENOUS | Status: DC

## 2018-12-02 MED ORDER — AZITHROMYCIN 500 MG PO TABS
500.0000 mg | ORAL_TABLET | Freq: Every day | ORAL | Status: DC
  Administered 2018-12-03 – 2018-12-04 (×2): 500 mg via ORAL
  Filled 2018-12-02: qty 2
  Filled 2018-12-02 (×2): qty 1

## 2018-12-02 MED ORDER — SODIUM CHLORIDE 0.9 % IV SOLN
2.0000 g | INTRAVENOUS | Status: DC
  Administered 2018-12-03: 2 g via INTRAVENOUS
  Filled 2018-12-02: qty 2
  Filled 2018-12-02: qty 20

## 2018-12-02 MED ORDER — OXYCODONE HCL 5 MG PO TABS
2.5000 mg | ORAL_TABLET | ORAL | Status: DC | PRN
  Administered 2018-12-02 – 2018-12-04 (×8): 5 mg via ORAL
  Filled 2018-12-02 (×8): qty 1

## 2018-12-02 MED ORDER — OXYCODONE-ACETAMINOPHEN 10-325 MG PO TABS: 0.5000 | ORAL_TABLET | ORAL | Status: DC | PRN

## 2018-12-02 MED ORDER — IOHEXOL 350 MG/ML SOLN
100.0000 mL | Freq: Once | INTRAVENOUS | Status: AC | PRN
  Administered 2018-12-02: 71 mL via INTRAVENOUS

## 2018-12-02 MED ORDER — ENOXAPARIN SODIUM 40 MG/0.4ML ~~LOC~~ SOLN
40.0000 mg | SUBCUTANEOUS | Status: DC
  Administered 2018-12-03: 40 mg via SUBCUTANEOUS
  Filled 2018-12-02: qty 0.4

## 2018-12-02 MED ORDER — OXYCODONE-ACETAMINOPHEN 5-325 MG PO TABS
0.5000 | ORAL_TABLET | ORAL | Status: DC | PRN
  Administered 2018-12-02 – 2018-12-04 (×7): 1 via ORAL
  Filled 2018-12-02 (×7): qty 1

## 2018-12-02 MED ORDER — SODIUM CHLORIDE 0.9 % IV SOLN: 1.0000 g | Freq: Once | INTRAVENOUS | Status: DC

## 2018-12-02 MED ORDER — POTASSIUM CHLORIDE IN NACL 20-0.9 MEQ/L-% IV SOLN
INTRAVENOUS | Status: AC
  Administered 2018-12-02 – 2018-12-03 (×3): via INTRAVENOUS
  Filled 2018-12-02 (×3): qty 1000

## 2018-12-02 MED ORDER — FENTANYL CITRATE (PF) 100 MCG/2ML IJ SOLN
50.0000 ug | Freq: Once | INTRAMUSCULAR | Status: AC
  Administered 2018-12-02: 07:00:00 50 ug via INTRAVENOUS
  Filled 2018-12-02: qty 2

## 2018-12-02 MED ORDER — SODIUM CHLORIDE 0.9 % IV SOLN
500.0000 mg | Freq: Once | INTRAVENOUS | Status: AC
  Administered 2018-12-02: 500 mg via INTRAVENOUS
  Filled 2018-12-02: qty 500

## 2018-12-02 MED ORDER — FENTANYL CITRATE (PF) 100 MCG/2ML IJ SOLN
50.0000 ug | Freq: Once | INTRAMUSCULAR | Status: AC
  Administered 2018-12-02: 50 ug via INTRAVENOUS
  Filled 2018-12-02: qty 2

## 2018-12-02 NOTE — ED Notes (Signed)
CT notified pt just given pain meds for scan

## 2018-12-02 NOTE — H&P (Signed)
History and Physical    James Stevens:[id number] DOB: Oct 26, 1960 DOA: 12/01/2018  PCP: James Contras, MD  Patient coming from: Home via EMS  I have personally briefly reviewed patient's old medical records in [facility]  Chief Complaint: Right-sided rib cage pain that started yesterday worse when he takes a deep breath and worse with palpation.  No trauma associated with it.  HPI: James Stevens is a 58 y.o. male with medical history significant of arthritis, bipolar disease, chronic back pain, gastroesophageal reflux disease, migraine headaches, severe peripheral vascular disease with aortic atherosclerosis and schizophrenia who presents emergency department with chest pain on the right lower rib area.  He has pain with palpation has been there for about a day.  He thought it came on because he laid on a remote control but it seems worse than would make sense to him.  He also complains of shortness of breath.  He has history of clots in his legs but no other traumas.  (I believe the patient is referring to arterial stenoses not deep venous thromboses).  Rest did not make it better and taking a deep breath makes it worse.  The pain is of moderate severity.  It is constant and worse when he takes deep breath.  ED Course: Initial rib films unremarkable but patient mildly hypoxemic on room air and therefore CT scan of chest was obtained.  CTA did not show any pulmonary embolism but did show some right-sided pneumonia.  She is ambulated on room air and his oxygen saturations dropped into the mid to low 80s and he became extremely short of breath.  Patient does not feel safe going home and he is referred to me for further evaluation and management.  Review of Systems: As per HPI otherwise all other systems reviewed and  negative.   Past Medical History:  Diagnosis Date  . Arthritis   . Bipolar affective (HCC)   . Chronic back pain    and right hip  . Depression   . GERD  (gastroesophageal reflux disease)    occ tums  . Headache(784.0)    migraines  . Hip pain   . 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 Peripheral vascular disease (HCC)   . Schizophrenia [facility])     Past Surgical History:  Procedure Laterality Date  . ABDOMINAL AORTOGRAM W/LOWER EXTREMITY Bilateral 06/29/2018   Procedure: ABDOMINAL AORTOGRAM W/LOWER EXTREMITY;  Surgeon: Sherren Kerns, MD;  Location: [facility] INVASIVE CV LAB;  Service: Cardiovascular;  Laterality: Bilateral;  . AORTA - BILATERAL FEMORAL ARTERY BYPASS GRAFT N/A 10/07/2013   Procedure: AORTA BIFEMORAL BYPASS GRAFT;  Surgeon: Sherren Kerns, MD;  Location: [facility] OR;  Service: Vascular;  Laterality: N/A;  . ENDARTERECTOMY FEMORAL Left 08/06/2018   Procedure: Left Common Femoral Endarterectomy;  Surgeon: Sherren Kerns, MD;  Location: [facility] OR;  Service: Vascular;  Laterality: Left;  . ESOPHAGEAL DILATION    . ESOPHAGOGASTRODUODENOSCOPY  09/06/2011   Procedure: ESOPHAGOGASTRODUODENOSCOPY (EGD);  Surgeon: Vertell Novak., MD;  Location: [facility];  Service: Endoscopy;  Laterality: N/A;  . EYE SURGERY    . FEMORAL-FEMORAL BYPASS GRAFT Left 08/06/2018   Procedure: Ligation and Removal of Left Limb of Aortobifemoral Bypass Graft;  Surgeon: Sherren Kerns, MD;  Location: [facility] OR;  Service: Vascular;  Laterality: Left;  . FEMORAL-FEMORAL BYPASS GRAFT Bilateral 08/10/2018   Procedure: RIGHT TO LEFT FEMORAL TO FEMORAL ARTERY BYPASS USING  83mm X 60cm HEMASHIELD GOLD GRAFT;  Surgeon: Sherren Kerns, MD;  Location: [facility] OR;  Service: Vascular;  Laterality: Bilateral;  . FRACTURE SURGERY     left fingers # 4,5  . JOINT REPLACEMENT Bilateral    rt hip replacement, and  LT hip  . LUMBAR LAMINECTOMY N/A 05/05/2014   Procedure: BILATERAL LUMBAR LAMINECTOMY L4-5 AND L5-S1;  Surgeon: James Champagne, MD;  Location: [facility] OR;  Service: Orthopedics;  Laterality: N/A;  . MANDIBLE FRACTURE  SURGERY    . PATCH ANGIOPLASTY Left 08/06/2018   Procedure: Patch Angioplasty Left Common Femoral Artery with Greater Saphenous Vein;  Surgeon: Sherren Kerns, MD;  Location: [facility] OR;  Service: Vascular;  Laterality: Left;  . SAVORY DILATION  09/06/2011   Procedure: SAVORY DILATION;  Surgeon: Vertell Novak., MD;  Location: [facility];  Service: Endoscopy;  Laterality: N/A;  . THROMBECTOMY FEMORAL ARTERY Bilateral 08/10/2018   Procedure: FEMORAL ARTERY THROMBECTOMY;  Surgeon: Sherren Kerns, MD;  Location: [facility] OR;  Service: Vascular;  Laterality: Bilateral;  . TOTAL HIP ARTHROPLASTY Left 04/17/2012   Procedure: LEFT TOTAL HIP ARTHROPLASTY ANTERIOR APPROACH;  Surgeon: James Hitch, MD;  Location: [facility] OR;  Service: Orthopedics;  Laterality: Left;    Social History   Social History Narrative  . Not on file     reports that he has been smoking cigarettes. He has a 8.50 pack-year smoking history. He has never used smokeless tobacco. He reports current alcohol use. He reports current drug use. Frequency: 2.00 times per week. Drugs: Cocaine and Marijuana.  Allergies  Allergen Reactions  . Ibuprofen Other (See Comments)    Gastric irritation  . Lyrica [Pregabalin] Anxiety  . Tramadol Itching    Family History  Problem Relation Age of Onset  . Diabetes Mother   . Diabetes Father   . Diabetes Other   . Diabetes Brother     Prior to Admission medications   Medication Sig Start Date End Date Taking? Authorizing Provider  oxyCODONE-acetaminophen (PERCOCET) 10-325 MG tablet Take 0.5-1 tablets by mouth See admin instructions. 2-3 times daily. 11/14/18  Yes [provider]  rosuvastatin (CRESTOR) 40 MG tablet Take 1 tablet (40 mg total) by mouth daily. Patient not taking: Reported on 07/31/2018 07/05/18 08/13/18  Lewayne Bunting, MD    Physical Exam:  Constitutional: NAD, calm, comfortable Vitals:   12/02/18 0639 12/02/18 0808 12/02/18 1021 12/02/18 1241  BP: (!)  145/95 130/75 (!) 153/88 110/65  Pulse: 80 75 (!) 53 (!) 120  Resp: (!) 24 (!) 27 (!) 28 (!) 28  Temp:      TempSrc:      SpO2: 96% 95% 99% 98%  Weight:      Height:       Eyes: PERRL, lids and conjunctivae normal ENMT: Mucous membranes are moist. Posterior pharynx clear of any exudate or lesions.Normal dentition.  Neck: normal, supple, no masses, no thyromegaly Respiratory: Coarse breath sounds bilaterally, no wheezing, no crackles.  Increased respiratory effort. No accessory muscle use.  Cardiovascular: Regular rate and rhythm, no murmurs / rubs / gallops. No extremity edema. 2+ pedal pulses. No carotid bruits.  Abdomen: no tenderness, no masses palpated. No hepatosplenomegaly. Bowel sounds positive.  Musculoskeletal: no clubbing / cyanosis. No joint deformity upper and lower extremities. Good ROM, no contractures. Normal muscle tone.  Skin: no rashes, lesions, ulcers. No induration Neurologic: CN 2-12 grossly intact. Sensation intact, DTR normal. Strength 5/5 in all 4.  Psychiatric: Normal judgment and  insight. Alert and oriented x 3. Normal mood.    Labs on Admission: I have personally reviewed following labs and imaging studies  CBC: Recent Labs  Lab 12/01/18 2005  WBC 10.0  HGB 16.5  HCT 49.0  MCV 97.0  PLT 366   Basic Metabolic Panel: Recent Labs  Lab 12/01/18 2005  NA 136  K 3.7  CL 102  CO2 24  GLUCOSE 110*  BUN 10  CREATININE 1.34*  CALCIUM 9.5   GFR: Estimated Creatinine Clearance: 55.2 mL/min (A) (by C-G formula based on SCr of 1.34 mg/dL (H)). Urine analysis:    Component Value Date/Time   COLORURINE STRAW (A) 08/13/2018 0325   APPEARANCEUR CLEAR 08/13/2018 0325   LABSPEC 1.020 08/13/2018 0325   PHURINE 7.0 08/13/2018 0325   GLUCOSEU NEGATIVE 08/13/2018 0325   HGBUR NEGATIVE 08/13/2018 0325   BILIRUBINUR NEGATIVE 08/13/2018 0325   KETONESUR NEGATIVE 08/13/2018 0325   PROTEINUR NEGATIVE 08/13/2018 0325   UROBILINOGEN 0.2 12/22/2013 1518    NITRITE NEGATIVE 08/13/2018 0325   LEUKOCYTESUR NEGATIVE 08/13/2018 0325    Radiological Exams on Admission: Dg Ribs Unilateral W/chest Right  Result Date: 12/01/2018 CLINICAL DATA:  Pt complains of R lower, medio-lateral rib pain. Pt states he woke up today and his ribs hurt. He thought he slept on the remote, but the pain has not gone away, and is worse with inspiration. Pt was having difficulty taking deep breaths when being x-rayed. Pt denies injury. BB marker placed for oblique images. EXAM: RIGHT RIBS AND CHEST - 3+ VIEW COMPARISON:  10/08/2013 FINDINGS: No rib fracture or rib lesion. Cardiac silhouette is normal in size. Normal mediastinal and hilar contours. Lungs are mildly hyperexpanded. There is mild interstitial prominence most evident in the right lateral lung base. No evidence of pneumonia or pulmonary edema. No pleural effusion or pneumothorax. IMPRESSION: 1. No rib fracture or rib lesion. 2. No acute cardiopulmonary disease. Electronically Signed   By: Lajean Manes M.D.   On: 12/01/2018 20:31   Ct Angio Chest Pe W And/or Wo Contrast  Result Date: 12/02/2018 CLINICAL DATA:  Right-sided chest pain.  PE suspected. EXAM: CT ANGIOGRAPHY CHEST WITH CONTRAST TECHNIQUE: Multidetector CT imaging of the chest was performed using the standard protocol during bolus administration of intravenous contrast. Multiplanar CT image reconstructions and MIPs were obtained to evaluate the vascular anatomy. CONTRAST:  53mL OMNIPAQUE IOHEXOL 350 MG/ML SOLN COMPARISON:  CTA abdomen and pelvis 08/13/2018. CT stone study 11/04/2015. FINDINGS: Cardiovascular: The heart size is normal. No substantial pericardial effusion. Atherosclerotic calcification is noted in the wall of the thoracic aorta. No filling defect within the opacified pulmonary arteries to suggest the presence of an acute pulmonary embolus. Mediastinum/Nodes: No mediastinal lymphadenopathy. There is no hilar lymphadenopathy. The esophagus has normal  imaging features. There is no axillary lymphadenopathy. Lungs/Pleura: Centrilobular and paraseptal emphysema evident. Patchy airspace opacity noted right lung base, suspicious for pneumonia. No pleural effusion. No overtly suspicious nodule or mass. Upper Abdomen: 1.7 cm low-density lesion in the right kidney is stable since CT scan of 11/04/2015 consistent with benign etiology such as cyst. Musculoskeletal: No worrisome lytic or sclerotic osseous abnormality. Review of the MIP images confirms the above findings. IMPRESSION: 1. New focal airspace disease in the extreme right lung base, new since CT of 08/13/2018. Imaging features compatible with pneumonia. 2. No CT evidence for acute pulmonary embolus. 3.  Emphysema. (ICD10-J43.9) 4.  Aortic Atherosclerois (ICD10-170.0) Electronically Signed   By: Misty Stanley M.D.   On: 12/02/2018  08:59    EKG: Independently reviewed by me: Sinus tachycardia Otherwise normal ECG more prominent t waves in V2 but also appears to be somewhat related to lead placement improved ST changes in inferior leads compared to August 12, 2018  Assessment/Plan Principal Problem:   Acute respiratory failure with hypoxemia [facility]) Active Problems:   Multifocal pneumonia   PVD (peripheral vascular disease) (HCC)   Tobacco abuse   History of bilateral hip replacements   1.  Acute respiratory failure with hypoxemia: This comes on mostly with ambulation.  Patient will be requiring 2 L of oxygen at rest and 2 L with ambulation as when he is being hydrated his sats decreased.  I have ordered some fluid resuscitation for him.  I do not believe he is septic he has no signs or symptoms of sepsis.  2.  Multifocal pneumonia: Presentation seems very consistent with a viral pneumonia given that the patient has patchy infiltrates, no elevated white blood cell count, no fever.  His COVID-19 rapid test is negative the PCR test is pending.  We will also check a respiratory pathogen panel.  It is  possible he has influenza as well.  Start him on azithromycin and Rocephin as per ER plan.  3.  Peripheral vascular disease: Status post lower extremity bypass surgery most recently in July of this year the patient had a right to left femorofemoral artery bypass by Dr. Darrick Penna.  He continues to follow-up.  4.  Aortic atherosclerosis: On CT scan this is a new diagnosis.  Patient will need to follow-up with vascular surgery for now plan to monitor pulses and blood pressures closely.  5.  Tobacco abuse: Patient is down to smoking 3 cigarettes a day.  Prior to his vascular surgery was smoking much more than that.  He is encouraged to continue to cut down to the point where he can quit altogether.  6.  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  This is noted.  Continue management.  7.  Acute versus chronic kidney disease stage III: Creatinine 1.2 with a GFR of less than 60 today.  Will hydrate and monitor hopefully this can resolve.  DVT prophylaxis: Lovenox Code Status: Full code Family Communication: No family present patient did not request communication. Disposition Plan: Likely home in 2 to 3 days Consults called: None Admission status: It is my clinical opinion that referral for OBSERVATION is reasonable and necessary in this patient based on the above information provided. The aforementioned taken together are felt to place the patient at high risk for further clinical deterioration. However it is anticipated that the patient may be medically stable for discharge from the hospital within 24 to 48 hours.    Lahoma Crocker MD FACP [facility] Pager [phone]  How to contact the [facility] Attending or Consulting provider 7A - 7P or covering provider during after hours 7P -7A, for this patient?  1. Check the care team in [facility] and look for a) attending/consulting TR[facility] provider listed and b) the [facility] team listed 2. Log into [email or web address] and use [facility]'s universal password to access. If you do not have the password, please contact the hospital operator. 3. Locate the [facility] provider you are looking for under [facility] and page to a number that you can be directly reached. 4. If you still have difficulty reaching the provider, please page the [facility] (Director on Call) for the Hospitalists listed on amion for assistance.  If 7PM-7AM,  please contact night-coverage [email or web address] Password [email or web address]  12/02/2018, 1:25 PM

## 2018-12-02 NOTE — ED Notes (Signed)
Ct informed this RN pt unable to tolerate lying flat for study due to pain.  Will inform MD.

## 2018-12-02 NOTE — ED Provider Notes (Signed)
Emergency Department Provider Note   I have reviewed the triage vital signs and the nursing notes.   HISTORY  Chief Complaint Chest Pain   HPI James Stevens is a 58 y.o. male who presents to the emergency department today with chest pain. right lower rib area. Pain with palpation. Been there for about a day. Thought it was because he laid on a remote but seems worse than would make sense. He is also sob. Has h/o leg clots. No other trauma.   No other associated or modifying symptoms.    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   Peripheral vascular disease (HCC)    Schizophrenia (HCC)     Patient Active Problem List   Diagnosis Date Noted   Infected prosthetic vascular graft (HCC) 08/06/2018   History of bilateral hip replacements 07/03/2017   Bilateral hip pain 07/03/2017   Lumbar herniated disc 03/23/2016   Spinal stenosis, lumbar region, with neurogenic claudication 05/05/2014    Class: Chronic   HNP (herniated nucleus pulposus), lumbar 05/05/2014    Class: Chronic   PVD (peripheral vascular disease) (HCC) 10/30/2013   Aortoiliac occlusive disease (HCC) 10/07/2013   Atherosclerosis of native arteries of the extremities with intermittent claudication 09/05/2013   Peripheral vascular disease, unspecified (HCC) 08/29/2013   Avascular necrosis of hip (HCC) 04/17/2012    Past Surgical History:  Procedure Laterality Date   ABDOMINAL AORTOGRAM W/LOWER EXTREMITY Bilateral 06/29/2018   Procedure: ABDOMINAL AORTOGRAM W/LOWER EXTREMITY;  Surgeon: Sherren Kerns, MD;  Location: [facility] INVASIVE CV LAB;  Service: Cardiovascular;  Laterality: Bilateral;   AORTA -  BILATERAL FEMORAL ARTERY BYPASS GRAFT N/A 10/07/2013   Procedure: AORTA BIFEMORAL BYPASS GRAFT;  Surgeon: Sherren Kerns, MD;  Location: [facility] OR;  Service: Vascular;  Laterality: N/A;   ENDARTERECTOMY FEMORAL Left 08/06/2018   Procedure: Left Common Femoral Endarterectomy;  Surgeon: Sherren Kerns, MD;  Location: [facility] OR;  Service: Vascular;  Laterality: Left;   ESOPHAGEAL DILATION     ESOPHAGOGASTRODUODENOSCOPY  09/06/2011   Procedure: ESOPHAGOGASTRODUODENOSCOPY (EGD);  Surgeon: Vertell Novak., MD;  Location: [facility];  Service: Endoscopy;  Laterality: N/A;   EYE SURGERY     FEMORAL-FEMORAL BYPASS GRAFT Left 08/06/2018   Procedure: Ligation and Removal of Left Limb of Aortobifemoral Bypass Graft;  Surgeon: Sherren Kerns, MD;  Location: [facility] OR;  Service: Vascular;  Laterality: Left;   FEMORAL-FEMORAL BYPASS GRAFT Bilateral 08/10/2018   Procedure: RIGHT TO LEFT FEMORAL TO FEMORAL ARTERY BYPASS USING 83mm X 60cm HEMASHIELD GOLD GRAFT;  Surgeon: Sherren Kerns, MD;  Location: [facility] OR;  Service: Vascular;  Laterality: Bilateral;   FRACTURE SURGERY     left fingers # 4,5   JOINT REPLACEMENT Bilateral    rt hip replacement, and  LT hip   LUMBAR LAMINECTOMY N/A 05/05/2014   Procedure: BILATERAL LUMBAR LAMINECTOMY L4-5 AND L5-S1;  Surgeon: Kerrin Champagne, MD;  Location: [facility] OR;  Service: Orthopedics;  Laterality: N/A;   MANDIBLE FRACTURE SURGERY     PATCH ANGIOPLASTY Left 08/06/2018   Procedure: Patch Angioplasty Left Common Femoral Artery with Greater Saphenous Vein;  Surgeon: Sherren KernsFields, Charles E, MD;  [facility];  Service: Vascular;  Laterality: Left;   SAVORY DILATION  09/06/2011   Procedure: Gaspar BiddingSAVORY DILATION;  Surgeon: Vertell NovakJames L Edwards Jr., MD;  [facility];  Service: Endoscopy;  Laterality: N/A;   THROMBECTOMY FEMORAL ARTERY Bilateral 08/10/2018   Procedure: FEMORAL ARTERY THROMBECTOMY;  Surgeon: Sherren KernsFields, Charles E, MD;  [facility];  Service: Vascular;  Laterality:  Bilateral;   TOTAL HIP ARTHROPLASTY Left 04/17/2012   Procedure: LEFT TOTAL HIP ARTHROPLASTY ANTERIOR APPROACH;  Surgeon: Kathryne Hitchhristopher Y Blackman, MD;  Location: [facility] OR;  Service: Orthopedics;  Laterality: Left;    Current Outpatient Rx   Order #: [id number] Class: Normal   Order #: [id number] Class: Historical Med    Allergies Ibuprofen, Lyrica [pregabalin], and Tramadol  Family History  Problem Relation Age of Onset   Diabetes Mother    Diabetes Father    Diabetes Other    Diabetes Brother     Social History Social History   Tobacco Use   Smoking status: Current Every Day Smoker    Packs/day: 0.25    Years: 34.00    Pack years: 8.50    Types: Cigarettes   Smokeless tobacco: Never Used   Tobacco comment: 3-4 cigarettes per day  Substance Use Topics   Alcohol use: Yes    Comment: once a week    Drug use: Yes    Frequency: 2.0 times per week    Types: Cocaine, Marijuana    Review of Systems  All other systems negative except as documented in the HPI. All pertinent positives and negatives as reviewed in the HPI. ____________________________________________   PHYSICAL EXAM:  VITAL SIGNS: ED Triage Vitals  Enc Vitals Group     BP 12/01/18 1955 128/78     Pulse Rate 12/01/18 1955 93     Resp 12/01/18 1955 (!) 24     Temp 12/01/18 1955 98.1 F (36.7 C)     Temp Source 12/01/18 1955 Oral     SpO2 12/01/18 1955 100 %     Weight 12/01/18 1956 143 lb (64.9 kg)     Height 12/01/18 1956 5\' 10"  (1.778 m)    Constitutional: Alert and oriented. Well appearing and in no acute distress. Eyes: Conjunctivae are normal. PERRL. EOMI. Head: Atraumatic. Nose: No congestion/rhinnorhea. Mouth/Throat: Mucous membranes are moist.  Oropharynx non-erythematous. Neck: No stridor.  No meningeal signs.   Cardiovascular: Normal rate, regular rhythm. Good peripheral circulation. Grossly normal heart sounds.   Respiratory: tachypneic respiratory effort.  No retractions. Lungs  CTAB. Gastrointestinal: Soft and nontender. No distention.  Musculoskeletal: No lower extremity tenderness nor edema. No gross deformities of extremities. Neurologic:  Normal speech and language. No gross focal neurologic deficits are appreciated.  Skin:  Skin is warm, dry and intact. No rash noted.   ____________________________________________   LABS (all labs ordered are listed, but only abnormal results are displayed)  Labs Reviewed  BASIC METABOLIC PANEL - Abnormal; Notable for the following components:      Result Value   Glucose, Bld 110 (*)    Creatinine, Ser 1.34 (*)    GFR calc non Af Amer 58 (*)    All other components within normal limits  CBC  TROPONIN I (HIGH SENSITIVITY)  TROPONIN I (HIGH SENSITIVITY)   ____________________________________________  EKG   EKG Interpretation  Date/Time:  Saturday December 01 2018 19:57:06 EST Ventricular Rate:  102 PR Interval:  130 QRS Duration: 84 QT Interval:  332 QTC Calculation: 432 R  Axis:   78 Text Interpretation: Sinus tachycardia Otherwise normal ECG more prominent t waves in V2 but also appears to be somewhat related to lead placement improved ST changes in inferior leads compared to august Confirmed by Merrily Pew [phone]) on 12/01/2018 11:07:20 PM       ____________________________________________  RADIOLOGY  Dg Ribs Unilateral W/chest Right  Result Date: 12/01/2018 CLINICAL DATA:  Pt complains of R lower, medio-lateral rib pain. Pt states he woke up today and his ribs hurt. He thought he slept on the remote, but the pain has not gone away, and is worse with inspiration. Pt was having difficulty taking deep breaths when being x-rayed. Pt denies injury. BB marker placed for oblique images. EXAM: RIGHT RIBS AND CHEST - 3+ VIEW COMPARISON:  10/08/2013 FINDINGS: No rib fracture or rib lesion. Cardiac silhouette is normal in size. Normal mediastinal and hilar contours. Lungs are mildly hyperexpanded. There is mild  interstitial prominence most evident in the right lateral lung base. No evidence of pneumonia or pulmonary edema. No pleural effusion or pneumothorax. IMPRESSION: 1. No rib fracture or rib lesion. 2. No acute cardiopulmonary disease. Electronically Signed   By: Lajean Manes M.D.   On: 12/01/2018 20:31    ____________________________________________   PROCEDURES  Procedure(s) performed:   Procedures   ____________________________________________   INITIAL IMPRESSION / ASSESSMENT AND PLAN / ED COURSE  Suspect this is likely musculoskeletal in nature however patient is tachypneic with oxygen in the 90-94% range with pleuritic pain.  We will get a CT scan to further evaluate lung parenchyma, bony injury and for PE.  Pain medication ordered.  This is negative patient be discharged on anti-inflammatories.  Care transferred pending ct scan and reevaluation for disposition.      Pertinent labs & imaging results that were available during my care of the patient were reviewed by me and considered in my medical decision making (see chart for details).  ____________________________________________  FINAL CLINICAL IMPRESSION(S) / ED DIAGNOSES  Final diagnoses:  None     MEDICATIONS GIVEN DURING THIS VISIT:  Medications  sodium chloride flush (NS) 0.9 % injection 3 mL (3 mLs Intravenous Not Given 12/02/18 0616)  fentaNYL (SUBLIMAZE) injection 50 mcg (has no administration in time range)     NEW OUTPATIENT MEDICATIONS STARTED DURING THIS VISIT:  New Prescriptions   No medications on file    Note:  This note was prepared with assistance of Dragon voice recognition software. Occasional wrong-word or sound-a-like substitutions may have occurred due to the inherent limitations of voice recognition software.   Merrily Pew, MD 12/03/18 1530

## 2018-12-02 NOTE — ED Notes (Signed)
Pt refusing lab draws. Phlebotomy at bedside, pt becoming argumentitve.

## 2018-12-02 NOTE — ED Notes (Signed)
Attempted IV access x2, will request secondary RN to attempt

## 2018-12-02 NOTE — ED Notes (Signed)
ED Provider at bedside. 

## 2018-12-02 NOTE — ED Notes (Signed)
Admitting requesting pt be moved into a private room from current hallway bed. Charge RN aware.

## 2018-12-02 NOTE — ED Provider Notes (Signed)
7:47 AM Care assumed from Dr. Dayna Barker.  At time of transfer care, patient is awaiting results of CT PE study.  If CT study does not show up on embolism, anticipate discharge with prescription for muscle relaxant due to likely musculoskeletal pain.  11:25 AM CT scan did not show pulmonary embolism but did show evidence of pneumonia.  Patient was ambulated and oxygen saturation dropped into the 80s and he was extremely short of breath.  Patient reports is not feel safe going home as he does not have history of oxygen use at home.  Patient will have Covid testing ordered and he will be given antibiotics.  Patient be admitted for hypoxia with pneumonia.   Clinical Impression: 1. Community acquired pneumonia, unspecified laterality   2. Shortness of breath   3. Hypoxia     Disposition: Admit  This note was prepared with assistance of Dragon voice recognition software. Occasional wrong-word or sound-a-like substitutions may have occurred due to the inherent limitations of voice recognition software.       Tegeler, Gwenyth Allegra, MD 12/02/18 [phone]

## 2018-12-02 NOTE — ED Notes (Signed)
Ambulated patient on RA, pt's Sp02 dropped into the mid 80's while ambulating.  Once pt returned to stretcher, Sp02 returned to 98-100% on RA.

## 2018-12-02 NOTE — ED Notes (Signed)
Gave pt "ER Happy Meal" and coffee, per Dr. Sherry Ruffing.

## 2018-12-02 NOTE — ED Notes (Signed)
Report called, pt can go up at [facility] states pt to go bed 21

## 2018-12-02 NOTE — ED Notes (Signed)
Attempted blood cultures. No success.

## 2018-12-02 NOTE — ED Notes (Addendum)
ED TO INPATIENT HANDOFF REPORT  ED Nurse Name and Phone #:   S Name/Age/Gender James Stevens 58 y.o. male Room/Bed: H015C/H015C  Code Status   Code Status: Full Code  Home/SNF/Other Home Patient oriented to: self, place, time and situation Is this baseline? Yes   Triage Complete: Triage complete  Chief Complaint rib pain w/ breathing  Triage Note Pt from home with ems c.o right sided ribcage pain that started yesterday, worse when he takes a deep breath and worse with palpation. Denies fall or trauma, no bruising noted. Pt a.o, nad noted   150/100 P 100   Allergies Allergies  Allergen Reactions  . Ibuprofen Other (See Comments)    Gastric irritation  . Lyrica [Pregabalin] Anxiety  . Tramadol Itching    Level of Care/Admitting Diagnosis ED Disposition    ED Disposition Condition Comment   Admit  Hospital Area: [facility] [[id number]]  Level of Care: Med-Surg [16]  I expect the patient will be discharged within 24 hours: Yes  LOW acuity---Tx typically complete <24 hrs---ACUTE conditions typically can be evaluated <24 hours---LABS likely to return to acceptable levels <24 hours---IS near functional baseline---EXPECTED to return to current living arrangement---NOT newly hypoxic: Meets criteria for 5C-Observation unit  Covid Evaluation: Asymptomatic Screening Protocol (No Symptoms)  Diagnosis: Multifocal pneumonia [[id number]]  Admitting Physician: Lady Deutscher [[id number]]  Attending Physician: Lady Deutscher [[id number]]  PT Class (Do Not Modify): Observation [104]  PT Acc Code (Do Not Modify): Observation [10022]       B Medical/Surgery History Past Medical History:  Diagnosis Date  . Arthritis   . Bipolar affective ([place])   . Chronic back pain    and right hip  . Depression   . GERD (gastroesophageal reflux disease)    occ tums  . Headache(784.0)    migraines  . Hip pain   . 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 Peripheral vascular disease ([place])   . Schizophrenia [facility])    Past Surgical History:  Procedure Laterality Date  . ABDOMINAL AORTOGRAM W/LOWER EXTREMITY Bilateral 06/29/2018   Procedure: ABDOMINAL AORTOGRAM W/LOWER EXTREMITY;  Surgeon: Elam Dutch, MD;  Location: [facility];  Service: Cardiovascular;  Laterality: Bilateral;  . AORTA - BILATERAL FEMORAL ARTERY BYPASS GRAFT N/A 10/07/2013   Procedure: AORTA BIFEMORAL BYPASS GRAFT;  Surgeon: Elam Dutch, MD;  Location: [facility];  Service: Vascular;  Laterality: N/A;  . ENDARTERECTOMY FEMORAL Left 08/06/2018   Procedure: Left Common Femoral Endarterectomy;  Surgeon: Elam Dutch, MD;  Location: [facility];  Service: Vascular;  Laterality: Left;  . ESOPHAGEAL DILATION    . ESOPHAGOGASTRODUODENOSCOPY  09/06/2011   Procedure: ESOPHAGOGASTRODUODENOSCOPY (EGD);  Surgeon: Winfield Cunas., MD;  Location: [facility];  Service: Endoscopy;  Laterality: N/A;  . EYE SURGERY    . FEMORAL-FEMORAL BYPASS GRAFT Left 08/06/2018   Procedure: Ligation and Removal of Left Limb of Aortobifemoral Bypass Graft;  Surgeon: Elam Dutch, MD;  Location: [facility];  Service: Vascular;  Laterality: Left;  . FEMORAL-FEMORAL BYPASS GRAFT Bilateral 08/10/2018   Procedure: RIGHT TO LEFT FEMORAL TO FEMORAL ARTERY BYPASS USING 67m X 60cm HEMASHIELD GOLD GRAFT;  Surgeon: FElam Dutch MD;  Location: [facility]  Service: Vascular;  Laterality: Bilateral;  . FRACTURE SURGERY     left fingers # 4,5  . JOINT REPLACEMENT Bilateral    rt hip replacement, and  LT hip  .  LUMBAR LAMINECTOMY N/A 05/05/2014   Procedure: BILATERAL LUMBAR LAMINECTOMY L4-5 AND L5-S1;  Surgeon: Jessy Oto, MD;  Location: [facility];  Service: Orthopedics;  Laterality: N/A;  . MANDIBLE FRACTURE SURGERY    . PATCH ANGIOPLASTY Left 08/06/2018   Procedure: Patch Angioplasty Left Common Femoral Artery with Greater Saphenous Vein;  Surgeon: Elam Dutch,  MD;  Location: [facility];  Service: Vascular;  Laterality: Left;  . SAVORY DILATION  09/06/2011   Procedure: SAVORY DILATION;  Surgeon: Winfield Cunas., MD;  Location: [facility];  Service: Endoscopy;  Laterality: N/A;  . THROMBECTOMY FEMORAL ARTERY Bilateral 08/10/2018   Procedure: FEMORAL ARTERY THROMBECTOMY;  Surgeon: Elam Dutch, MD;  Location: [facility];  Service: Vascular;  Laterality: Bilateral;  . TOTAL HIP ARTHROPLASTY Left 04/17/2012   Procedure: LEFT TOTAL HIP ARTHROPLASTY ANTERIOR APPROACH;  Surgeon: Mcarthur Rossetti, MD;  Location: [facility];  Service: Orthopedics;  Laterality: Left;     A IV Location/Drains/Wounds Patient Lines/Drains/Airways Status   Active Line/Drains/Airways    Name:   Placement date:   Placement time:   Site:   Days:   Peripheral IV 12/02/18 Left Antecubital   12/02/18    0636    Antecubital   less than 1   PICC Single Lumen 08/11/18 PICC Right Brachial 40 cm 0 cm   08/11/18    1207    Brachial   113   Incision (Closed) 08/06/18 Groin Left   08/06/18    1139     118   Incision (Closed) 08/10/18 Groin Left   08/10/18    0823     114   Incision (Closed) 08/10/18 Groin Right   08/10/18    0823     114          Intake/Output Last 24 hours  Intake/Output Summary (Last 24 hours) at 12/02/2018 1822 Last data filed at 12/02/2018 1636 Gross per 24 hour  Intake 250 ml  Output 300 ml  Net -50 ml    Labs/Imaging Results for orders placed or performed during the hospital encounter of 12/01/18 (from the past 48 hour(s))  Basic metabolic panel     Status: Abnormal   Collection Time: 12/01/18  8:05 PM  Result Value Ref Range   Sodium 136 135 - 145 mmol/L   Potassium 3.7 3.5 - 5.1 mmol/L   Chloride 102 98 - 111 mmol/L   CO2 24 22 - 32 mmol/L   Glucose, Bld 110 (H) 70 - 99 mg/dL   BUN 10 6 - 20 mg/dL   Creatinine, Ser 1.34 (H) 0.61 - 1.24 mg/dL   Calcium 9.5 8.9 - 10.3 mg/dL   GFR calc non Af Amer 58 (L) >60 mL/min   GFR calc Af Amer >60 >60 mL/min    Anion gap 10 5 - 15    Comment: Performed at [facility], [place] [address] [place] [address]  CBC     Status: None   Collection Time: 12/01/18  8:05 PM  Result Value Ref Range   WBC 10.0 4.0 - 10.5 K/uL   RBC 5.05 4.22 - 5.81 MIL/uL   Hemoglobin 16.5 13.0 - 17.0 g/dL   HCT 49.0 39.0 - 52.0 %   MCV 97.0 80.0 - 100.0 fL   MCH 32.7 26.0 - 34.0 pg   MCHC 33.7 30.0 - 36.0 g/dL   RDW 15.0 11.5 - 15.5 %   Platelets 199 150 - 400 K/uL   nRBC 0.0 0.0 -  0.2 %    Comment: Performed at [facility], [place] [address], [place], [place] [address]  Troponin I (High Sensitivity)     Status: None   Collection Time: 12/01/18  8:05 PM  Result Value Ref Range   Troponin I (High Sensitivity) 3 <18 ng/L    Comment: (NOTE) Elevated high sensitivity troponin I (hsTnI) values and significant  changes across serial measurements may suggest ACS but many other  chronic and acute conditions are known to elevate hsTnI results.  Refer to the "Links" section for chest pain algorithms and additional  guidance. Performed at [facility], [place] [address] [place], [place] [address]   Troponin I (High Sensitivity)     Status: None   Collection Time: 12/02/18 12:26 AM  Result Value Ref Range   Troponin I (High Sensitivity) <2 <18 ng/L    Comment: Performed at [facility] [address], [place], [place] [address]  SARS CORONAVIRUS 2 (TAT 6-24 HRS) Nasopharyngeal Nasopharyngeal Swab     Status: None   Collection Time: 12/02/18 11:28 AM   Specimen: Nasopharyngeal Swab  Result Value Ref Range   SARS Coronavirus 2 NEGATIVE NEGATIVE    Comment: (NOTE) SARS-CoV-2 target nucleic acids are NOT DETECTED. The SARS-CoV-2 RNA is generally detectable in upper and lower respiratory specimens during the acute phase of infection. Negative results do not preclude SARS-CoV-2 infection, do not rule out co-infections with other pathogens, and should not be used as the sole basis for treatment  or other patient management decisions. Negative results must be combined with clinical observations, patient history, and epidemiological information. The expected result is Negative. Fact Sheet for Patients: SugarRoll.be Fact Sheet for Healthcare Providers: https://www.woods-mathews.com/ This test is not yet approved or cleared by the [place] FDA and  has been authorized for detection and/or diagnosis of SARS-CoV-2 by FDA under an Emergency Use Authorization (EUA). This EUA will remain  in effect (meaning this test can be used) for the duration of the COVID-19 declaration under Section 56 4(b)(1) of the Act, 21 U.S.C. section 360bbb-3(b)(1), unless the authorization is terminated or revoked sooner. Performed at [facility], [place] [address] [place], [place] [address]   POC SARS Coronavirus 2 Ag-ED - Nasal Swab (BD Veritor Kit)     Status: None   Collection Time: 12/02/18 12:38 PM  Result Value Ref Range   SARS Coronavirus 2 Ag NEGATIVE NEGATIVE    Comment: (NOTE) SARS-CoV-2 antigen NOT DETECTED.  Negative results are presumptive.  Negative results do not preclude SARS-CoV-2 infection and should not be used as the sole basis for treatment or other patient management decisions, including infection  control decisions, particularly in the presence of clinical signs and  symptoms consistent with COVID-19, or in those who have been in contact with the virus.  Negative results must be combined with clinical observations, patient history, and epidemiological information. The expected result is Negative. Fact Sheet for Patients: PodPark.tn Fact Sheet for Healthcare Providers: GiftContent.is This test is not yet approved or cleared by the [place] FDA and  has been authorized for detection and/or diagnosis of SARS-CoV-2 by FDA under an Emergency Use Authorization (EUA).   This EUA will remain in effect (meaning this test can be used) for the duration of  the COVID-19 de claration under Section 564(b)(1) of the Act, 21 U.S.C. section 360bbb-3(b)(1), unless the authorization is terminated or revoked sooner.   HIV Antibody (routine testing w rflx)  Status: None   Collection Time: 12/02/18  2:46 PM  Result Value Ref Range   HIV Screen 4th Generation wRfx NON REACTIVE NON REACTIVE    Comment: Performed at [facility], [address] [place], [place] [address]  CBC     Status: Abnormal   Collection Time: 12/02/18  2:46 PM  Result Value Ref Range   WBC 10.9 (H) 4.0 - 10.5 K/uL   RBC 5.13 4.22 - 5.81 MIL/uL   Hemoglobin 16.7 13.0 - 17.0 g/dL   HCT 51.0 39.0 - 52.0 %   MCV 99.4 80.0 - 100.0 fL   MCH 32.6 26.0 - 34.0 pg   MCHC 32.7 30.0 - 36.0 g/dL   RDW 15.1 11.5 - 15.5 %   Platelets 212 150 - 400 K/uL   nRBC 0.0 0.0 - 0.2 %    Comment: Performed at [facility], [place] [address], [place], [place] [address]  Creatinine, serum     Status: Abnormal   Collection Time: 12/02/18  2:46 PM  Result Value Ref Range   Creatinine, Ser 1.32 (H) 0.61 - 1.24 mg/dL   GFR calc non Af Amer 59 (L) >60 mL/min   GFR calc Af Amer >60 >60 mL/min    Comment: Performed at [facility] [address], [place], [place] [address]  Respiratory Panel by PCR     Status: None   Collection Time: 12/02/18  2:46 PM   Specimen: Nasopharyngeal Swab; Respiratory  Result Value Ref Range   Adenovirus NOT DETECTED NOT DETECTED   Coronavirus 229E NOT DETECTED NOT DETECTED    Comment: (NOTE) The Coronavirus on the Respiratory Panel, DOES NOT test for the novel  Coronavirus (2019 nCoV)    Coronavirus HKU1 NOT DETECTED NOT DETECTED   Coronavirus NL63 NOT DETECTED NOT DETECTED   Coronavirus OC43 NOT DETECTED NOT DETECTED   Metapneumovirus NOT DETECTED NOT DETECTED   Rhinovirus / Enterovirus NOT DETECTED NOT DETECTED   Influenza A NOT DETECTED NOT DETECTED   Influenza  B NOT DETECTED NOT DETECTED   Parainfluenza Virus 1 NOT DETECTED NOT DETECTED   Parainfluenza Virus 2 NOT DETECTED NOT DETECTED   Parainfluenza Virus 3 NOT DETECTED NOT DETECTED   Parainfluenza Virus 4 NOT DETECTED NOT DETECTED   Respiratory Syncytial Virus NOT DETECTED NOT DETECTED   Bordetella pertussis NOT DETECTED NOT DETECTED   Chlamydophila pneumoniae NOT DETECTED NOT DETECTED   Mycoplasma pneumoniae NOT DETECTED NOT DETECTED    Comment: Performed at [facility], [place] [address] [place], [place] [address]   Dg Ribs Unilateral W/chest Right  Result Date: 12/01/2018 CLINICAL DATA:  Pt complains of R lower, medio-lateral rib pain. Pt states he woke up today and his ribs hurt. He thought he slept on the remote, but the pain has not gone away, and is worse with inspiration. Pt was having difficulty taking deep breaths when being x-rayed. Pt denies injury. BB marker placed for oblique images. EXAM: RIGHT RIBS AND CHEST - 3+ VIEW COMPARISON:  10/08/2013 FINDINGS: No rib fracture or rib lesion. Cardiac silhouette is normal in size. Normal mediastinal and hilar contours. Lungs are mildly hyperexpanded. There is mild interstitial prominence most evident in the right lateral lung base. No evidence of pneumonia or pulmonary edema. No pleural effusion or pneumothorax. IMPRESSION: 1. No rib fracture or rib lesion. 2. No acute cardiopulmonary disease. Electronically Signed   By: Lajean Manes M.D.   On: 12/01/2018 20:31   Ct Angio  Chest Pe W And/or Wo Contrast  Result Date: 12/02/2018 CLINICAL DATA:  Right-sided chest pain.  PE suspected. EXAM: CT ANGIOGRAPHY CHEST WITH CONTRAST TECHNIQUE: Multidetector CT imaging of the chest was performed using the standard protocol during bolus administration of intravenous contrast. Multiplanar CT image reconstructions and MIPs were obtained to evaluate the vascular anatomy. CONTRAST:  21m OMNIPAQUE IOHEXOL 350 MG/ML SOLN COMPARISON:  CTA abdomen and pelvis  08/13/2018. CT stone study 11/04/2015. FINDINGS: Cardiovascular: The heart size is normal. No substantial pericardial effusion. Atherosclerotic calcification is noted in the wall of the thoracic aorta. No filling defect within the opacified pulmonary arteries to suggest the presence of an acute pulmonary embolus. Mediastinum/Nodes: No mediastinal lymphadenopathy. There is no hilar lymphadenopathy. The esophagus has normal imaging features. There is no axillary lymphadenopathy. Lungs/Pleura: Centrilobular and paraseptal emphysema evident. Patchy airspace opacity noted right lung base, suspicious for pneumonia. No pleural effusion. No overtly suspicious nodule or mass. Upper Abdomen: 1.7 cm low-density lesion in the right kidney is stable since CT scan of 11/04/2015 consistent with benign etiology such as cyst. Musculoskeletal: No worrisome lytic or sclerotic osseous abnormality. Review of the MIP images confirms the above findings. IMPRESSION: 1. New focal airspace disease in the extreme right lung base, new since CT of 08/13/2018. Imaging features compatible with pneumonia. 2. No CT evidence for acute pulmonary embolus. 3.  Emphysema. (ICD10-J43.9) 4.  Aortic Atherosclerois (ICD10-170.0) Electronically Signed   By: EMisty StanleyM.D.   On: 12/02/2018 08:59    Pending Labs Unresulted Labs (From admission, onward)    Start     Ordered   12/09/18 0500  Creatinine, serum  (enoxaparin (LOVENOX)    CrCl >/= 30 ml/min)  Weekly,   R    Comments: while on enoxaparin therapy    12/02/18 1336   12/03/18 06237 Basic metabolic panel  Tomorrow morning,   R     12/02/18 1336   12/03/18 0500  CBC  Tomorrow morning,   R     12/02/18 1336   12/02/18 1129  Blood culture (routine x 2)  BLOOD CULTURE X 2,   STAT     12/02/18 1129          Vitals/Pain Today's Vitals   12/02/18 1445 12/02/18 1630 12/02/18 1635 12/02/18 1800  BP:   125/86 101/88  Pulse:   90   Resp:   (!) 26 (!) 27  Temp:      TempSrc:       SpO2:   94% 97%  Weight:      Height:      PainSc: 10-Worst pain ever 4       Isolation Precautions Airborne and Contact precautions  Medications Medications  sodium chloride flush (NS) 0.9 % injection 3 mL (3 mLs Intravenous Not Given 12/02/18 0616)  enoxaparin (LOVENOX) injection 40 mg (40 mg Subcutaneous Refused 12/02/18 1503)  0.9 % NaCl with KCl 20 mEq/ L  infusion ( Intravenous New Bag/Given 12/02/18 1634)  azithromycin (ZITHROMAX) tablet 500 mg (has no administration in time range)  cefTRIAXone (ROCEPHIN) 2 g in sodium chloride 0.9 % 100 mL IVPB (has no administration in time range)  oxyCODONE-acetaminophen (PERCOCET/ROXICET) 5-325 MG per tablet 0.5-1 tablet (has no administration in time range)    And  oxyCODONE (Oxy IR/ROXICODONE) immediate release tablet 2.5-5 mg (5 mg Oral Given 12/02/18 1447)  fentaNYL (SUBLIMAZE) injection 50 mcg (50 mcg Intravenous Given 12/02/18 0638)  fentaNYL (SUBLIMAZE) injection 50 mcg (50 mcg Intravenous Given 12/02/18 0808)  iohexol (OMNIPAQUE) 350 MG/ML injection 100 mL (71 mLs Intravenous Contrast Given 12/02/18 0826)  azithromycin (ZITHROMAX) 500 mg in sodium chloride 0.9 % 250 mL IVPB (0 mg Intravenous Stopped 12/02/18 1630)    Mobility walks Low fall risk   Focused Assessments Pulmonary Assessment Handoff:  Lung sounds:   O2 Device: Room Air        R Recommendations: See Admitting Provider Note  Report given to:   Additional Notes:   CT scan did not show pulmonary embolism but did show evidence of pneumonia.  Patient was ambulated and oxygen saturation dropped into the 80s and he was extremely short of breath.  Patient reports is not feel safe going home as he does not have history of oxygen use at home.

## 2018-12-03 ENCOUNTER — Encounter (HOSPITAL_COMMUNITY): Payer: Self-pay | Admitting: Internal Medicine

## 2018-12-03 DIAGNOSIS — J9601 Acute respiratory failure with hypoxia: Secondary | ICD-10-CM | POA: Diagnosis present

## 2018-12-03 DIAGNOSIS — I739 Peripheral vascular disease, unspecified: Secondary | ICD-10-CM | POA: Diagnosis present

## 2018-12-03 DIAGNOSIS — F17210 Nicotine dependence, cigarettes, uncomplicated: Secondary | ICD-10-CM | POA: Diagnosis present

## 2018-12-03 DIAGNOSIS — Z833 Family history of diabetes mellitus: Secondary | ICD-10-CM | POA: Diagnosis not present

## 2018-12-03 DIAGNOSIS — R0602 Shortness of breath: Secondary | ICD-10-CM | POA: Diagnosis present

## 2018-12-03 DIAGNOSIS — Z96643 Presence of artificial hip joint, bilateral: Secondary | ICD-10-CM | POA: Diagnosis present

## 2018-12-03 DIAGNOSIS — Z20828 Contact with and (suspected) exposure to other viral communicable diseases: Secondary | ICD-10-CM | POA: Diagnosis present

## 2018-12-03 DIAGNOSIS — J129 Viral pneumonia, unspecified: Secondary | ICD-10-CM | POA: Diagnosis not present

## 2018-12-03 DIAGNOSIS — I700 Atherosclerosis of aorta: Secondary | ICD-10-CM | POA: Diagnosis present

## 2018-12-03 LAB — CBC
HCT: 42.3 % (ref 39.0–52.0)
Hemoglobin: 14.2 g/dL (ref 13.0–17.0)
MCH: 32.3 pg (ref 26.0–34.0)
MCHC: 33.6 g/dL (ref 30.0–36.0)
MCV: 96.1 fL (ref 80.0–100.0)
Platelets: 179 10*3/uL (ref 150–400)
RBC: 4.4 MIL/uL (ref 4.22–5.81)
RDW: 14.7 % (ref 11.5–15.5)
WBC: 7.9 10*3/uL (ref 4.0–10.5)
nRBC: 0 % (ref 0.0–0.2)

## 2018-12-03 LAB — BASIC METABOLIC PANEL
Anion gap: 8 (ref 5–15)
BUN: 11 mg/dL (ref 6–20)
CO2: 24 mmol/L (ref 22–32)
Calcium: 9.2 mg/dL (ref 8.9–10.3)
Chloride: 107 mmol/L (ref 98–111)
Creatinine, Ser: 1.32 mg/dL — ABNORMAL HIGH (ref 0.61–1.24)
GFR calc Af Amer: >60 mL/min (ref >60)
GFR calc non Af Amer: 59 mL/min — ABNORMAL LOW (ref >60)
Glucose, Bld: 185 mg/dL — ABNORMAL HIGH (ref 70–99)
Potassium: 3.8 mmol/L (ref 3.5–5.1)
Sodium: 139 mmol/L (ref 135–145)

## 2018-12-03 MED ORDER — ALBUTEROL SULFATE (2.5 MG/3ML) 0.083% IN NEBU: 2.5000 mg | INHALATION_SOLUTION | Freq: Four times a day (QID) | RESPIRATORY_TRACT | Status: DC | PRN

## 2018-12-03 NOTE — Progress Notes (Signed)
SATURATION QUALIFICATIONS:  Patient Saturations on Room Air at Rest = 90%  Patient Saturations on Room Air while Ambulating = 83%  Patient Saturations on 2 Liters of oxygen while Ambulating = 92%

## 2018-12-03 NOTE — Progress Notes (Signed)
[facility] PROGRESS NOTE  James AnchorsDaniel J Stevens ZOX:[id number] DOB: 10/03/1960 DOA: 12/01/2018 PCP: Fleet ContrasAvbuere, Edwin, MD  Assessment/Plan: 1.  Acute respiratory failure with hypoxemia: likley related to multifocal pneumonia in setting of long term smoker.  with ambulation this am oxygen saturation level 83% with increased work of breathing. At rest 90%. Mild increased work of breathing with conversation. BS diminished throught -continue oxygen supplementation -monitor oxygen saturation level -IS -nebs -azithromycin for #2  2.  Multifocal pneumonia: Presentation seems very consistent with a viral pneumonia given that the patient has patchy infiltrates, no elevated white blood cell count, no fever.  His COVID-19 rapid test was negative. Respiratory panel negative, blood cultures no growth. Initially provided rocephin and azithromycin -continue azithromycin and rocephin  3.  Peripheral vascular disease: Status post lower extremity bypass surgery most recently in July of this year the patient had a right to left femorofemoral artery bypass by Dr. Darrick PennaFields.  He continues to follow-up.  4.  Aortic atherosclerosis: On CT scan this is a new diagnosis.  Patient will need to follow-up with vascular surgery for now plan to monitor pulses and blood pressures closely.  5.  Tobacco abuse: cessation counseling offered.  6.  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  This is noted.  Continue management.  7.  Acute versus chronic kidney disease stage III: Creatinine 1.2 with a GFR of less than 60 today.  -gentle iv fluids   Code Status: full Family Communication: patient Disposition Plan: home   Consultants:    Procedures:    Antibiotics:  Rocephin 11/22>>  Azithromycin 11/22>>  HPI/Subjective: Awake alert pleasant. Reports feeling better than yesterday but continues with DOE   Objective: Vitals:   12/03/18 0527 12/03/18 0925  BP: 123/79  100/81  Pulse: 81 85  Resp: 18 18  Temp: 98.6 F (37 C) 98.3 F (36.8 C)  SpO2: 100% 96%    Intake/Output Summary (Last 24 hours) at 12/03/2018 1105 Last data filed at 12/03/2018 0900 Gross per 24 hour  Intake 1531.39 ml  Output 950 ml  Net 581.39 ml   Filed Weights   12/01/18 1956 12/02/18 0622 12/02/18 2008  Weight: 64.9 kg 64.9 kg 64.9 kg    Exam:   General:  Thin awake no acute distress  Cardiovascular: rrr no mgr no LE edema  Respiratory: mild increased work of breathing with conversation. BS somewhat diminished faint wheeze no crackles  Abdomen: non-distended non-tender +BS no guarding or rebounding  Musculoskeletal: joints without swelling/erythema   Data Reviewed: Basic Metabolic Panel: Recent Labs  Lab 12/01/18 2005 12/02/18 1446 12/03/18 0916  NA 136  --  139  K 3.7  --  3.8  CL 102  --  107  CO2 24  --  24  GLUCOSE 110*  --  185*  BUN 10  --  11  CREATININE 1.34* 1.32* 1.32*  CALCIUM 9.5  --  9.2   Liver Function Tests: No results for input(s): AST, ALT, ALKPHOS, BILITOT, PROT, ALBUMIN in the last 168 hours. No results for input(s): LIPASE, AMYLASE in the last 168 hours. No results for input(s): AMMONIA in the last 168 hours. CBC: Recent Labs  Lab 12/01/18 2005 12/02/18 1446 12/03/18 0916  WBC 10.0 10.9* 7.9  HGB 16.5 16.7 14.2  HCT 49.0 51.0 42.3  MCV 97.0 99.4 96.1  PLT 199 212 179   Cardiac Enzymes: No results for input(s): CKTOTAL, CKMB, CKMBINDEX, TROPONINI in the last 168 hours. BNP (last 3  results) No results for input(s): BNP in the last 8760 hours.  ProBNP (last 3 results) No results for input(s): PROBNP in the last 8760 hours.  CBG: No results for input(s): GLUCAP in the last 168 hours.  Recent Results (from the past 240 hour(s))  SARS CORONAVIRUS 2 (TAT 6-24 HRS) Nasopharyngeal Nasopharyngeal Swab     Status: None   Collection Time: 12/02/18 11:28 AM   Specimen: Nasopharyngeal Swab  Result Value Ref Range Status    SARS Coronavirus 2 NEGATIVE NEGATIVE Final    Comment: (NOTE) SARS-CoV-2 target nucleic acids are NOT DETECTED. The SARS-CoV-2 RNA is generally detectable in upper and lower respiratory specimens during the acute phase of infection. Negative results do not preclude SARS-CoV-2 infection, do not rule out co-infections with other pathogens, and should not be used as the sole basis for treatment or other patient management decisions. Negative results must be combined with clinical observations, patient history, and epidemiological information. The expected result is Negative. Fact Sheet for Patients: HairSlick.no Fact Sheet for Healthcare Providers: quierodirigir.com This test is not yet approved or cleared by the [place] FDA and  has been authorized for detection and/or diagnosis of SARS-CoV-2 by FDA under an Emergency Use Authorization (EUA). This EUA will remain  in effect (meaning this test can be used) for the duration of the COVID-19 declaration under Section 56 4(b)(1) of the Act, 21 U.S.C. section 360bbb-3(b)(1), unless the authorization is terminated or revoked sooner. Performed at [facility], [address], [place], [place] [address]   Blood culture (routine x 2)     Status: None (Preliminary result)   Collection Time: 12/02/18 11:34 AM   Specimen: BLOOD  Result Value Ref Range Status   Specimen Description BLOOD LEFT ANTECUBITAL  Final   Special Requests   Final    BOTTLES DRAWN AEROBIC AND ANAEROBIC Blood Culture adequate volume   Culture   Final    NO GROWTH < 24 HOURS Performed at [facility], [address], [place], [place] [address]    Report Status PENDING  Incomplete  Blood culture (routine x 2)     Status: None (Preliminary result)   Collection Time: 12/02/18 12:27 PM   Specimen: BLOOD  Result Value Ref Range Status   Specimen Description BLOOD LEFT ANTECUBITAL  Final   Special  Requests   Final    AEROBIC BOTTLE ONLY Blood Culture results may not be optimal due to an inadequate volume of blood received in culture bottles   Culture   Final    NO GROWTH < 24 HOURS Performed at [facility], [address] [place], [place] [address]    Report Status PENDING  Incomplete  Respiratory Panel by PCR     Status: None   Collection Time: 12/02/18  2:46 PM   Specimen: Nasopharyngeal Swab; Respiratory  Result Value Ref Range Status   Adenovirus NOT DETECTED NOT DETECTED Final   Coronavirus 229E NOT DETECTED NOT DETECTED Final    Comment: (NOTE) The Coronavirus on the Respiratory Panel, DOES NOT test for the novel  Coronavirus (2019 nCoV)    Coronavirus HKU1 NOT DETECTED NOT DETECTED Final   Coronavirus NL63 NOT DETECTED NOT DETECTED Final   Coronavirus OC43 NOT DETECTED NOT DETECTED Final   Metapneumovirus NOT DETECTED NOT DETECTED Final   Rhinovirus / Enterovirus NOT DETECTED NOT DETECTED Final   Influenza A NOT DETECTED NOT DETECTED Final   Influenza B NOT DETECTED NOT DETECTED Final  Parainfluenza Virus 1 NOT DETECTED NOT DETECTED Final   Parainfluenza Virus 2 NOT DETECTED NOT DETECTED Final   Parainfluenza Virus 3 NOT DETECTED NOT DETECTED Final   Parainfluenza Virus 4 NOT DETECTED NOT DETECTED Final   Respiratory Syncytial Virus NOT DETECTED NOT DETECTED Final   Bordetella pertussis NOT DETECTED NOT DETECTED Final   Chlamydophila pneumoniae NOT DETECTED NOT DETECTED Final   Mycoplasma pneumoniae NOT DETECTED NOT DETECTED Final    Comment: Performed at [facility], [address], [place], [place] [address]     Studies: Dg Ribs Unilateral W/chest Right  Result Date: 12/01/2018 CLINICAL DATA:  Pt complains of R lower, medio-lateral rib pain. Pt states he woke up today and his ribs hurt. He thought he slept on the remote, but the pain has not gone away, and is worse with inspiration. Pt was having difficulty taking deep breaths when being x-rayed.  Pt denies injury. BB marker placed for oblique images. EXAM: RIGHT RIBS AND CHEST - 3+ VIEW COMPARISON:  10/08/2013 FINDINGS: No rib fracture or rib lesion. Cardiac silhouette is normal in size. Normal mediastinal and hilar contours. Lungs are mildly hyperexpanded. There is mild interstitial prominence most evident in the right lateral lung base. No evidence of pneumonia or pulmonary edema. No pleural effusion or pneumothorax. IMPRESSION: 1. No rib fracture or rib lesion. 2. No acute cardiopulmonary disease. Electronically Signed   By: Amie Portland M.D.   On: 12/01/2018 20:31   Ct Angio Chest Pe W And/or Wo Contrast  Result Date: 12/02/2018 CLINICAL DATA:  Right-sided chest pain.  PE suspected. EXAM: CT ANGIOGRAPHY CHEST WITH CONTRAST TECHNIQUE: Multidetector CT imaging of the chest was performed using the standard protocol during bolus administration of intravenous contrast. Multiplanar CT image reconstructions and MIPs were obtained to evaluate the vascular anatomy. CONTRAST:  71mL OMNIPAQUE IOHEXOL 350 MG/ML SOLN COMPARISON:  CTA abdomen and pelvis 08/13/2018. CT stone study 11/04/2015. FINDINGS: Cardiovascular: The heart size is normal. No substantial pericardial effusion. Atherosclerotic calcification is noted in the wall of the thoracic aorta. No filling defect within the opacified pulmonary arteries to suggest the presence of an acute pulmonary embolus. Mediastinum/Nodes: No mediastinal lymphadenopathy. There is no hilar lymphadenopathy. The esophagus has normal imaging features. There is no axillary lymphadenopathy. Lungs/Pleura: Centrilobular and paraseptal emphysema evident. Patchy airspace opacity noted right lung base, suspicious for pneumonia. No pleural effusion. No overtly suspicious nodule or mass. Upper Abdomen: 1.7 cm low-density lesion in the right kidney is stable since CT scan of 11/04/2015 consistent with benign etiology such as cyst. Musculoskeletal: No worrisome lytic or sclerotic  osseous abnormality. Review of the MIP images confirms the above findings. IMPRESSION: 1. New focal airspace disease in the extreme right lung base, new since CT of 08/13/2018. Imaging features compatible with pneumonia. 2. No CT evidence for acute pulmonary embolus. 3.  Emphysema. (ICD10-J43.9) 4.  Aortic Atherosclerois (ICD10-170.0) Electronically Signed   By: Kennith Center M.D.   On: 12/02/2018 08:59    Scheduled Meds: . azithromycin  500 mg Oral Daily  . enoxaparin (LOVENOX) injection  40 mg Subcutaneous Q24H  . sodium chloride flush  3 mL Intravenous Once   Continuous Infusions: . 0.9 % NaCl with KCl 20 mEq / L 75 mL/hr at 12/03/18 0528  . cefTRIAXone (ROCEPHIN)  IV      Principal Problem:   Acute respiratory failure with hypoxemia (HCC) Active Problems:   Multifocal pneumonia   History of bilateral hip replacements   Aortic atherosclerosis (HCC)  PVD (peripheral vascular disease) ([place])   Tobacco abuse    Time spent: 43 minutes    [place] NP  [facility]  If 7PM-7AM, please contact night-coverage at [email or web address], password [facility] 12/03/2018, 11:05 AM  LOS: 0 days

## 2018-12-04 LAB — BASIC METABOLIC PANEL
Anion gap: 9 (ref 5–15)
BUN: 10 mg/dL (ref 6–20)
CO2: 24 mmol/L (ref 22–32)
Calcium: 9.2 mg/dL (ref 8.9–10.3)
Chloride: 105 mmol/L (ref 98–111)
Creatinine, Ser: 1.3 mg/dL — ABNORMAL HIGH (ref 0.61–1.24)
GFR calc Af Amer: >60 mL/min (ref >60)
GFR calc non Af Amer: >60 mL/min (ref >60)
Glucose, Bld: 102 mg/dL — ABNORMAL HIGH (ref 70–99)
Potassium: 4.1 mmol/L (ref 3.5–5.1)
Sodium: 138 mmol/L (ref 135–145)

## 2018-12-04 MED ORDER — DOXYCYCLINE HYCLATE 100 MG PO CAPS: 100.0000 mg | ORAL_CAPSULE | Freq: Two times a day (BID) | ORAL | 0 refills | Status: DC

## 2018-12-04 NOTE — Progress Notes (Signed)
James Stevens to be discharged Home  per MD order. Discussed prescriptions and follow up appointments with the patient. Prescriptions given to patient; medication list explained in detail. Patient verbalized understanding.  Skin clean, dry and intact without evidence of skin break down, no evidence of skin tears noted. IV catheter discontinued intact. Site without signs and symptoms of complications. Dressing and pressure applied. Pt denies pain at the site currently. No complaints noted.  Patient free of lines, drains, and wounds.   An After Visit Summary (AVS) was printed and given to the patient. Patient escorted via wheelchair, and discharged home via private auto.  Shela Commons, RN

## 2018-12-04 NOTE — Progress Notes (Signed)
Patient ambulated from his room to the entrance of [place] station and back. Pt tolerated ambulation fairly, Oxygen saturation in the 80s, and pt was placed on 2L of O2. Will continue to monitor.   Taj Arteaga,RN.

## 2018-12-04 NOTE — Progress Notes (Signed)
Walked patient for o2 sats.  In room at rest was 90%.  Walked patient and o2 sat went up to 92%.  Back in room at rest o2 sat 92%.

## 2018-12-04 NOTE — Discharge Summary (Signed)
Physician Discharge Summary  James Stevens XBL:[id number] DOB: Jul 04, 1960 DOA: 12/01/2018  PCP: Nolene Ebbs, MD  Admit date: 12/01/2018 Discharge date: 12/04/2018  Admitted From: home Discharge disposition: home   Recommendations for Outpatient Follow-Up:   1. Smoking cessation Aortic atherosclerosis: On CT scan this is a new diagnosis. Patient will need to follow-up outpateint   Discharge Diagnosis:   Principal Problem:   Acute respiratory failure with hypoxemia (HCC) Active Problems:   PVD (peripheral vascular disease) (HCC)   History of bilateral hip replacements   Multifocal pneumonia   Tobacco abuse   Aortic atherosclerosis ([place])   Acute respiratory failure with hypoxia ([place])    Discharge Condition: Improved.  Diet recommendation: Low sodium, heart healthy.    Wound care: None.  Code status: Full.   History of Present Illness:   James Stevens is a 58 y.o. male with medical history significant of arthritis, bipolar disease, chronic back pain, gastroesophageal reflux disease, migraine headaches, severe peripheral vascular disease with aortic atherosclerosis and schizophrenia who presents emergency department with chest pain on the right lower rib area.  He has pain with palpation has been there for about a day.  He thought it came on because he laid on a remote control but it seems worse than would make sense to him.  He also complains of shortness of breath.  He has history of clots in his legs but no other traumas.  (I believe the patient is referring to arterial stenoses not deep venous thromboses).  Rest did not make it better and taking a deep breath makes it worse.  The pain is of moderate severity.  It is constant and worse when he takes deep breath.   Hospital Course by Problem:   Acute respiratory failure with hypoxemia: likley related to multifocal pneumonia in setting of long term smoker.  with ambulation this am oxygen saturation  level 83% with increased work of breathing. At rest 90%. Mild increased work of breathing with conversation. BS diminished throught -resolved and on day of discharge walking the units w/o desaturation  Multifocal pneumonia: Presentation seems very consistent with a viral pneumonia given that the patient has patchy infiltrates, no elevated white blood cell count, no fever. His COVID-19 rapid test was negative. Respiratory panel negative, blood cultures no growth. Initially provided rocephin and azithromycin -transition to PO abx as he is much improved on day of discharge  Peripheral vascular disease:  -Status post lower extremity bypass surgery most recently in July of this year the patient had a right to left femorofemoral artery bypass by Dr. Oneida Alar. He continues to follow-up.  Aortic atherosclerosis: On CT scan this is a new diagnosis. -follow up outpatient  Tobacco abuse: cessation counseling done  History of bilateral hip replacements: These were due to avascular necrosis.  Walks well  Does not appear to haveAcute or chronic kidney disease  -GFR is >60   Medical Consultants:      Discharge Exam:   Vitals:   12/04/18 0452 12/04/18 0842  BP: 115/85 132/84  Pulse: 70 73  Resp:  18  Temp: 98 F (36.7 C) 98.1 F (36.7 C)  SpO2: 95% 95%   Vitals:   12/03/18 2100 12/04/18 0052 12/04/18 0452 12/04/18 0842  BP: 118/73  115/85 132/84  Pulse: 78 80 70 73  Resp:    18  Temp: 98.1 F (36.7 C)  98 F (36.7 C) 98.1 F (36.7 C)  TempSrc: Oral  Oral Oral  SpO2:  92% 95% 95% 95%  Weight:      Height:        General exam: Appears calm and comfortable.   The results of significant diagnostics from this hospitalization (including imaging, microbiology, ancillary and laboratory) are listed below for reference.     Procedures and Diagnostic Studies:   Dg Ribs Unilateral W/chest Right  Result Date: 12/01/2018 CLINICAL DATA:  Pt complains of R lower, medio-lateral  rib pain. Pt states he woke up today and his ribs hurt. He thought he slept on the remote, but the pain has not gone away, and is worse with inspiration. Pt was having difficulty taking deep breaths when being x-rayed. Pt denies injury. BB marker placed for oblique images. EXAM: RIGHT RIBS AND CHEST - 3+ VIEW COMPARISON:  10/08/2013 FINDINGS: No rib fracture or rib lesion. Cardiac silhouette is normal in size. Normal mediastinal and hilar contours. Lungs are mildly hyperexpanded. There is mild interstitial prominence most evident in the right lateral lung base. No evidence of pneumonia or pulmonary edema. No pleural effusion or pneumothorax. IMPRESSION: 1. No rib fracture or rib lesion. 2. No acute cardiopulmonary disease. Electronically Signed   By: Amie Portlandavid  Ormond M.D.   On: 12/01/2018 20:31   Ct Angio Chest Pe W And/or Wo Contrast  Result Date: 12/02/2018 CLINICAL DATA:  Right-sided chest pain.  PE suspected. EXAM: CT ANGIOGRAPHY CHEST WITH CONTRAST TECHNIQUE: Multidetector CT imaging of the chest was performed using the standard protocol during bolus administration of intravenous contrast. Multiplanar CT image reconstructions and MIPs were obtained to evaluate the vascular anatomy. CONTRAST:  71mL OMNIPAQUE IOHEXOL 350 MG/ML SOLN COMPARISON:  CTA abdomen and pelvis 08/13/2018. CT stone study 11/04/2015. FINDINGS: Cardiovascular: The heart size is normal. No substantial pericardial effusion. Atherosclerotic calcification is noted in the wall of the thoracic aorta. No filling defect within the opacified pulmonary arteries to suggest the presence of an acute pulmonary embolus. Mediastinum/Nodes: No mediastinal lymphadenopathy. There is no hilar lymphadenopathy. The esophagus has normal imaging features. There is no axillary lymphadenopathy. Lungs/Pleura: Centrilobular and paraseptal emphysema evident. Patchy airspace opacity noted right lung base, suspicious for pneumonia. No pleural effusion. No overtly  suspicious nodule or mass. Upper Abdomen: 1.7 cm low-density lesion in the right kidney is stable since CT scan of 11/04/2015 consistent with benign etiology such as cyst. Musculoskeletal: No worrisome lytic or sclerotic osseous abnormality. Review of the MIP images confirms the above findings. IMPRESSION: 1. New focal airspace disease in the extreme right lung base, new since CT of 08/13/2018. Imaging features compatible with pneumonia. 2. No CT evidence for acute pulmonary embolus. 3.  Emphysema. (ICD10-J43.9) 4.  Aortic Atherosclerois (ICD10-170.0) Electronically Signed   By: Kennith CenterEric  Mansell M.D.   On: 12/02/2018 08:59     Labs:   Basic Metabolic Panel: Recent Labs  Lab 12/01/18 2005 12/02/18 1446 12/03/18 0916 12/04/18 0343  NA 136  --  139 138  K 3.7  --  3.8 4.1  CL 102  --  107 105  CO2 24  --  24 24  GLUCOSE 110*  --  185* 102*  BUN 10  --  11 10  CREATININE 1.34* 1.32* 1.32* 1.30*  CALCIUM 9.5  --  9.2 9.2   GFR Estimated Creatinine Clearance: 56.9 mL/min (A) (by C-G formula based on SCr of 1.3 mg/dL (H)). Liver Function Tests: No results for input(s): AST, ALT, ALKPHOS, BILITOT, PROT, ALBUMIN in the last 168 hours. No results for input(s): LIPASE, AMYLASE in the last 168  hours. No results for input(s): AMMONIA in the last 168 hours. Coagulation profile No results for input(s): INR, PROTIME in the last 168 hours.  CBC: Recent Labs  Lab 12/01/18 2005 12/02/18 1446 12/03/18 0916  WBC 10.0 10.9* 7.9  HGB 16.5 16.7 14.2  HCT 49.0 51.0 42.3  MCV 97.0 99.4 96.1  PLT 199 212 179   Cardiac Enzymes: No results for input(s): CKTOTAL, CKMB, CKMBINDEX, TROPONINI in the last 168 hours. BNP: Invalid input(s): POCBNP CBG: No results for input(s): GLUCAP in the last 168 hours. D-Dimer No results for input(s): DDIMER in the last 72 hours. Hgb A1c No results for input(s): HGBA1C in the last 72 hours. Lipid Profile No results for input(s): CHOL, HDL, LDLCALC, TRIG, CHOLHDL,  LDLDIRECT in the last 72 hours. Thyroid function studies No results for input(s): TSH, T4TOTAL, T3FREE, THYROIDAB in the last 72 hours.  Invalid input(s): FREET3 Anemia work up No results for input(s): VITAMINB12, FOLATE, FERRITIN, TIBC, IRON, RETICCTPCT in the last 72 hours. Microbiology Recent Results (from the past 240 hour(s))  SARS CORONAVIRUS 2 (TAT 6-24 HRS) Nasopharyngeal Nasopharyngeal Swab     Status: None   Collection Time: 12/02/18 11:28 AM   Specimen: Nasopharyngeal Swab  Result Value Ref Range Status   SARS Coronavirus 2 NEGATIVE NEGATIVE Final    Comment: (NOTE) SARS-CoV-2 target nucleic acids are NOT DETECTED. The SARS-CoV-2 RNA is generally detectable in upper and lower respiratory specimens during the acute phase of infection. Negative results do not preclude SARS-CoV-2 infection, do not rule out co-infections with other pathogens, and should not be used as the sole basis for treatment or other patient management decisions. Negative results must be combined with clinical observations, patient history, and epidemiological information. The expected result is Negative. Fact Sheet for Patients: HairSlick.no Fact Sheet for Healthcare Providers: quierodirigir.com This test is not yet approved or cleared by the [place] FDA and  has been authorized for detection and/or diagnosis of SARS-CoV-2 by FDA under an Emergency Use Authorization (EUA). This EUA will remain  in effect (meaning this test can be used) for the duration of the COVID-19 declaration under Section 56 4(b)(1) of the Act, 21 U.S.C. section 360bbb-3(b)(1), unless the authorization is terminated or revoked sooner. Performed at [facility], [address] [place], [place] [address]   Blood culture (routine x 2)     Status: None (Preliminary result)   Collection Time: 12/02/18 11:34 AM   Specimen: BLOOD  Result Value Ref Range Status    Specimen Description BLOOD LEFT ANTECUBITAL  Final   Special Requests   Final    BOTTLES DRAWN AEROBIC AND ANAEROBIC Blood Culture adequate volume   Culture   Final    NO GROWTH < 24 HOURS Performed at [facility], [address] [place], [place] [address]    Report Status PENDING  Incomplete  Blood culture (routine x 2)     Status: None (Preliminary result)   Collection Time: 12/02/18 12:27 PM   Specimen: BLOOD  Result Value Ref Range Status   Specimen Description BLOOD LEFT ANTECUBITAL  Final   Special Requests   Final    AEROBIC BOTTLE ONLY Blood Culture results may not be optimal due to an inadequate volume of blood received in culture bottles   Culture   Final    NO GROWTH < 24 HOURS Performed at [facility], [address] [place], [place] [address]    Report Status PENDING  Incomplete  Respiratory  Panel by PCR     Status: None   Collection Time: 12/02/18  2:46 PM   Specimen: Nasopharyngeal Swab; Respiratory  Result Value Ref Range Status   Adenovirus NOT DETECTED NOT DETECTED Final   Coronavirus 229E NOT DETECTED NOT DETECTED Final    Comment: (NOTE) The Coronavirus on the Respiratory Panel, DOES NOT test for the novel  Coronavirus (2019 nCoV)    Coronavirus HKU1 NOT DETECTED NOT DETECTED Final   Coronavirus NL63 NOT DETECTED NOT DETECTED Final   Coronavirus OC43 NOT DETECTED NOT DETECTED Final   Metapneumovirus NOT DETECTED NOT DETECTED Final   Rhinovirus / Enterovirus NOT DETECTED NOT DETECTED Final   Influenza A NOT DETECTED NOT DETECTED Final   Influenza B NOT DETECTED NOT DETECTED Final   Parainfluenza Virus 1 NOT DETECTED NOT DETECTED Final   Parainfluenza Virus 2 NOT DETECTED NOT DETECTED Final   Parainfluenza Virus 3 NOT DETECTED NOT DETECTED Final   Parainfluenza Virus 4 NOT DETECTED NOT DETECTED Final   Respiratory Syncytial Virus NOT DETECTED NOT DETECTED Final   Bordetella pertussis NOT DETECTED NOT DETECTED Final   Chlamydophila  pneumoniae NOT DETECTED NOT DETECTED Final   Mycoplasma pneumoniae NOT DETECTED NOT DETECTED Final    Comment: Performed at [facility], [address] [place], [place] [address]     Discharge Instructions:   Discharge Instructions    Diet general   Complete by: As directed    Discharge instructions   Complete by: As directed    Stop smoking!   Increase activity slowly   Complete by: As directed      Allergies as of 12/04/2018      Reactions   Ibuprofen Other (See Comments)   Gastric irritation   Lyrica [pregabalin] Anxiety   Tramadol Itching      Medication List    TAKE these medications   doxycycline 100 MG capsule Commonly known as: VIBRAMYCIN Take 1 capsule (100 mg total) by mouth 2 (two) times daily.   oxyCODONE-acetaminophen 10-325 MG tablet Commonly known as: PERCOCET Take 0.5-1 tablets by mouth See admin instructions. 2-3 times daily.      Follow-up Information    Fleet Contras, MD Follow up in 1 week(s).   Specialty: Internal Medicine Contact information: [address] [place] [address] [phone]            Time coordinating discharge: 25 min  Signed:  Joseph Art DO  [facility] 12/04/2018, 9:38 AM

## 2018-12-04 NOTE — Plan of Care (Signed)
  Problem: Education: Goal: Knowledge of General Education information will improve Description Including pain rating scale, medication(s)/side effects and non-pharmacologic comfort measures Outcome: Progressing   

## 2018-12-07 LAB — CULTURE, BLOOD (ROUTINE X 2)
Culture: NO GROWTH
Culture: NO GROWTH
Special Requests: ADEQUATE

## 2019-01-29 NOTE — Progress Notes (Signed)
Virtual Visit via Video Note changed to phone visit at patient request   This visit type was conducted due to national recommendations for restrictions regarding the COVID-19 Pandemic (e.g. social distancing) in an effort to limit this patient's exposure and mitigate transmission in our community.  Due to his co-morbid illnesses, this patient is at least at moderate risk for complications without adequate follow up.  This format is felt to be most appropriate for this patient at this time.  All issues noted in this document were discussed and addressed.  A limited physical exam was performed with this format.  Please refer to the patient's chart for his consent to telehealth for [facility].   Date:  02/01/2019   ID:  James Stevens, DOB 1960-06-12, MRN [id number]  Patient Location:Home Provider Location: Home  PCP:  Nolene Ebbs, MD  Cardiologist:  Dr Stanford Breed  Evaluation Performed:  Follow-Up Visit  Chief Complaint:  FU PVD  History of Present Illness:    Seen recently for peripheral vascular disease and preoperative evaluation prior to right to left femorofemoral bypass. Nuclear study July 2020 showed ejection fraction 43% with no ischemia or infarction.  Echocardiogram July 2020 showed normal LV function. CTA November 2020 showed airspace disease right lung base, no pulmonary embolus, emphysema and aortic atherosclerosis.  Since last seen patient denies dyspnea, chest pain, palpitations or syncope.  The patient does not have symptoms concerning for COVID-19 infection (fever, chills, cough, or new shortness of breath).    Past Medical History:  Diagnosis Date  . Arthritis   . Bipolar affective ([place])   . Chronic back pain    and right hip  . Depression   . GERD (gastroesophageal reflux disease)    occ tums  . Headache(784.0)    migraines  . Hip pain   . 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 Peripheral  vascular disease ([place])   . PNA (pneumonia)   . Schizophrenia ([place])   . Tobacco abuse    Past Surgical History:  Procedure Laterality Date  . ABDOMINAL AORTOGRAM W/LOWER EXTREMITY Bilateral 06/29/2018   Procedure: ABDOMINAL AORTOGRAM W/LOWER EXTREMITY;  Surgeon: Elam Dutch, MD;  Location: [facility];  Service: Cardiovascular;  Laterality: Bilateral;  . AORTA - BILATERAL FEMORAL ARTERY BYPASS GRAFT N/A 10/07/2013   Procedure: AORTA BIFEMORAL BYPASS GRAFT;  Surgeon: Elam Dutch, MD;  Location: [facility];  Service: Vascular;  Laterality: N/A;  . ENDARTERECTOMY FEMORAL Left 08/06/2018   Procedure: Left Common Femoral Endarterectomy;  Surgeon: Elam Dutch, MD;  Location: [facility];  Service: Vascular;  Laterality: Left;  . ESOPHAGEAL DILATION    . ESOPHAGOGASTRODUODENOSCOPY  09/06/2011   Procedure: ESOPHAGOGASTRODUODENOSCOPY (EGD);  Surgeon: Winfield Cunas., MD;  Location: [facility];  Service: Endoscopy;  Laterality: N/A;  . EYE SURGERY    . FEMORAL-FEMORAL BYPASS GRAFT Left 08/06/2018   Procedure: Ligation and Removal of Left Limb of Aortobifemoral Bypass Graft;  Surgeon: Elam Dutch, MD;  Location: [facility];  Service: Vascular;  Laterality: Left;  . FEMORAL-FEMORAL BYPASS GRAFT Bilateral 08/10/2018   Procedure: RIGHT TO LEFT FEMORAL TO FEMORAL ARTERY BYPASS USING 42mm X 60cm HEMASHIELD GOLD GRAFT;  Surgeon: Elam Dutch, MD;  Location: [facility];  Service: Vascular;  Laterality: Bilateral;  . FRACTURE SURGERY     left fingers # 4,5  . JOINT REPLACEMENT Bilateral    rt hip replacement, and  LT hip  .  LUMBAR LAMINECTOMY N/A 05/05/2014   Procedure: BILATERAL LUMBAR LAMINECTOMY L4-5 AND L5-S1;  Surgeon: Kerrin Champagne, MD;  [facility];  Service: Orthopedics;  Laterality: N/A;  . MANDIBLE FRACTURE SURGERY    . PATCH ANGIOPLASTY Left 08/06/2018   Procedure: Patch Angioplasty Left Common Femoral Artery with Greater Saphenous Vein;  Surgeon: Sherren Kerns, MD;  Location:  [facility];  Service: Vascular;  Laterality: Left;  . SAVORY DILATION  09/06/2011   Procedure: SAVORY DILATION;  Surgeon: Vertell Novak., MD;  Location: [facility];  Service: Endoscopy;  Laterality: N/A;  . THROMBECTOMY FEMORAL ARTERY Bilateral 08/10/2018   Procedure: FEMORAL ARTERY THROMBECTOMY;  Surgeon: Sherren Kerns, MD;  Location: [facility];  Service: Vascular;  Laterality: Bilateral;  . TOTAL HIP ARTHROPLASTY Left 04/17/2012   Procedure: LEFT TOTAL HIP ARTHROPLASTY ANTERIOR APPROACH;  Surgeon: Kathryne Hitch, MD;  Location[facility];  Service: Orthopedics;  Laterality: Left;     Current Meds  Medication Sig  . doxycycline (VIBRAMYCIN) 100 MG capsule Take 1 capsule (100 mg total) by mouth 2 (two) times daily.  Marland Kitchen oxyCODONE-acetaminophen (PERCOCET) 10-325 MG tablet Take 0.5-1 tablets by mouth See admin instructions. 2-3 times daily.     Allergies:   Ibuprofen, Lyrica [pregabalin], and Tramadol   Social History   Tobacco Use  . Smoking status: Current Every Day Smoker    Packs/day: 0.25    Years: 34.00    Pack years: 8.50    Types: Cigarettes  . Smokeless tobacco: Never Used  . Tobacco comment: 3-4 cigarettes per day  Substance Use Topics  . Alcohol use: Yes    Comment: once a week   . Drug use: Yes    Frequency: 2.0 times per week    Types: Cocaine, Marijuana     Family Hx: The patient's family history includes Diabetes in his brother, father, mother, and another family member.  ROS:   Please see the history of present illness.    No Fever, chills  or productive cough All other systems reviewed and are negative.   Recent Labs: 08/02/2018: ALT 20 12/03/2018: Hemoglobin 14.2; Platelets 179 12/04/2018: BUN 10; Creatinine, Ser 1.30; Potassium 4.1; Sodium 138   Recent Lipid Panel Lab Results  Component Value Date/Time   CHOL 182 04/02/2009 08:25 PM   TRIG 88 04/02/2009 08:25 PM   HDL 43 04/02/2009 08:25 PM   CHOLHDL 4.2 Ratio 04/02/2009 08:25 PM   LDLCALC 121  (H) 04/02/2009 08:25 PM    Wt Readings from Last 3 Encounters:  02/01/19 142 lb (64.4 kg)  12/02/18 143 lb (64.9 kg)  09/24/18 138 lb (62.6 kg)     Objective:    Vital Signs:  Ht 5\' 10"  (1.778 m)   Wt 142 lb (64.4 kg)   BMI 20.37 kg/m    VITAL SIGNS:  reviewed NAD Answers questions appropriately Normal affect Remainder of physical examination not performed (telehealth visit; coronavirus pandemic)  ASSESSMENT & PLAN:    1. Peripheral vascular disease-patient is not taking any medications at this point.  Resume aspirin 81 mg daily and statin..  Per vascular surgery. 2. Tobacco abuse-patient counseled on discontinuing. 3. History of substance abuse-patient counseled on avoiding cocaine. 4. Hyperlipidemia-resume Crestor 40 mg daily.  Check lipids and liver in 12 weeks.  COVID-19 Education: The importance of social distancing was discussed today.  Time:   Today, I have spent 14 minutes with the patient with telehealth technology discussing the above problems.     Medication  Adjustments/Labs and Tests Ordered: Current medicines are reviewed at length with the patient today.  Concerns regarding medicines are outlined above.   Tests Ordered: No orders of the defined types were placed in this encounter.   Medication Changes: No orders of the defined types were placed in this encounter.   Follow Up:  Either In Person or Virtual12 months   Signed, Olga Millers, MD  02/01/2019 10:50 AM    [facility]

## 2019-02-01 ENCOUNTER — Encounter: Payer: Self-pay | Admitting: Cardiology

## 2019-02-01 ENCOUNTER — Telehealth (INDEPENDENT_AMBULATORY_CARE_PROVIDER_SITE_OTHER): Payer: Medicaid Other | Admitting: Cardiology

## 2019-02-01 VITALS — Ht 70.0 in | Wt 142.0 lb

## 2019-02-01 DIAGNOSIS — I739 Peripheral vascular disease, unspecified: Secondary | ICD-10-CM

## 2019-02-01 DIAGNOSIS — E7800 Pure hypercholesterolemia, unspecified: Secondary | ICD-10-CM

## 2019-02-01 DIAGNOSIS — F17200 Nicotine dependence, unspecified, uncomplicated: Secondary | ICD-10-CM | POA: Diagnosis not present

## 2019-02-01 MED ORDER — ROSUVASTATIN CALCIUM 40 MG PO TABS: 40.0000 mg | ORAL_TABLET | Freq: Every day | ORAL | 3 refills | Status: DC

## 2019-02-01 MED ORDER — ASPIRIN EC 81 MG PO TBEC: 81.0000 mg | DELAYED_RELEASE_TABLET | Freq: Every day | ORAL | 3 refills | Status: AC

## 2019-02-01 NOTE — Patient Instructions (Signed)
Medication Instructions:  START ASPIRIN 81 MG ONCE DAILY  START ROSUVASTATIN 40 MG ONCE DAILY  *If you need a refill on your cardiac medications before your next appointment, please call your pharmacy*  Lab Work: Your physician recommends that you return for lab work in: 12 WEEKS PRIOR TO EATING  If you have labs (blood work) drawn today and your tests are completely normal, you will receive your results only by: Marland Kitchen MyChart Message (if you have MyChart) OR . A paper copy in the mail If you have any lab test that is abnormal or we need to change your treatment, we will call you to review the results.  Follow-Up: At [facility], you and your health needs are our priority.  As part of our continuing mission to provide you with exceptional heart care, we have created designated Provider Care Teams.  These Care Teams include your primary Cardiologist (physician) and Advanced Practice Providers (APPs -  Physician Assistants and Nurse Practitioners) who all work together to provide you with the care you need, when you need it.  Your next appointment:   12 month(s)  The format for your next appointment:   Either In Person or Virtual  Provider:   You may see Olga Millers MD or one of the following Advanced Practice Providers on your designated Care Team:    Corine Shelter, PA-C  [place], [place]  Edd Fabian, [place]

## 2019-02-25 ENCOUNTER — Telehealth (HOSPITAL_COMMUNITY): Payer: Self-pay

## 2019-02-25 NOTE — Telephone Encounter (Signed)

## 2019-02-26 ENCOUNTER — Other Ambulatory Visit: Payer: Self-pay

## 2019-02-26 ENCOUNTER — Ambulatory Visit (INDEPENDENT_AMBULATORY_CARE_PROVIDER_SITE_OTHER): Payer: Medicaid Other | Admitting: Physician Assistant

## 2019-02-26 ENCOUNTER — Ambulatory Visit (HOSPITAL_COMMUNITY)
Admission: RE | Admit: 2019-02-26 | Discharge: 2019-02-26 | Disposition: A | Discharge status: Home / Self Care | Payer: Medicaid Other | Source: Ambulatory Visit | Attending: Surgery | Admitting: Surgery

## 2019-02-26 VITALS — BP 114/79 | HR 88 | Temp 98.1°F | Resp 18 | Ht 70.0 in | Wt 143.0 lb

## 2019-02-26 DIAGNOSIS — I739 Peripheral vascular disease, unspecified: Secondary | ICD-10-CM

## 2019-02-26 DIAGNOSIS — I7409 Other arterial embolism and thrombosis of abdominal aorta: Secondary | ICD-10-CM | POA: Diagnosis not present

## 2019-02-26 NOTE — Progress Notes (Signed)
Established Previous Bypass   History of Present Illness   URI James Stevens is a 59 y.o. (July 28, 1960) male who presents to go over vascular studies related to peripheral arterial disease.  He underwent aortobifemoral bypass several years ago.  He was recently noted to have occlusion of the left limb of the graft with purulent collection adjacent to the left limb and underwent excision of the left limb, femoral endarterectomy and vein patch by Dr. Darrick Penna on 08/06/2018.  He then underwent right to left femoral to femoral bypass on 08/10/2018 by Dr. Darrick Penna and was discharged from the hospital with a PICC line for 6 weeks of vancomycin.  He returns to clinic today without any claudication rest pain, or nonhealing wounds of bilateral lower extremities.  He is taking an aspirin and a statin daily.  He is not have any trouble with groin discomfort in the area of the incisions.   The patient's PMH, PSH, SH, and FamHx were reviewed and are unchanged from prior visit.  Current Outpatient Medications  Medication Sig Dispense Refill  . aspirin EC 81 MG tablet Take 1 tablet (81 mg total) by mouth daily. 90 tablet 3  . oxyCODONE-acetaminophen (PERCOCET) 10-325 MG tablet Take 0.5-1 tablets by mouth See admin instructions. 2-3 times daily.    . rosuvastatin (CRESTOR) 40 MG tablet Take 1 tablet (40 mg total) by mouth daily. 90 tablet 3  . doxycycline (VIBRAMYCIN) 100 MG capsule Take 1 capsule (100 mg total) by mouth 2 (two) times daily. (Patient not taking: Reported on 02/26/2019) 8 capsule 0   No current facility-administered medications for this visit.   Facility-Administered Medications Ordered in Other Visits  Medication Dose Route Frequency Provider Last Rate Last Admin  . lactated ringers infusion    Continuous PRN Nils Pyle, CRNA   New Bag at 09/25/13 0740  . lactated ringers infusion    Continuous PRN Nils Pyle, CRNA   New Bag at 05/05/14 1040    On ROS today: Back pain; 10 system ROS  otherwise negative   Physical Examination   Vitals:   02/26/19 1135  BP: 114/79  Pulse: 88  Resp: 18  Temp: 98.1 F (36.7 C)  TempSrc: Temporal  SpO2: 97%  Weight: 143 lb (64.9 kg)  Height: 5\' 10"  (1.778 m)   Body mass index is 20.52 kg/m.  General Alert, O x 3, WD, NAD  Pulmonary Sym exp, good B air movt,   Cardiac RRR, Nl S1, S2  Vascular Vessel Right Left  Radial Palpable Palpable  PT Not palpable Faintly palpable  DP Not palpable Palpable    Gastro- intestinal soft, non-distended, non-tender to palpation,   Musculo- skeletal Palpable femoral to femoral bypass pulse; M/S 5/5 throughout  , Extremities without ischemic changes  , No nonhealing wounds on exam; feet symmetrically warm to touch  Neurologic Pain and light touch intact in extremities , Motor exam as listed above    ABI/TBIToday's ABIToday's TBIPrevious ABIPrevious TBI  +-------+-----------+-----------+------------+------------+  Right 0.56    0.56    0.7     0.34      +-------+-----------+-----------+------------+------------+  Left  0.95    0.83    0.95    0.75       Medical Decision Making   James Stevens is a 59 y.o. male who presents for routine surveillance status post right to left femoral to femoral bypass in July 2020   Palpable femoral to femoral bypass graft pulse  ABIs unchanged compared  to study performed 6 months prior  Encouraged continued active lifestyle with plenty of ambulation  Continue aspirin and statin daily  Recheck ABIs in 6 months   Dagoberto Ligas PA-C [facility] Office: [facility] MD: [place]

## 2019-02-27 ENCOUNTER — Other Ambulatory Visit: Payer: Self-pay | Admitting: *Deleted

## 2019-02-27 DIAGNOSIS — I739 Peripheral vascular disease, unspecified: Secondary | ICD-10-CM

## 2019-06-11 ENCOUNTER — Encounter: Payer: Self-pay | Admitting: *Deleted

## 2019-09-26 ENCOUNTER — Ambulatory Visit (HOSPITAL_COMMUNITY)
Admission: RE | Admit: 2019-09-26 | Discharge: 2019-09-26 | Disposition: A | Discharge status: Home / Self Care | Payer: Medicaid Other | Source: Ambulatory Visit | Attending: Vascular Surgery | Admitting: Vascular Surgery

## 2019-09-26 ENCOUNTER — Ambulatory Visit: Payer: Medicaid Other

## 2019-09-26 ENCOUNTER — Other Ambulatory Visit: Payer: Self-pay

## 2019-09-26 DIAGNOSIS — I739 Peripheral vascular disease, unspecified: Secondary | ICD-10-CM

## 2019-10-01 ENCOUNTER — Other Ambulatory Visit: Payer: Self-pay | Admitting: Physician Assistant

## 2019-10-01 ENCOUNTER — Ambulatory Visit (INDEPENDENT_AMBULATORY_CARE_PROVIDER_SITE_OTHER): Payer: Medicaid Other | Admitting: Physician Assistant

## 2019-10-01 ENCOUNTER — Other Ambulatory Visit: Payer: Self-pay

## 2019-10-01 VITALS — Ht 70.0 in | Wt 147.0 lb

## 2019-10-01 DIAGNOSIS — I739 Peripheral vascular disease, unspecified: Secondary | ICD-10-CM | POA: Diagnosis not present

## 2019-10-01 DIAGNOSIS — F17200 Nicotine dependence, unspecified, uncomplicated: Secondary | ICD-10-CM | POA: Diagnosis not present

## 2019-10-01 MED ORDER — ROSUVASTATIN CALCIUM 40 MG PO TABS: 40.0000 mg | ORAL_TABLET | Freq: Every day | ORAL | 1 refills | Status: DC

## 2019-10-01 NOTE — Progress Notes (Signed)
HISTORY AND PHYSICAL     CC:  follow up. Requesting Provider:  Fleet Contras, MD  HPI: This is a 59 y.o. male who is here today for follow up for PAD.  He has hx of aortobifemoral bypass several years ago.  He was recently noted to have occlusion of the left limb of the graft with purulent collection adjacent to the left limb and underwent excision of the left limb, femoral endarterectomy and vein patch by Dr. Darrick Penna on 08/06/2018.  He then underwent right to left femoral to femoral bypass on 08/10/2018 by Dr. Darrick Penna and was discharged from the hospital with a PICC line for 6 weeks of vancomycin.   Pt was last seen 02/26/2019 and at that time, he was not having any claudication, rest pain or non healing wounds of either leg.  He was not having any trouble with groin discomfort in the area of incisions.   His femoral to femoral bypass was patent and ABI's were unchanged from previous exam.  He was scheduled for 6 month  Follow up.    The pt request telephone visit for today's follow up since it is raining and his legs are painful when it rains.  Two identifiers were used to identify pt.  He states that he has been having some right leg pain over the past 3 weeks when walking on an incline or going up steps.  He states that walking on even ground is fine.  Upon talking to him a little bit more, he states that he gets cramping in his right calf after walking about 3-4 minutes.  He stops to rest and it improves and he continues another 3-4 minutes before he has this again. He does not have any non healing wounds on his feet.  He does not have rest pain at night, but does have cramping in the right foot at night where he has to stand and pivot his foot on the floor. He does have pins and needles feeling in his right foot a couple times per week when he says he sleeps wrong.  He states that he feels his fem fem bypass every morning and it continues to have a pulse in it.  He says it has become habit to feel  this.  He states his groin incisions are fine and do not have any drainage.  He has not had any fever or chills, chest pain or shortness of breath or stroke sx.  He states that he has significantly decreased his smoking.  One cigarette may last him all day.    The pt is on a statin for cholesterol management.    The pt is on an aspirin.    Other AC:  none The pt is not on medication for hypertension.  The pt does not have diabetes. Tobacco hx:  1-2 cigarettes per day    Past Medical History:  Diagnosis Date  . Arthritis   . Bipolar affective (HCC)   . Chronic back pain    and right hip  . Depression   . GERD (gastroesophageal reflux disease)    occ tums  . Headache(784.0)    migraines  . Hip pain   . 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 Peripheral vascular disease (HCC)   . PNA (pneumonia)   . Schizophrenia (HCC)   . Tobacco abuse     Past Surgical History:  Procedure Laterality Date  . ABDOMINAL AORTOGRAM W/LOWER  EXTREMITY Bilateral 06/29/2018   Procedure: ABDOMINAL AORTOGRAM W/LOWER EXTREMITY;  Surgeon: Sherren Kerns, MD;  Location[facility] INVASIVE CV LAB;  Service: Cardiovascular;  Laterality: Bilateral;  . AORTA - BILATERAL FEMORAL ARTERY BYPASS GRAFT N/A 10/07/2013   Procedure: AORTA BIFEMORAL BYPASS GRAFT;  Surgeon: Sherren Kerns, MD;  Location: [facility] OR;  Service: Vascular;  Laterality: N/A;  . ENDARTERECTOMY FEMORAL Left 08/06/2018   Procedure: Left Common Femoral Endarterectomy;  Surgeon: Sherren Kerns, MD;  Location: [facility] OR;  Service: Vascular;  Laterality: Left;  . ESOPHAGEAL DILATION    . ESOPHAGOGASTRODUODENOSCOPY  09/06/2011   Procedure: ESOPHAGOGASTRODUODENOSCOPY (EGD);  Surgeon: Vertell Novak., MD;  Location: [facility] ENDOSCOPY;  Service: Endoscopy;  Laterality: N/A;  . EYE SURGERY    . FEMORAL-FEMORAL BYPASS GRAFT Left 08/06/2018   Procedure: Ligation and Removal of Left Limb of Aortobifemoral  Bypass Graft;  Surgeon: Sherren Kerns, MD;  Location: [facility] OR;  Service: Vascular;  Laterality: Left;  . FEMORAL-FEMORAL BYPASS GRAFT Bilateral 08/10/2018   Procedure: RIGHT TO LEFT FEMORAL TO FEMORAL ARTERY BYPASS USING 87mm X 60cm HEMASHIELD GOLD GRAFT;  Surgeon: Sherren Kerns, MD;  Location: [facility] OR;  Service: Vascular;  Laterality: Bilateral;  . FRACTURE SURGERY     left fingers # 4,5  . JOINT REPLACEMENT Bilateral    rt hip replacement, and  LT hip  . LUMBAR LAMINECTOMY N/A 05/05/2014   Procedure: BILATERAL LUMBAR LAMINECTOMY L4-5 AND L5-S1;  Surgeon: Kerrin Champagne, MD;  Location: [facility] OR;  Service: Orthopedics;  Laterality: N/A;  . MANDIBLE FRACTURE SURGERY    . PATCH ANGIOPLASTY Left 08/06/2018   Procedure: Patch Angioplasty Left Common Femoral Artery with Greater Saphenous Vein;  Surgeon: Sherren Kerns, MD;  Location: [facility] OR;  Service: Vascular;  Laterality: Left;  . SAVORY DILATION  09/06/2011   Procedure: SAVORY DILATION;  Surgeon: Vertell Novak., MD;  Location: [facility]OSCOPY;  Service: Endoscopy;  Laterality: N/A;  . THROMBECTOMY FEMORAL ARTERY Bilateral 08/10/2018   Procedure: FEMORAL ARTERY THROMBECTOMY;  Surgeon: Sherren Kerns, MD;  Location: [facility] OR;  Service: Vascular;  Laterality: Bilateral;  . TOTAL HIP ARTHROPLASTY Left 04/17/2012   Procedure: LEFT TOTAL HIP ARTHROPLASTY ANTERIOR APPROACH;  Surgeon: Kathryne Hitch, MD;  Location: [facility] OR;  Service: Orthopedics;  Laterality: Left;    Allergies  Allergen Reactions  . Ibuprofen Other (See Comments)    Gastric irritation  . Lyrica [Pregabalin] Anxiety  . Tramadol Itching    Current Outpatient Medications  Medication Sig Dispense Refill  . aspirin EC 81 MG tablet Take 1 tablet (81 mg total) by mouth daily. 90 tablet 3  . doxycycline (VIBRAMYCIN) 100 MG capsule Take 1 capsule (100 mg total) by mouth 2 (two) times daily. (Patient not taking: Reported on 02/26/2019) 8 capsule 0  . oxyCODONE-acetaminophen (PERCOCET)  10-325 MG tablet Take 0.5-1 tablets by mouth See admin instructions. 2-3 times daily.    . rosuvastatin (CRESTOR) 40 MG tablet Take 1 tablet (40 mg total) by mouth daily. 90 tablet 3   No current facility-administered medications for this visit.   Facility-Administered Medications Ordered in Other Visits  Medication Dose Route Frequency Provider Last Rate Last Admin  . lactated ringers infusion    Continuous PRN Nils Pyle, CRNA   New Bag at 09/25/13 0740  . lactated ringers infusion    Continuous PRN Nils Pyle, CRNA   New Bag at 05/05/14 1040    Family History  Problem Relation Age  of Onset  . Diabetes Mother   . Diabetes Father   . Diabetes Other   . Diabetes Brother     Social History   Socioeconomic History  . Marital status: Divorced    Spouse name: Not on file  . Number of children: 1  . Years of education: Not on file  . Highest education level: Not on file  Occupational History  . Not on file  Tobacco Use  . Smoking status: Current Every Day Smoker    Packs/day: 0.25    Years: 34.00    Pack years: 8.50    Types: Cigarettes  . Smokeless tobacco: Never Used  . Tobacco comment: 3-4 cigarettes per day  Vaping Use  . Vaping Use: Never used  Substance and Sexual Activity  . Alcohol use: Yes    Comment: once a week   . Drug use: Yes    Frequency: 2.0 times per week    Types: Cocaine, Marijuana  . Sexual activity: Not on file  Other Topics Concern  . Not on file  Social History Narrative  . Not on file   Social Determinants of Health   Financial Resource Strain:   . Difficulty of Paying Living Expenses: Not on file  Food Insecurity:   . Worried About RunninProgramme researcher, broadcasting/film/videog Out of Food in the Last Year: Not on file  . Ran Out of Food in the Last Year: Not on file  Transportation Needs:   . Lack of Transportation (Medical): Not on file  . Lack of Transportation (Non-Medical): Not on file  Physical Activity:   . Days of Exercise per Week: Not on file  . Minutes of  Exercise per Session: Not on file  Stress:   . Feeling of Stress : Not on file  Social Connections:   . Frequency of Communication with Friends and Family: Not on file  . Frequency of Social Gatherings with Friends and Family: Not on file  . Attends Religious Services: Not on file  . Active Member of Clubs or Organizations: Not on file  . Attends BankerClub or Organization Meetings: Not on file  . Marital Status: Not on file  Intimate Partner Violence:   . Fear of Current or Ex-Partner: Not on file  . Emotionally Abused: Not on file  . Physically Abused: Not on file  . Sexually Abused: Not on file     REVIEW OF SYSTEMS:  12 system ROS was negative unless otherwise noted in HPI   Observation/Objective:   Non-Invasive Vascular Imaging:   ABI's/TBI's on 09/26/2019: Right:  0.62/0.59 - Great toe pressure: 77 Left:  0.85/0.72 - Great toe pressure: 93   Previous ABI's/TBI's on 02/26/2019: Right:  0.56/0.62 (M) - Great toe pressure: 89 Left:  0.95/0.83 - Great toe pressure:  120  Previous arterial duplex on 09/06/2018: Right Graft #1: Right to Left Femoro-femoral bypass graft  +------------------+--------+--------+----------+--------+           PSV cm/sStenosisWaveform Comments  +------------------+--------+--------+----------+--------+  Inflow      66       monophasic      +------------------+--------+--------+----------+--------+  Prox Anastomosis 50       monophasic      +------------------+--------+--------+----------+--------+  Proximal Graft  34       biphasic       +------------------+--------+--------+----------+--------+  Mid Graft     35       biphasic       +------------------+--------+--------+----------+--------+  Distal Graft   33       monophasic      +------------------+--------+--------+----------+--------+  Distal Anastomosis39        monophasic      +------------------+--------+--------+----------+--------+  Outflow      160       triphasic        Summary:  Right: Patent right to left femoro-femoral bypass graft without evidence  of stenosis.    ASSESSMENT/PLAN:: 59 y.o. male here for follow up for aortobifemoral bypass several years ago.  He was recently noted to have occlusion of the left limb of the graft with purulent collection adjacent to the left limb and underwent excision of the left limb, femoral endarterectomy and vein patch by Dr. Darrick Penna on 08/06/2018.  He then underwent right to left femoral to femoral bypass on 08/10/2018 by Dr. Darrick Penna  -pt having increased claudication of the right leg and increasing pain with elevation or steps.  His ABI's were essentially unchanged from previous exam.  He did not have an u/s of his fem fem bypass graft this visit, but he does check the pulse daily and he does have a pulse.   -will bring pt back for duplex of fem fem bypass and aorto-iliac (right) duplex and see Dr. Darrick Penna in the next 2-4 weeks.  Pt understands that if he develops any wounds or new rest pain that he should call us sooner.  -pt has cut down on his smoking significantly and I praised him for it and encouraged him to continue to try to quit.   -pt is out of his statin.  I discussed with him that I would send rx for 30 days plus one refill and he would need to have his labs checked with his PCP and refills per PCP.   Doreatha Massed, [facility] [phone]  Clinic MD:   Edilia Bo on call MD

## 2019-10-02 ENCOUNTER — Other Ambulatory Visit: Payer: Self-pay | Admitting: *Deleted

## 2019-10-02 DIAGNOSIS — I739 Peripheral vascular disease, unspecified: Secondary | ICD-10-CM

## 2019-10-02 DIAGNOSIS — I7409 Other arterial embolism and thrombosis of abdominal aorta: Secondary | ICD-10-CM

## 2019-10-31 ENCOUNTER — Ambulatory Visit (HOSPITAL_COMMUNITY): Payer: Medicaid Other | Attending: Physician Assistant

## 2019-10-31 ENCOUNTER — Ambulatory Visit: Payer: Medicaid Other | Admitting: Vascular Surgery

## 2019-10-31 ENCOUNTER — Ambulatory Visit (HOSPITAL_COMMUNITY): Payer: Medicaid Other

## 2020-04-08 ENCOUNTER — Ambulatory Visit: Payer: Medicaid Other | Attending: Student

## 2021-03-31 ENCOUNTER — Other Ambulatory Visit: Payer: Self-pay | Admitting: Nurse Practitioner

## 2021-03-31 DIAGNOSIS — M5416 Radiculopathy, lumbar region: Secondary | ICD-10-CM

## 2021-04-11 ENCOUNTER — Ambulatory Visit
Admission: RE | Admit: 2021-04-11 | Discharge: 2021-04-11 | Disposition: A | Discharge status: Home / Self Care | Payer: Medicaid Other | Source: Ambulatory Visit | Attending: Nurse Practitioner | Admitting: Nurse Practitioner

## 2021-04-11 DIAGNOSIS — M5416 Radiculopathy, lumbar region: Secondary | ICD-10-CM

## 2021-07-21 ENCOUNTER — Ambulatory Visit: Payer: Medicaid Other | Admitting: Student in an Organized Health Care Education/Training Program

## 2021-08-16 ENCOUNTER — Encounter: Payer: Self-pay | Admitting: Student in an Organized Health Care Education/Training Program

## 2023-01-24 ENCOUNTER — Emergency Department (HOSPITAL_COMMUNITY)
Admission: EM | Admit: 2023-01-24 | Discharge: 2023-01-25 | Discharge status: Against Medical Advice | Payer: MEDICAID | Attending: Emergency Medicine | Admitting: Emergency Medicine

## 2023-01-24 ENCOUNTER — Other Ambulatory Visit: Payer: Self-pay

## 2023-01-24 ENCOUNTER — Encounter (HOSPITAL_COMMUNITY): Payer: Self-pay | Admitting: Pharmacy Technician

## 2023-01-24 DIAGNOSIS — M545 Low back pain, unspecified: Secondary | ICD-10-CM | POA: Insufficient documentation

## 2023-01-24 DIAGNOSIS — Z5321 Procedure and treatment not carried out due to patient leaving prior to being seen by health care provider: Secondary | ICD-10-CM | POA: Diagnosis not present

## 2023-01-24 LAB — URINALYSIS, ROUTINE W REFLEX MICROSCOPIC
Bacteria, UA: NONE SEEN
Bilirubin Urine: NEGATIVE
Glucose, UA: NEGATIVE mg/dL
Ketones, ur: NEGATIVE mg/dL
Leukocytes,Ua: NEGATIVE
Nitrite: NEGATIVE
Protein, ur: NEGATIVE mg/dL
Specific Gravity, Urine: 1.017 (ref 1.005–1.030)
pH: 5 (ref 5.0–8.0)

## 2023-01-24 NOTE — ED Notes (Signed)
 Pt called for vitals to be reassessed 3x. No response.

## 2023-01-24 NOTE — ED Notes (Signed)
 Called patient x3 for vital update

## 2023-01-24 NOTE — ED Triage Notes (Signed)
 Pt here with lower back pain. Hx screws in his back, states this pain is new. Has tried anything OTC for relief. Pt denies dysuria.

## 2023-07-12 ENCOUNTER — Other Ambulatory Visit: Payer: Self-pay | Admitting: *Deleted

## 2023-07-12 DIAGNOSIS — I739 Peripheral vascular disease, unspecified: Secondary | ICD-10-CM

## 2023-07-12 DIAGNOSIS — I7409 Other arterial embolism and thrombosis of abdominal aorta: Secondary | ICD-10-CM

## 2023-07-26 ENCOUNTER — Ambulatory Visit (HOSPITAL_COMMUNITY): Payer: MEDICAID

## 2023-07-26 ENCOUNTER — Ambulatory Visit: Payer: MEDICAID | Admitting: Vascular Surgery

## 2023-08-07 ENCOUNTER — Ambulatory Visit (HOSPITAL_BASED_OUTPATIENT_CLINIC_OR_DEPARTMENT_OTHER): Payer: MEDICAID | Admitting: Orthopaedic Surgery

## 2023-08-30 ENCOUNTER — Ambulatory Visit: Payer: MEDICAID | Admitting: Vascular Surgery

## 2023-08-30 ENCOUNTER — Ambulatory Visit (HOSPITAL_COMMUNITY)
Admission: RE | Admit: 2023-08-30 | Discharge: 2023-08-30 | Disposition: A | Discharge status: Home / Self Care | Payer: MEDICAID | Source: Ambulatory Visit | Attending: Vascular Surgery | Admitting: Vascular Surgery

## 2023-08-30 ENCOUNTER — Encounter: Payer: Self-pay | Admitting: Vascular Surgery

## 2023-08-30 VITALS — BP 125/68 | HR 74 | Temp 97.9°F | Resp 20 | Ht 70.0 in | Wt 132.0 lb

## 2023-08-30 DIAGNOSIS — I70213 Atherosclerosis of native arteries of extremities with intermittent claudication, bilateral legs: Secondary | ICD-10-CM | POA: Insufficient documentation

## 2023-08-30 DIAGNOSIS — Z95828 Presence of other vascular implants and grafts: Secondary | ICD-10-CM | POA: Diagnosis not present

## 2023-08-30 DIAGNOSIS — I7409 Other arterial embolism and thrombosis of abdominal aorta: Secondary | ICD-10-CM | POA: Diagnosis present

## 2023-08-30 DIAGNOSIS — I739 Peripheral vascular disease, unspecified: Secondary | ICD-10-CM | POA: Diagnosis present

## 2023-08-30 LAB — VAS US ABI WITH/WO TBI
Left ABI: 0.63
Right ABI: 0.75

## 2023-08-30 NOTE — Progress Notes (Signed)
 Patient ID: Toribio JINNY Lemmings, male   DOB: 11/22/1960, 63 y.o.   MRN: [id number]  Reason for Consult: New Patient (Initial Visit)   Referred by Alec House, MD  Subjective:     HPI:  LYNDAL REGGIO is a 63 y.o. male here for follow-up of left lower extremity right greater than left pain.  He states that he has pains occasionally when he is in bed which are relieved by walking.  He does have cramping when he is walking as well this is not as pronounced.  He denies any tissue loss or ulceration.  He has a history of aortobifemoral bypass with subsequent excision of the left limb and right to left femorofemoral bypass last performed by Dr. Harvey just over 5 years ago.  Last follow-up in our office was 4 years ago.  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   Peripheral vascular disease (HCC)    PNA (pneumonia)    Schizophrenia (HCC)    Tobacco abuse    Family History  Problem Relation Age of Onset   Diabetes Mother    Diabetes Father    Diabetes Other    Diabetes Brother    Past Surgical History:  Procedure Laterality Date   ABDOMINAL AORTOGRAM W/LOWER EXTREMITY Bilateral 06/29/2018   Procedure: ABDOMINAL AORTOGRAM W/LOWER EXTREMITY;  Surgeon: Harvey Carlin BRAVO, MD;  Location: [facility];  Service: Cardiovascular;  Laterality: Bilateral;   AORTA - BILATERAL FEMORAL ARTERY BYPASS GRAFT N/A 10/07/2013   Procedure: AORTA BIFEMORAL BYPASS GRAFT;  Surgeon: Carlin BRAVO Harvey, MD;  Location: [facility] OR;  Service: Vascular;  Laterality: N/A;   ENDARTERECTOMY FEMORAL Left 08/06/2018   Procedure: Left Common Femoral Endarterectomy;  Surgeon: Harvey Carlin BRAVO, MD;  Location: [facility] OR;  Service: Vascular;   Laterality: Left;   ESOPHAGEAL DILATION     ESOPHAGOGASTRODUODENOSCOPY  09/06/2011   Procedure: ESOPHAGOGASTRODUODENOSCOPY (EGD);  Surgeon: Lynwood LITTIE Celestia Mickey., MD;  Location: [facility];  Service: Endoscopy;  Laterality: N/A;   EYE SURGERY     FEMORAL-FEMORAL BYPASS GRAFT Left 08/06/2018   Procedure: Ligation and Removal of Left Limb of Aortobifemoral Bypass Graft;  Surgeon: Harvey Carlin BRAVO, MD;  Location: [facility] OR;  Service: Vascular;  Laterality: Left;   FEMORAL-FEMORAL BYPASS GRAFT Bilateral 08/10/2018   Procedure: RIGHT TO LEFT FEMORAL TO FEMORAL ARTERY BYPASS USING 8mm X 60cm HEMASHIELD GOLD GRAFT;  Surgeon: Harvey Carlin BRAVO, MD;  Location: [facility] OR;  Service: Vascular;  Laterality: Bilateral;   FRACTURE SURGERY     left fingers # 4,5   JOINT REPLACEMENT Bilateral    rt hip replacement, and  LT hip   LUMBAR LAMINECTOMY N/A 05/05/2014   Procedure: BILATERAL LUMBAR LAMINECTOMY L4-5 AND L5-S1;  Surgeon: Lynwood BRAVO Better, MD;  Location: [facility] OR;  Service: Orthopedics;  Laterality: N/A;   MANDIBLE FRACTURE SURGERY     PATCH ANGIOPLASTY Left 08/06/2018   Procedure: Patch Angioplasty Left Common Femoral Artery with Greater Saphenous Vein;  Surgeon: Harvey Carlin BRAVO, MD;  Location: [facility] OR;  Service: Vascular;  Laterality: Left;   SAVORY DILATION  09/06/2011   Procedure: SAVORY DILATION;  Surgeon: Lynwood LITTIE Celestia Mickey., MD;  Location: [facility];  Service: Endoscopy;  Laterality: N/A;   THROMBECTOMY FEMORAL ARTERY Bilateral 08/10/2018   Procedure: FEMORAL ARTERY THROMBECTOMY;  Surgeon: Harvey Carlin BRAVO, MD;  Location: [facility] OR;  Service: Vascular;  Laterality: Bilateral;   TOTAL HIP ARTHROPLASTY Left 04/17/2012   Procedure: LEFT TOTAL HIP ARTHROPLASTY ANTERIOR APPROACH;  Surgeon: Lonni CINDERELLA Poli, MD;  Location: [facility] OR;  Service: Orthopedics;  Laterality: Left;    Short Social History:  Social History   Tobacco Use   Smoking status: Every Day    Current packs/day: 0.25    Average packs/day: 0.3  packs/day for 34.0 years (8.5 ttl pk-yrs)    Types: Cigarettes   Smokeless tobacco: Never   Tobacco comments:    3-4 cigarettes per day  Substance Use Topics   Alcohol use: Yes    Comment: once a week     Allergies  Allergen Reactions   Ibuprofen Other (See Comments)    Gastric irritation   Lyrica [Pregabalin] Anxiety   Tramadol Itching    Current Outpatient Medications  Medication Sig Dispense Refill   aspirin  EC 81 MG tablet Take 1 tablet (81 mg total) by mouth daily. 90 tablet 3   oxyCODONE  (OXY IR/ROXICODONE ) 5 MG immediate release tablet Take 5 mg by mouth every 6 (six) hours as needed.     oxyCODONE -acetaminophen  (PERCOCET ) 10-325 MG tablet Take 0.5-1 tablets by mouth See admin instructions. 2-3 times daily.     rosuvastatin  (CRESTOR ) 40 MG tablet TAKE 1 TABLET(40 MG) BY MOUTH DAILY 90 tablet 1   traZODone  (DESYREL ) 100 MG tablet Take 100 mg by mouth at bedtime.     gabapentin  (NEURONTIN ) 100 MG capsule Take 100 mg by mouth 2 (two) times daily. (Patient not taking: Reported on 08/30/2023)     No current facility-administered medications for this visit.   Facility-Administered Medications Ordered in Other Visits  Medication Dose Route Frequency Provider Last Rate Last Admin   lactated ringers  infusion    Continuous PRN Carolee Lauraine DASEN, CRNA   New Bag at 09/25/13 0740   lactated ringers  infusion    Continuous PRN Carolee Lauraine DASEN, CRNA   New Bag at 05/05/14 1040    Review of Systems  Constitutional:  Constitutional negative. HENT: HENT negative.  Eyes: Eyes negative.  Respiratory: Respiratory negative.  Cardiovascular: Positive for claudication. Negative for leg swelling.  GI: Gastrointestinal negative.  Musculoskeletal: Positive for leg pain.  Neurological: Neurological negative. Hematologic: Hematologic/lymphatic negative.  Psychiatric: Psychiatric negative.        Objective:  Objective   Vitals:   08/30/23 0859  BP: 125/68  Pulse: 74  Resp: 20  Temp: 97.9 F  (36.6 C)  TempSrc: Temporal  SpO2: 96%  Weight: 132 lb (59.9 kg)  Height: 5' 10 (1.778 m)   Body mass index is 18.94 kg/m.  Physical Exam Cardiovascular:     Rate and Rhythm: Normal rate.     Pulses:          Femoral pulses are 2+ on the right side and 2+ on the left side.      Popliteal pulses are 0 on the right side and 0 on the left side.     Comments: Fem-fem bypass palpable pulse Abdominal:     General: Abdomen is flat.  Musculoskeletal:     Cervical back: Normal range of motion.     Right lower leg: No edema.     Left lower leg: No edema.  Skin:    Capillary Refill: Capillary refill takes less than 2 seconds.  Neurological:     General: No focal deficit present.     Mental Status: He is alert.     Data: ABI Findings:  +---------+------------------+-----+----------+--------+  Right   Rt Pressure (mmHg)IndexWaveform  Comment   +---------+------------------+-----+----------+--------+  Brachial 153                                        +---------+------------------+-----+----------+--------+  PTA     120               0.75 monophasic          +---------+------------------+-----+----------+--------+  DP      84                0.52 monophasic          +---------+------------------+-----+----------+--------+  Great Toe84                0.52 Abnormal            +---------+------------------+-----+----------+--------+   +---------+------------------+-----+----------+-------+  Left    Lt Pressure (mmHg)IndexWaveform  Comment  +---------+------------------+-----+----------+-------+  Brachial 160                                       +---------+------------------+-----+----------+-------+  PTA     95                0.59 monophasic         +---------+------------------+-----+----------+-------+  DP      101               0.63 monophasic         +---------+------------------+-----+----------+-------+  Great Toe66                 0.41 Abnormal           +---------+------------------+-----+----------+-------+   +-------+-----------+-----------+------------+------------+  ABI/TBIToday's ABIToday's TBIPrevious ABIPrevious TBI  +-------+-----------+-----------+------------+------------+  Right 0.75       0.53       0.62        0.59          +-------+-----------+-----------+------------+------------+  Left  0.63       0.41       0.85        0.72          +-------+-----------+-----------+------------+------------+      Right ABIs appear essentially unchanged. Left ABIs and TBIs appear  decreased.    Summary:  Right: Resting right ankle-brachial index indicates moderate right lower  extremity arterial disease. The right toe-brachial index is abnormal.   Left: Resting left ankle-brachial index indicates moderate left lower  extremity arterial disease. The left toe-brachial index is abnormal.      Assessment/Plan:     63 year old male with history as above with moderately depressed ABIs bilaterally with pain in his lower extremities which is likely multifactorial.  We discussed the need for total smoking cessation.  Given his difficult past we have discussed that he would be difficult to revascularize and would not want any further procedures unless absolutely necessary.  As such I will set him a follow-up with femorofemoral duplex and repeat ABIs in 6 months.  He needs to protect his feet and again avoid all tobacco products and he demonstrates good understanding of this.  Penne Lonni Colorado MD [facility]

## 2023-08-31 ENCOUNTER — Other Ambulatory Visit: Payer: Self-pay

## 2023-08-31 DIAGNOSIS — Z95828 Presence of other vascular implants and grafts: Secondary | ICD-10-CM

## 2023-08-31 DIAGNOSIS — I70213 Atherosclerosis of native arteries of extremities with intermittent claudication, bilateral legs: Secondary | ICD-10-CM

## 2023-09-15 ENCOUNTER — Encounter: Payer: MEDICAID | Admitting: Vascular Surgery

## 2023-09-15 ENCOUNTER — Encounter (HOSPITAL_COMMUNITY): Payer: MEDICAID

## 2023-10-04 ENCOUNTER — Telehealth: Payer: Self-pay

## 2023-10-04 NOTE — Telephone Encounter (Signed)
 Returned pt's call after he left VM on triage line. He did not answer/unable to leave message.

## 2024-01-25 ENCOUNTER — Ambulatory Visit: Admission: RE | Admit: 2024-01-25 | Discharge: 2024-01-25 | Disposition: A | Payer: MEDICAID | Source: Ambulatory Visit

## 2024-01-25 ENCOUNTER — Other Ambulatory Visit: Payer: Self-pay

## 2024-01-25 DIAGNOSIS — M5416 Radiculopathy, lumbar region: Secondary | ICD-10-CM

## 2024-02-28 ENCOUNTER — Encounter (HOSPITAL_COMMUNITY): Payer: MEDICAID

## 2024-02-28 ENCOUNTER — Ambulatory Visit: Payer: MEDICAID
